# Patient Record
Sex: Female | Born: 1935 | Race: Black or African American | Hispanic: No | Smoking: Never smoker
Health system: Southern US, Community
[De-identification: ages and names within clinical notes are randomized; demographics above are authoritative.]

## PROBLEM LIST (undated history)

## (undated) DIAGNOSIS — I10 Essential (primary) hypertension: Secondary | ICD-10-CM

## (undated) DIAGNOSIS — E119 Type 2 diabetes mellitus without complications: Secondary | ICD-10-CM

## (undated) DIAGNOSIS — F0390 Unspecified dementia without behavioral disturbance: Secondary | ICD-10-CM

## (undated) DIAGNOSIS — J449 Chronic obstructive pulmonary disease, unspecified: Secondary | ICD-10-CM

## (undated) SURGERY — EGD (ESOPHAGOGASTRODUODENOSCOPY)
Anesthesia: General

---

## 2011-05-24 ENCOUNTER — Emergency Department (HOSPITAL_COMMUNITY): Payer: Medicare Other

## 2011-05-24 ENCOUNTER — Emergency Department (HOSPITAL_COMMUNITY)
Admission: EM | Admit: 2011-05-24 | Discharge: 2011-05-25 | Disposition: A | Discharge status: Home / Self Care | Payer: Medicare Other | Attending: Emergency Medicine | Admitting: Emergency Medicine

## 2011-05-24 DIAGNOSIS — F29 Unspecified psychosis not due to a substance or known physiological condition: Secondary | ICD-10-CM | POA: Insufficient documentation

## 2011-05-24 DIAGNOSIS — IMO0002 Reserved for concepts with insufficient information to code with codable children: Secondary | ICD-10-CM | POA: Insufficient documentation

## 2011-05-24 DIAGNOSIS — F0390 Unspecified dementia without behavioral disturbance: Secondary | ICD-10-CM | POA: Insufficient documentation

## 2011-05-24 DIAGNOSIS — H5316 Psychophysical visual disturbances: Secondary | ICD-10-CM | POA: Insufficient documentation

## 2011-05-25 LAB — CBC
Platelets: 218 10*3/uL (ref 150–400)
RBC: 4.46 MIL/uL (ref 3.87–5.11)
RDW: 14 % (ref 11.5–15.5)
WBC: 6.7 10*3/uL (ref 4.0–10.5)

## 2011-05-25 LAB — RAPID URINE DRUG SCREEN, HOSP PERFORMED
Amphetamines: NOT DETECTED
Barbiturates: NOT DETECTED
Benzodiazepines: NOT DETECTED
Cocaine: NOT DETECTED
Opiates: NOT DETECTED
Tetrahydrocannabinol: NOT DETECTED

## 2011-05-25 LAB — DIFFERENTIAL
Basophils Absolute: 0 10*3/uL (ref 0.0–0.1)
Basophils Relative: 0 % (ref 0–1)
Eosinophils Absolute: 0.1 10*3/uL (ref 0.0–0.7)
Eosinophils Relative: 1 % (ref 0–5)
Lymphocytes Relative: 21 % (ref 12–46)
Lymphs Abs: 1.4 10*3/uL (ref 0.7–4.0)
Monocytes Absolute: 0.7 10*3/uL (ref 0.1–1.0)
Monocytes Relative: 11 % (ref 3–12)
Neutro Abs: 4.5 10*3/uL (ref 1.7–7.7)
Neutrophils Relative %: 67 % (ref 43–77)

## 2011-05-25 LAB — COMPREHENSIVE METABOLIC PANEL
ALT: 30 U/L (ref 0–35)
AST: 30 U/L (ref 0–37)
Albumin: 3.4 g/dL — ABNORMAL LOW (ref 3.5–5.2)
CO2: 28 mEq/L (ref 19–32)
Calcium: 9.3 mg/dL (ref 8.4–10.5)
Chloride: 107 mEq/L (ref 96–112)
GFR calc non Af Amer: >60 mL/min (ref >60)
Sodium: 142 mEq/L (ref 135–145)
Total Bilirubin: 0.1 mg/dL — ABNORMAL LOW (ref 0.3–1.2)

## 2011-05-25 LAB — URINALYSIS, ROUTINE W REFLEX MICROSCOPIC
Bilirubin Urine: NEGATIVE
Glucose, UA: NEGATIVE mg/dL
Hgb urine dipstick: NEGATIVE
Ketones, ur: NEGATIVE mg/dL
Leukocytes, UA: NEGATIVE
Nitrite: NEGATIVE
Protein, ur: NEGATIVE mg/dL
Specific Gravity, Urine: 1.013 (ref 1.005–1.030)
Urobilinogen, UA: 0.2 mg/dL (ref 0.0–1.0)
pH: 7.5 (ref 5.0–8.0)

## 2011-05-25 LAB — ETHANOL: Alcohol, Ethyl (B): <11 mg/dL (ref 0–11)

## 2011-09-20 ENCOUNTER — Emergency Department: Payer: Self-pay | Admitting: *Deleted

## 2011-09-20 LAB — COMPREHENSIVE METABOLIC PANEL
Albumin: 3.3 g/dL — ABNORMAL LOW (ref 3.4–5.0)
Anion Gap: 9 (ref 7–16)
Bilirubin,Total: 0.3 mg/dL (ref 0.2–1.0)
Creatinine: 0.72 mg/dL (ref 0.60–1.30)
EGFR (African American): >60
Potassium: 3.6 mmol/L (ref 3.5–5.1)
SGOT(AST): 29 U/L (ref 15–37)
SGPT (ALT): 19 U/L
Sodium: 144 mmol/L (ref 136–145)
Total Protein: 6.6 g/dL (ref 6.4–8.2)

## 2011-09-20 LAB — ACETAMINOPHEN LEVEL: Acetaminophen: <2 ug/mL

## 2011-09-20 LAB — SALICYLATE LEVEL: Salicylates, Serum: 2.3 mg/dL

## 2011-09-20 LAB — DRUG SCREEN, URINE
Barbiturates, Ur Screen: NEGATIVE (ref ?–200)
Cannabinoid 50 Ng, Ur ~~LOC~~: NEGATIVE (ref ?–50)
Phencyclidine (PCP) Ur S: NEGATIVE (ref ?–25)

## 2011-09-20 LAB — URINALYSIS, COMPLETE
Ketone: NEGATIVE
Protein: NEGATIVE
Specific Gravity: 1.013 (ref 1.003–1.030)
WBC UR: 8 /HPF (ref 0–5)

## 2011-09-20 LAB — CBC
HCT: 41.4 % (ref 35.0–47.0)
HGB: 13.9 g/dL (ref 12.0–16.0)
MCH: 28.5 pg (ref 26.0–34.0)
MCV: 85 fL (ref 80–100)
RBC: 4.87 10*6/uL (ref 3.80–5.20)

## 2011-09-20 LAB — TSH: Thyroid Stimulating Horm: 2.31 u[IU]/mL

## 2013-01-07 DIAGNOSIS — R350 Frequency of micturition: Secondary | ICD-10-CM | POA: Insufficient documentation

## 2013-04-05 ENCOUNTER — Emergency Department: Payer: Self-pay | Admitting: Emergency Medicine

## 2013-04-05 LAB — URINALYSIS, COMPLETE
Blood: NEGATIVE
Nitrite: NEGATIVE
WBC UR: 3 /HPF (ref 0–5)

## 2013-04-05 LAB — CBC
HGB: 13.6 g/dL (ref 12.0–16.0)
MCHC: 34.2 g/dL (ref 32.0–36.0)
MCV: 82 fL (ref 80–100)
WBC: 7.4 10*3/uL (ref 3.6–11.0)

## 2013-04-05 LAB — TROPONIN I: Troponin-I: <0.02 ng/mL

## 2013-04-05 LAB — BASIC METABOLIC PANEL
Anion Gap: 5 — ABNORMAL LOW (ref 7–16)
BUN: 11 mg/dL (ref 7–18)
Calcium, Total: 8.7 mg/dL (ref 8.5–10.1)
Co2: 29 mmol/L (ref 21–32)
EGFR (African American): >60
EGFR (Non-African Amer.): >60
Glucose: 90 mg/dL (ref 65–99)
Sodium: 141 mmol/L (ref 136–145)

## 2014-02-02 ENCOUNTER — Emergency Department: Payer: Self-pay | Admitting: Emergency Medicine

## 2014-02-02 LAB — URINALYSIS, COMPLETE
BILIRUBIN, UR: NEGATIVE
BLOOD: NEGATIVE
GLUCOSE, UR: NEGATIVE mg/dL (ref 0–75)
KETONE: NEGATIVE
Leukocyte Esterase: NEGATIVE
NITRITE: NEGATIVE
PH: 8 (ref 4.5–8.0)
PROTEIN: NEGATIVE
RBC,UR: <1 /HPF (ref 0–5)
SPECIFIC GRAVITY: 1.004 (ref 1.003–1.030)
WBC UR: <1 /HPF (ref 0–5)

## 2014-02-02 LAB — TROPONIN I

## 2014-02-02 LAB — COMPREHENSIVE METABOLIC PANEL
ALK PHOS: 116 U/L
ALT: 22 U/L (ref 12–78)
ANION GAP: 8 (ref 7–16)
Albumin: 3.7 g/dL (ref 3.4–5.0)
BILIRUBIN TOTAL: 0.3 mg/dL (ref 0.2–1.0)
BUN: 11 mg/dL (ref 7–18)
CHLORIDE: 107 mmol/L (ref 98–107)
Calcium, Total: 9.2 mg/dL (ref 8.5–10.1)
Co2: 27 mmol/L (ref 21–32)
Creatinine: 1 mg/dL (ref 0.60–1.30)
EGFR (African American): >60
EGFR (Non-African Amer.): 53 — ABNORMAL LOW
GLUCOSE: 91 mg/dL (ref 65–99)
OSMOLALITY: 282 (ref 275–301)
Potassium: 4 mmol/L (ref 3.5–5.1)
SGOT(AST): 32 U/L (ref 15–37)
SODIUM: 142 mmol/L (ref 136–145)
TOTAL PROTEIN: 7.4 g/dL (ref 6.4–8.2)

## 2014-02-02 LAB — CBC
HCT: 43.6 % (ref 35.0–47.0)
HGB: 14.1 g/dL (ref 12.0–16.0)
MCH: 27.2 pg (ref 26.0–34.0)
MCHC: 32.3 g/dL (ref 32.0–36.0)
MCV: 84 fL (ref 80–100)
PLATELETS: 527 10*3/uL — AB (ref 150–440)
RBC: 5.19 10*6/uL (ref 3.80–5.20)
RDW: 15.1 % — ABNORMAL HIGH (ref 11.5–14.5)
WBC: 6.5 10*3/uL (ref 3.6–11.0)

## 2014-02-02 LAB — LIPASE, BLOOD: LIPASE: 141 U/L (ref 73–393)

## 2014-02-06 DIAGNOSIS — E119 Type 2 diabetes mellitus without complications: Secondary | ICD-10-CM | POA: Insufficient documentation

## 2014-02-06 DIAGNOSIS — F0390 Unspecified dementia without behavioral disturbance: Secondary | ICD-10-CM | POA: Insufficient documentation

## 2014-05-31 ENCOUNTER — Emergency Department: Payer: Self-pay | Admitting: Emergency Medicine

## 2014-05-31 LAB — URINALYSIS, COMPLETE
BACTERIA: NONE SEEN
BILIRUBIN, UR: NEGATIVE
BLOOD: NEGATIVE
GLUCOSE, UR: NEGATIVE mg/dL (ref 0–75)
KETONE: NEGATIVE
Leukocyte Esterase: NEGATIVE
Nitrite: NEGATIVE
PH: 6 (ref 4.5–8.0)
PROTEIN: NEGATIVE
SPECIFIC GRAVITY: 1.006 (ref 1.003–1.030)
WBC UR: 2 /HPF (ref 0–5)

## 2014-05-31 LAB — COMPREHENSIVE METABOLIC PANEL
ALK PHOS: 137 U/L — AB
ALT: 29 U/L
ANION GAP: 5 — AB (ref 7–16)
Albumin: 3.1 g/dL — ABNORMAL LOW (ref 3.4–5.0)
BUN: 12 mg/dL (ref 7–18)
Bilirubin,Total: 0.2 mg/dL (ref 0.2–1.0)
CO2: 29 mmol/L (ref 21–32)
Calcium, Total: 8.5 mg/dL (ref 8.5–10.1)
Chloride: 107 mmol/L (ref 98–107)
Creatinine: 1.07 mg/dL (ref 0.60–1.30)
EGFR (African American): >60
GFR CALC NON AF AMER: 52 — AB
Glucose: 100 mg/dL — ABNORMAL HIGH (ref 65–99)
Osmolality: 281 (ref 275–301)
POTASSIUM: 3.9 mmol/L (ref 3.5–5.1)
SGOT(AST): 31 U/L (ref 15–37)
Sodium: 141 mmol/L (ref 136–145)
TOTAL PROTEIN: 7 g/dL (ref 6.4–8.2)

## 2014-05-31 LAB — CBC
HCT: 42 % (ref 35.0–47.0)
HGB: 13.2 g/dL (ref 12.0–16.0)
MCH: 27 pg (ref 26.0–34.0)
MCHC: 31.6 g/dL — AB (ref 32.0–36.0)
MCV: 86 fL (ref 80–100)
Platelet: 498 10*3/uL — ABNORMAL HIGH (ref 150–440)
RBC: 4.91 10*6/uL (ref 3.80–5.20)
RDW: 14.9 % — ABNORMAL HIGH (ref 11.5–14.5)
WBC: 7 10*3/uL (ref 3.6–11.0)

## 2014-05-31 LAB — TSH: THYROID STIMULATING HORM: 0.881 u[IU]/mL

## 2014-05-31 LAB — TROPONIN I

## 2014-10-11 ENCOUNTER — Emergency Department: Payer: Self-pay | Admitting: Emergency Medicine

## 2014-12-21 NOTE — Consult Note (Signed)
PATIENT NAME:  Savannah Young, Savannah Young MR#:  [id number] DATE OF BIRTH:  [birth year]  DATE OF CONSULTATION:  09/21/2011  REFERRING PHYSICIAN:   CONSULTING PHYSICIAN:  Drue Stager. Wyolene Weimann, MD  ADDENDUM: Continuation of dictation.  PAST MEDICAL HISTORY: Usual childhood illnesses; however, there is some concern about the patient's capacity to provide history.   LABS/STUDIES: Chest PA and lateral: Chronic obstructive pulmonary disease.    She is undergoing urinalysis, cultures and sensitivities.  Urine drug screen is negative. TSH is normal. Aspirin is negative. WBC is negative. Ethanol is negative. Complete metabolic panel is unremarkable.   Tylenol is negative.   REVIEW OF SYSTEMS: Constitutional, head, eyes, ears, nose, throat, mouth, neurologic, psychiatric, cardiovascular, respiratory, gastrointestinal, genitourinary, skin, musculoskeletal, hematologic, lymphatic, endocrine, and metabolic are all unremarkable.   PHYSICAL EXAMINATION: Savannah Young is an elderly female sitting up in her hospital chair with no abnormal involuntary movements.  VITAL SIGNS: Temperature 96, pulse 98, respiratory rate 18, blood pressure 124/73.   GENERAL APPEARANCE: Well-developed, well-nourished elderly female with normal body habitus. No deformities. Good attention to grooming. Her skeletal tone is normal. The gait station is normal.   Attention span is mildly decreased. Concentration is mildly decreased. Eye contact is intermittent. She has difficulty with orientation. She does not know where she is. She does not the year or the month. She is oriented to person. On memory testing, she cannot recall the year or the month which is consistent with her orientation difficulty. Her speech does involve normal rate, volume, articulation, and prosody. Her fund of knowledge, use of language, and intelligence are below that of normal. Her mood and affect are mildly irritated with some guarding regarding the undersigned's motive for  questioning. Thought process is of normal rate. She does have coherence, however, there is alogia regarding the hallucinations. She has no tangents. Abstraction is questionable. On thought content, she has the delusional system as described above. She has no thoughts of harming herself or others. Her insight is poor. Judgment is impaired. Mood is irritable. Affect is guarded.  ASSESSMENT:   AXIS I:  1. Psychotic disorder not otherwise specified. This could be due to dementia which would be the code of 293.81 2. Dementia not otherwise specified.   My recommendation is we will pursue a basic general medical reversible central nervous system workup to include RPR, folic acid, and possible additional imaging.   However, I would emphasize that a standing antipsychotic will resolve the acute symptoms potentially and would start Zyprexa at 5 mg p.o. at bedtime.   AXIS II: Deferred.   AXIS III: See past medical history.   AXIS IV: General, medical, primary support group.   AXIS V: 88.   Given Savannah Young's psychosis which impairs her judgment, she would be at risk for lethal passive self neglect.   Therefore, she requires admission to a psychiatric hospital.   We will initiate Zyprexa 5 mg daily for antipsychosis.       With her limited memory, milieu and group psychotherapy will be attempted.   Anticipate movement to an assisted living facility for the demented once the psychosis is ameliorated.  ____________________________ Drue Stager. Parisa Pinela, MD jsw:slb D: 09/21/2011 23:38:45 ET T: 09/22/2011 10:46:10 ET JOB#: [id number]  cc: Drue Stager. Hazelgrace Bonham, MD, <Dictator> Billie Ruddy MD ELECTRONICALLY SIGNED 09/23/2011 19:28

## 2014-12-21 NOTE — Consult Note (Signed)
PATIENT NAME:  Savannah Young, Savannah Young MR#:  [id number] DATE OF BIRTH:  [birth year]  DATE OF CONSULTATION:  09/21/2011  REFERRING PHYSICIAN:  Robb Matar, MD  CONSULTING PHYSICIAN:  Drue Stager. Manley, MD  CHIEF COMPLAINT AND REASON FOR CONSULTATION: Psychosis.   HISTORY OF PRESENT ILLNESS: Savannah Young is a 81 year old female who presents with the chief complaint communicated by other personnel that she is having difficulty with memory and psychosis.   For approximately eight weeks, she has developed a progressive bizarre systematic delusion that involves the next door neighbors putting cameras extensively throughout her house that are controlled by a computer. She states that these cameras have even infiltrated her bathtub. She reported that she saw two young men in her kitchen. She was brought to the Emergency Room for further evaluation and treatment. There are no known precipitating stresses, organic or otherwise. She was not on any known psychotropic medications and there is no prior history of psychosis.   The psychotic syndrome has progressed and has resulted in concomitant anxiety as well as anger, however, there are no thoughts of harming herself or others.    Savannah Young was brought to the Emergency Department via the [facility] after her family was forced to take IVC papers out on her due to the hallucinations and hostility that was occurring at home. The patient's version was that she called police regarding the two men she had seen in her kitchen. She is a poor historian, however, she is redirectable back to her Emergency Department room and bathroom.   PAST PSYCHIATRIC HISTORY: There is no record of any mental difficulty and Savannah Young denies any history of psychiatric disturbances such as elevated mood, racing thoughts, increased energy, or severe depression. She denies any history of being psychiatrically hospitalized or undergoing psychotropic medication. No prior mood  disturbances. No history of elevated mood. No history of psychotic symptoms such as hallucinations or delusions. The patient does have a history of progressive short-term and recall memory.   FAMILY PSYCHIATRIC HISTORY: None known.  SOCIAL HISTORY: Savannah Young is from [place], [place]. 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 She never had children but married a very supportive husband. She is retired as an Building control surveyor. She did not have children but raised her niece. She rarely drinks alcohol, only on a supportive specification and drinks very little in the form of wine.     ____________________________ Drue Stager. Burnis Halling, MD jsw:drc D: 09/21/2011 23:17:51 ET T: 09/22/2011 10:12:51 ET JOB#: [id number]  cc: Drue Stager. Brianna Esson, MD, <Dictator> Billie Ruddy MD ELECTRONICALLY SIGNED 09/23/2011 19:28

## 2015-02-06 DIAGNOSIS — N3281 Overactive bladder: Secondary | ICD-10-CM | POA: Insufficient documentation

## 2016-01-20 ENCOUNTER — Emergency Department: Payer: Medicare Other

## 2016-01-20 ENCOUNTER — Inpatient Hospital Stay
Admission: EM | Admit: 2016-01-20 | Discharge: 2016-01-21 | DRG: 057 | Disposition: A | Discharge status: Home / Self Care | Payer: Medicare Other | Attending: Internal Medicine | Admitting: Internal Medicine

## 2016-01-20 DIAGNOSIS — R7989 Other specified abnormal findings of blood chemistry: Secondary | ICD-10-CM

## 2016-01-20 DIAGNOSIS — G309 Alzheimer's disease, unspecified: Secondary | ICD-10-CM | POA: Diagnosis present

## 2016-01-20 DIAGNOSIS — E876 Hypokalemia: Secondary | ICD-10-CM | POA: Diagnosis present

## 2016-01-20 DIAGNOSIS — R778 Other specified abnormalities of plasma proteins: Secondary | ICD-10-CM

## 2016-01-20 DIAGNOSIS — Z79899 Other long term (current) drug therapy: Secondary | ICD-10-CM | POA: Diagnosis not present

## 2016-01-20 DIAGNOSIS — R4182 Altered mental status, unspecified: Secondary | ICD-10-CM | POA: Diagnosis present

## 2016-01-20 DIAGNOSIS — D75839 Thrombocytosis, unspecified: Secondary | ICD-10-CM

## 2016-01-20 DIAGNOSIS — F0280 Dementia in other diseases classified elsewhere without behavioral disturbance: Secondary | ICD-10-CM | POA: Diagnosis present

## 2016-01-20 DIAGNOSIS — I10 Essential (primary) hypertension: Secondary | ICD-10-CM | POA: Diagnosis present

## 2016-01-20 DIAGNOSIS — J449 Chronic obstructive pulmonary disease, unspecified: Secondary | ICD-10-CM | POA: Diagnosis present

## 2016-01-20 DIAGNOSIS — E119 Type 2 diabetes mellitus without complications: Secondary | ICD-10-CM | POA: Diagnosis present

## 2016-01-20 DIAGNOSIS — F0391 Unspecified dementia with behavioral disturbance: Secondary | ICD-10-CM

## 2016-01-20 DIAGNOSIS — D473 Essential (hemorrhagic) thrombocythemia: Secondary | ICD-10-CM

## 2016-01-20 DIAGNOSIS — R748 Abnormal levels of other serum enzymes: Secondary | ICD-10-CM | POA: Diagnosis present

## 2016-01-20 DIAGNOSIS — D7589 Other specified diseases of blood and blood-forming organs: Secondary | ICD-10-CM | POA: Diagnosis present

## 2016-01-20 DIAGNOSIS — R451 Restlessness and agitation: Secondary | ICD-10-CM | POA: Diagnosis present

## 2016-01-20 HISTORY — DX: Essential (primary) hypertension: I10

## 2016-01-20 HISTORY — DX: Unspecified dementia, unspecified severity, without behavioral disturbance, psychotic disturbance, mood disturbance, and anxiety: F03.90

## 2016-01-20 HISTORY — DX: Type 2 diabetes mellitus without complications: E11.9

## 2016-01-20 HISTORY — DX: Chronic obstructive pulmonary disease, unspecified: J44.9

## 2016-01-20 LAB — CBC WITH DIFFERENTIAL/PLATELET
BASOS PCT: 1 %
Basophils Absolute: 0.1 10*3/uL (ref 0–0.1)
Eosinophils Absolute: 0 10*3/uL (ref 0–0.7)
Eosinophils Relative: 1 %
HEMATOCRIT: 38.7 % (ref 35.0–47.0)
HEMOGLOBIN: 13 g/dL (ref 12.0–16.0)
LYMPHS ABS: 1.6 10*3/uL (ref 1.0–3.6)
Lymphocytes Relative: 24 %
MCH: 27.1 pg (ref 26.0–34.0)
MCHC: 33.6 g/dL (ref 32.0–36.0)
MCV: 80.6 fL (ref 80.0–100.0)
MONOS PCT: 9 %
Monocytes Absolute: 0.6 10*3/uL (ref 0.2–0.9)
NEUTROS ABS: 4.3 10*3/uL (ref 1.4–6.5)
NEUTROS PCT: 65 %
Platelets: 828 10*3/uL — ABNORMAL HIGH (ref 150–440)
RBC: 4.81 MIL/uL (ref 3.80–5.20)
RDW: 15.4 % — ABNORMAL HIGH (ref 11.5–14.5)
WBC: 6.6 10*3/uL (ref 3.6–11.0)

## 2016-01-20 LAB — COMPREHENSIVE METABOLIC PANEL
ALBUMIN: 3.8 g/dL (ref 3.5–5.0)
ALK PHOS: 101 U/L (ref 38–126)
ALT: 16 U/L (ref 14–54)
ANION GAP: 9 (ref 5–15)
AST: 26 U/L (ref 15–41)
BILIRUBIN TOTAL: 0.2 mg/dL — AB (ref 0.3–1.2)
BUN: 11 mg/dL (ref 6–20)
CALCIUM: 9.1 mg/dL (ref 8.9–10.3)
CO2: 25 mmol/L (ref 22–32)
CREATININE: 0.88 mg/dL (ref 0.44–1.00)
Chloride: 107 mmol/L (ref 101–111)
GFR calc Af Amer: >60 mL/min (ref >60)
GFR calc non Af Amer: 60 mL/min — ABNORMAL LOW (ref >60)
GLUCOSE: 101 mg/dL — AB (ref 65–99)
Potassium: 3.8 mmol/L (ref 3.5–5.1)
SODIUM: 141 mmol/L (ref 135–145)
TOTAL PROTEIN: 6.7 g/dL (ref 6.5–8.1)

## 2016-01-20 LAB — MRSA PCR SCREENING: MRSA by PCR: NEGATIVE

## 2016-01-20 LAB — TROPONIN I
Troponin I: 0.25 ng/mL — ABNORMAL HIGH (ref <0.031)
Troponin I: <0.03 ng/mL (ref <0.031)

## 2016-01-20 LAB — TSH: TSH: 1.249 u[IU]/mL (ref 0.350–4.500)

## 2016-01-20 LAB — CK: Total CK: 129 U/L (ref 38–234)

## 2016-01-20 MED ORDER — MEMANTINE HCL ER 14 MG PO CP24
28.0000 mg | ORAL_CAPSULE | Freq: Every day | ORAL | Status: DC
  Administered 2016-01-20: 28 mg via ORAL
  Filled 2016-01-20: qty 1
  Filled 2016-01-20: qty 2

## 2016-01-20 MED ORDER — LORAZEPAM 1 MG PO TABS
1.0000 mg | ORAL_TABLET | Freq: Once | ORAL | Status: AC
  Administered 2016-01-20: 1 mg via ORAL
  Filled 2016-01-20: qty 1

## 2016-01-20 MED ORDER — LORATADINE 10 MG PO TABS
10.0000 mg | ORAL_TABLET | Freq: Every day | ORAL | Status: DC
  Administered 2016-01-21: 10 mg via ORAL
  Filled 2016-01-20: qty 1

## 2016-01-20 MED ORDER — HALOPERIDOL LACTATE 5 MG/ML IJ SOLN
2.0000 mg | Freq: Once | INTRAMUSCULAR | Status: AC
  Administered 2016-01-20: 2 mg via INTRAVENOUS
  Filled 2016-01-20: qty 1

## 2016-01-20 MED ORDER — LORAZEPAM 0.5 MG PO TABS
0.5000 mg | ORAL_TABLET | Freq: Every day | ORAL | Status: DC
  Administered 2016-01-20: 0.5 mg via ORAL
  Filled 2016-01-20: qty 1

## 2016-01-20 MED ORDER — ONDANSETRON HCL 4 MG PO TABS: 4.0000 mg | ORAL_TABLET | Freq: Four times a day (QID) | ORAL | Status: DC | PRN

## 2016-01-20 MED ORDER — SODIUM CHLORIDE 0.9 % IV SOLN
INTRAVENOUS | Status: DC
  Administered 2016-01-20: 20:00:00 via INTRAVENOUS

## 2016-01-20 MED ORDER — PERPHENAZINE 4 MG PO TABS
4.0000 mg | ORAL_TABLET | Freq: Every day | ORAL | Status: DC
  Administered 2016-01-20: 4 mg via ORAL
  Filled 2016-01-20 (×2): qty 1

## 2016-01-20 MED ORDER — ENOXAPARIN SODIUM 40 MG/0.4ML ~~LOC~~ SOLN
40.0000 mg | SUBCUTANEOUS | Status: DC
  Administered 2016-01-20: 40 mg via SUBCUTANEOUS
  Filled 2016-01-20: qty 0.4

## 2016-01-20 MED ORDER — CRANBERRY 425 MG PO CAPS: 425.0000 mg | ORAL_CAPSULE | Freq: Every day | ORAL | Status: DC

## 2016-01-20 MED ORDER — ACETAMINOPHEN 500 MG PO TABS: 500.0000 mg | ORAL_TABLET | Freq: Three times a day (TID) | ORAL | Status: DC | PRN

## 2016-01-20 MED ORDER — ONDANSETRON HCL 4 MG/2ML IJ SOLN: 4.0000 mg | Freq: Four times a day (QID) | INTRAMUSCULAR | Status: DC | PRN

## 2016-01-20 MED ORDER — GUAIFENESIN 100 MG/5ML PO SYRP
200.0000 mg | ORAL_SOLUTION | Freq: Three times a day (TID) | ORAL | Status: DC | PRN
  Filled 2016-01-20: qty 10

## 2016-01-20 MED ORDER — DONEPEZIL HCL 5 MG PO TABS
10.0000 mg | ORAL_TABLET | Freq: Every day | ORAL | Status: DC
  Administered 2016-01-20: 10 mg via ORAL
  Filled 2016-01-20: qty 2

## 2016-01-20 MED ORDER — DIPHENHYDRAMINE HCL 25 MG PO CAPS: 25.0000 mg | ORAL_CAPSULE | ORAL | Status: DC | PRN

## 2016-01-20 MED ORDER — ENOXAPARIN SODIUM 80 MG/0.8ML ~~LOC~~ SOLN
1.0000 mg/kg | Freq: Once | SUBCUTANEOUS | Status: AC
  Administered 2016-01-20: 65 mg via SUBCUTANEOUS
  Filled 2016-01-20: qty 0.8

## 2016-01-20 MED ORDER — DOCUSATE SODIUM 100 MG PO CAPS
100.0000 mg | ORAL_CAPSULE | Freq: Two times a day (BID) | ORAL | Status: DC
  Administered 2016-01-20: 100 mg via ORAL
  Filled 2016-01-20: qty 1

## 2016-01-20 MED ORDER — RISPERIDONE 0.5 MG PO TBDP
0.5000 mg | ORAL_TABLET | Freq: Two times a day (BID) | ORAL | Status: DC | PRN
  Filled 2016-01-20: qty 1

## 2016-01-20 MED ORDER — SODIUM CHLORIDE 0.9 % IV SOLN
Freq: Once | INTRAVENOUS | Status: AC
  Administered 2016-01-20: 18:00:00 via INTRAVENOUS

## 2016-01-20 MED ORDER — VITAMIN D 1000 UNITS PO TABS
2000.0000 [IU] | ORAL_TABLET | Freq: Every day | ORAL | Status: DC
  Administered 2016-01-21: 2000 [IU] via ORAL
  Filled 2016-01-20: qty 2

## 2016-01-20 MED ORDER — ASPIRIN 81 MG PO CHEW
324.0000 mg | CHEWABLE_TABLET | Freq: Once | ORAL | Status: AC
  Administered 2016-01-20: 324 mg via ORAL
  Filled 2016-01-20: qty 4

## 2016-01-20 MED ORDER — VITAMIN B-12 1000 MCG PO TABS
500.0000 ug | ORAL_TABLET | Freq: Every day | ORAL | Status: DC
  Administered 2016-01-21: 500 ug via ORAL
  Filled 2016-01-20: qty 1

## 2016-01-20 NOTE — Progress Notes (Signed)
Spoke with patient family Ms. Worth. Updated on plan of care. Verbalized understanding. Family requested MD to review patient platelet levels. Will pass information on. Patient becoming impulsive and agitated. MD notified. New orders placed. Medication given. Safety sitter now present. Will continue to monitor.

## 2016-01-20 NOTE — ED Provider Notes (Addendum)
[facility] Emergency Department Provider Note        Time seen: ----------------------------------------- 3:57 PM on 01/20/2016 -----------------------------------------    I have reviewed the triage vital signs and the nursing notes.   HISTORY  Chief Complaint Altered Mental Status    HPI Savannah Young is a 82 y.o. female who presents to ER for evaluation of combative behavior. Patient is in the dementia unit at [facility]. She just finished antibiotics for UTI. Staff reports patient is at a baseline dementia but she's never had combative behavior before. She is alert and oriented to person but disoriented to time and place. She denies any complaints currently.   Past Medical History  Diagnosis Date  . Dementia     There are no active problems to display for this patient.   History reviewed. No pertinent past surgical history.  Allergies Review of patient's allergies indicates not on file.  Social History Social History  Substance Use Topics  . Smoking status: Never Smoker   . Smokeless tobacco: None  . Alcohol Use: No    Review of Systems Unknown, reported combative behavior ____________________________________________   PHYSICAL EXAM:  VITAL SIGNS: ED Triage Vitals  Enc Vitals Group     BP 01/20/16 1553 152/70 mmHg     Pulse Rate 01/20/16 1553 85     Resp 01/20/16 1553 18     Temp 01/20/16 1553 98.2 F (36.8 C)     Temp Source 01/20/16 1553 Oral     SpO2 01/20/16 1553 98 %     Weight 01/20/16 1553 140 lb (63.504 kg)     Height --      Head Cir --      Peak Flow --      Pain Score --      Pain Loc --      Pain Edu? --      Excl. in [place]? --     Constitutional: Alert But disoriented. No acute distress Eyes: Conjunctivae are normal. Normal extraocular movements. ENT   Head: Normocephalic and atraumatic.   Nose: No congestion/rhinnorhea.   Mouth/Throat: Mucous membranes are moist.    Neck: No stridor. Cardiovascular: Normal rate, regular rhythm. No murmurs, rubs, or gallops. Respiratory: Normal respiratory effort without tachypnea nor retractions. Breath sounds are clear and equal bilaterally. No wheezes/rales/rhonchi. Gastrointestinal: Soft and nontender. Normal bowel sounds Musculoskeletal: Nontender with normal range of motion in all extremities. No lower extremity tenderness nor edema. Neurologic:  Normal speech and language, but patient does exhibit confusion. No gross focal neurologic deficits are appreciated.  Skin:  Skin is warm, dry and intact. No rash noted. ____________________________________________  EKG: Interpreted by me.normal sinus rhythm with sinus arrhythmia, rate is 71 bpm, normal PR interval, normal QRS,normal QT interval. Normaxis.  ____________________________________________  ED COURSE:  Pertinent labs & imaging results that were available during my care of the patient were reviewed by me and considered in my medical decision making (see chart for details). Patient presents to the ER with altered mental status. We will check basic labs and reevaluate. ____________________________________________    LABS (pertinent positives/negatives)  Labs Reviewed  CBC WITH DIFFERENTIAL/PLATELET - Abnormal; Notable for the following:    RDW 15.4 (*)    Platelets 828 (*)    All other components within normal limits  COMPREHENSIVE METABOLIC PANEL - Abnormal; Notable for the following:    Glucose, Bld 101 (*)    Total Bilirubin 0.2 (*)    GFR calc  non Af Amer 60 (*)    All other components within normal limits  TROPONIN I - Abnormal; Notable for the following:    Troponin I 0.25 (*)    All other components within normal limits  URINALYSIS COMPLETEWITH MICROSCOPIC ([facility] ONLY)   CRITICAL CARE Performed by: Earleen Newport   Total critical care time: 30 minutes  Critical care time was exclusive of separately billable procedures and treating  other patients.  Critical care was necessary to treat or prevent imminent or life-threatening deterioration.  Critical care was time spent personally by me on the following activities: development of treatment plan with patient and/or surrogate as well as nursing, discussions with consultants, evaluation of patient's response to treatment, examination of patient, obtaining history from patient or surrogate, ordering and performing treatments and interventions, ordering and review of laboratory studies, ordering and review of radiographic studies, pulse oximetry and re-evaluation of patient's condition.   RADIOLOGY Images were viewed by me  CT head, chest x-ray  IMPRESSION: 1. No evidence of acute intracranial abnormality. 2. Generalized cerebral volume loss and mild chronic small vessel Ischemia. IMPRESSION: Mild scarring versus atelectasis at the left lung base. Otherwise no active disease in the chest. ____________________________________________  FINAL ASSESSMENT AND PLAN  Elevated troponin, dementia, thrombocytosis  Plan: Patient with labs and imaging as dictated above. Patient presents the ER for change in behavior. We have discovered an elevated troponin of 0.25as well as elevated platelet levels. I ordered aspirin and Lovenox for her and given a saline bolus for the elevated platelet levels. She is not complaining of any chest pain currently, I'll recommend observation and serial troponins as well as a recheck of her platelets.   Earleen Newport, MD   Note: This dictation was prepared with Dragon dictation. Any transcriptional errors that result from this process are unintentional   Earleen Newport, MD 01/20/16 1719  Earleen Newport, MD 01/20/16 [phone]

## 2016-01-20 NOTE — H&P (Signed)
Savannah Young at [place] NAME: Savannah Young    MR#:  [id number]  DATE OF BIRTH:  Mar 16, 1932  DATE OF ADMISSION:  01/20/2016  PRIMARY CARE PHYSICIAN: No primary care provider on file.   REQUESTING/REFERRING PHYSICIAN: Dr. Lenise Arena  CHIEF COMPLAINT:   Chief Complaint  Patient presents with  . Altered Mental Status    HISTORY OF PRESENT ILLNESS:  Savannah Young  is a 82 y.o. female with a known history of Alzheimer's dementia, hypertension and allergies presents from assisted living facility secondary to worsening agitation. No history obtained from patient due to her dementia. Most of the history is obtained from ER physician and also old records. Apparently patient at baseline is ambulatory, confused but not agitated. She has been very combative today and so was sent to the emergency room. No recorded fever, no nausea or vomiting. Patient is currently confused, in bed but not combative. She was recently treated with Bactrim for urinary tract infection. Repeat urinalysis is pending at this time. Her troponin is elevated at 0.25. She is being admitted for further monitoring.  PAST MEDICAL HISTORY:   Past Medical History  Diagnosis Date  . Dementia   . Hypertension   . Diabetes mellitus ([place])   . COPD (chronic obstructive pulmonary disease) ([place])     PAST SURGICAL HISTORY:  History reviewed. No pertinent past surgical history.  No surgical history known  SOCIAL HISTORY:   Social History  Substance Use Topics  . Smoking status: Never Smoker   . Smokeless tobacco: Not on file  . Alcohol Use: No    FAMILY HISTORY:  No family history on file.  No known family history- not obtained due to patients dementia and no family available  DRUG ALLERGIES:  No Known Allergies  REVIEW OF SYSTEMS:   Review of Systems  Unable to perform ROS: dementia    MEDICATIONS AT HOME:   Prior to Admission medications   Medication  Sig Start Date End Date Taking? Authorizing Provider  acetaminophen (TYLENOL) 500 MG tablet Take 500 mg by mouth 3 (three) times daily as needed for mild pain.   Yes Historical Provider, MD  albuterol (PROVENTIL HFA;VENTOLIN HFA) 108 (90 Base) MCG/ACT inhaler Inhale 2 puffs into the lungs every 4 (four) hours as needed for wheezing or shortness of breath.   Yes Historical Provider, MD  cholecalciferol (VITAMIN D) 1000 units tablet Take 2,000 Units by mouth daily.   Yes Historical Provider, MD  Cranberry 425 MG CAPS Take 425 mg by mouth daily.   Yes Historical Provider, MD  cyanocobalamin 500 MCG tablet Take 500 mcg by mouth daily.   Yes Historical Provider, MD  diphenhydrAMINE (BENADRYL) 25 mg capsule Take 25 mg by mouth every 4 (four) hours as needed for itching.   Yes Historical Provider, MD  donepezil (ARICEPT) 10 MG tablet Take 10 mg by mouth at bedtime.   Yes Historical Provider, MD  fexofenadine (ALLEGRA) 180 MG tablet Take 180 mg by mouth daily.   Yes Historical Provider, MD  guaifenesin (ROBITUSSIN) 100 MG/5ML syrup Take 200 mg by mouth 3 (three) times daily as needed for cough.   Yes Historical Provider, MD  LORazepam (ATIVAN) 0.5 MG tablet Take 0.5 mg by mouth at bedtime. Pt is also able to take twice daily as needed for anxiety.   Yes Historical Provider, MD  memantine (NAMENDA XR) 28 MG CP24 24 hr capsule Take 28 mg by mouth at bedtime.   Yes  Historical Provider, MD  perphenazine (TRILAFON) 4 MG tablet Take 4 mg by mouth at bedtime.   Yes Historical Provider, MD      VITAL SIGNS:  Blood pressure 139/92, pulse 78, temperature 98.2 F (36.8 C), temperature source Oral, resp. rate 17, weight 63.504 kg (140 lb), last menstrual period 01/20/2016, SpO2 98 %.  PHYSICAL EXAMINATION:   Physical Exam  GENERAL:  82 y.o.-year-old patient lying in the bed with no acute distress.  EYES: Pupils equal, round, reactive to light and accommodation. No scleral icterus. Extraocular muscles intact.   HEENT: Head atraumatic, normocephalic. Oropharynx and nasopharynx clear.  NECK:  Supple, no jugular venous distention. No thyroid enlargement, no tenderness.  LUNGS: Normal breath sounds bilaterally, no wheezing, rales,rhonchi or crepitation. No use of accessory muscles of respiration.  CARDIOVASCULAR: S1, S2 normal. No rubs, or gallops. 2/6 systolic murmur present. ABDOMEN: Soft, nontender, nondistended. Bowel sounds present. No organomegaly or mass.  EXTREMITIES: No pedal edema, cyanosis, or clubbing.  NEUROLOGIC: Cranial nerves II through XII are intact. Muscle strength 5/5 in all extremities. Sensation intact. Gait not checked. Following commands  PSYCHIATRIC: The patient is alert and oriented to self . Pleasantly confused. SKIN: No obvious rash, lesion, or ulcer.   LABORATORY PANEL:   CBC  Recent Labs Lab 01/20/16 1630  WBC 6.6  HGB 13.0  HCT 38.7  PLT 828*   ------------------------------------------------------------------------------------------------------------------  Chemistries   Recent Labs Lab 01/20/16 1630  NA 141  K 3.8  CL 107  CO2 25  GLUCOSE 101*  BUN 11  CREATININE 0.88  CALCIUM 9.1  AST 26  ALT 16  ALKPHOS 101  BILITOT 0.2*   ------------------------------------------------------------------------------------------------------------------  Cardiac Enzymes  Recent Labs Lab 01/20/16 1630  TROPONINI 0.25*   ------------------------------------------------------------------------------------------------------------------  RADIOLOGY:  Dg Chest 2 View  01/20/2016  CLINICAL DATA:  Altered mental status EXAM: CHEST  2 VIEW COMPARISON:  05/31/2014 chest radiograph. FINDINGS: Stable cardiomediastinal silhouette with top-normal heart size. No pneumothorax. No pleural effusion. No pulmonary edema. Mild scarring versus atelectasis at the left lung base. No acute consolidative airspace disease. IMPRESSION: Mild scarring versus atelectasis at the left  lung base. Otherwise no active disease in the chest. Electronically Signed   By: Ilona Sorrel M.D.   On: 01/20/2016 16:30   Ct Head Wo Contrast  01/20/2016  CLINICAL DATA:  Altered mental status. Recent antibiotic course for urinary tract infection. Dementia. EXAM: CT HEAD WITHOUT CONTRAST TECHNIQUE: Contiguous axial images were obtained from the base of the skull through the vertex without intravenous contrast. COMPARISON:  02/02/2014 head CT . FINDINGS: No evidence of parenchymal hemorrhage or extra-axial fluid collection. No mass lesion, mass effect, or midline shift. No CT evidence of acute infarction. Intracranial atherosclerosis. Nonspecific stable mild subcortical and periventricular white matter hypodensity, most in keeping with chronic small vessel ischemic change. Generalized cerebral volume loss. Cerebral ventricle sizes are concordant with the degree of cerebral volume loss, with no ventriculomegaly. The visualized paranasal sinuses are essentially clear. The mastoid air cells are unopacified. No evidence of calvarial fracture. IMPRESSION: 1.  No evidence of acute intracranial abnormality. 2. Generalized cerebral volume loss and mild chronic small vessel ischemia. Electronically Signed   By: Ilona Sorrel M.D.   On: 01/20/2016 16:27    EKG:   Orders placed or performed during the hospital encounter of 01/20/16  . ED EKG  . ED EKG  . EKG 12-Lead  . EKG 12-Lead    IMPRESSION AND PLAN:   Savannah Young  is  a 82 y.o. female with a known history of Alzheimer's dementia, hypertension and allergies presents from assisted living facility secondary to worsening agitation.  #1 worsening agitation- could be underlying infection vs worsening dementia CT head without any acute findings - add risperidone prn - check UA- pending  #2 Alzheimer's dementia- confused, oriented to self Cont home meds  #3 elevated troponin-could be demand ischemia. No EKG changes. Patient not in any distress from  chest pain. Monitor on off unit telemetry. recycle troponins. Check CPK  #4 thrombocytosis- monitor with IV fluids Received aspirin in ER  #5 DVT prophylaxis- lovenox  Physical Therapy and social worker consults   All the records are reviewed and case discussed with ED provider. Management plans discussed with the patient, family and they are in agreement.  CODE STATUS: Full Code  TOTAL TIME TAKING CARE OF THIS PATIENT: 50 minutes.    Gladstone Lighter M.D on 01/20/2016 at 6:02 PM  Between 7am to 6pm - Pager - [phone]  After 6pm go to [email or web address] - password [email or web address] [facility]  [facility]  Office  [phone]  CC: Primary care physician; No primary care provider on file.

## 2016-01-20 NOTE — ED Notes (Signed)
Attempted to call report, informed bed not ready.

## 2016-01-20 NOTE — ED Notes (Addendum)
PT arrives to ER via [facility] from [facility]. Pt sent for evaluation of combative behavior. Pt just finished taking antibiotics for UTI. Staff report pt baseline of dementia but deny combative behavior. MD at bedside. Pt oriented to place, and person. Disoriented to time and place.  Pt in NAD. RR even and unlabored.

## 2016-01-21 LAB — CBC
HEMATOCRIT: 38 % (ref 35.0–47.0)
Hemoglobin: 12.4 g/dL (ref 12.0–16.0)
MCH: 26.9 pg (ref 26.0–34.0)
MCHC: 32.7 g/dL (ref 32.0–36.0)
MCV: 82.3 fL (ref 80.0–100.0)
Platelets: 749 10*3/uL — ABNORMAL HIGH (ref 150–440)
RBC: 4.62 MIL/uL (ref 3.80–5.20)
RDW: 15.6 % — AB (ref 11.5–14.5)
WBC: 6.7 10*3/uL (ref 3.6–11.0)

## 2016-01-21 LAB — BASIC METABOLIC PANEL
Anion gap: 9 (ref 5–15)
BUN: 9 mg/dL (ref 6–20)
CHLORIDE: 114 mmol/L — AB (ref 101–111)
CO2: 20 mmol/L — AB (ref 22–32)
CREATININE: 0.78 mg/dL (ref 0.44–1.00)
Calcium: 8.4 mg/dL — ABNORMAL LOW (ref 8.9–10.3)
GFR calc Af Amer: >60 mL/min (ref >60)
GFR calc non Af Amer: >60 mL/min (ref >60)
Glucose, Bld: 117 mg/dL — ABNORMAL HIGH (ref 65–99)
POTASSIUM: 3.4 mmol/L — AB (ref 3.5–5.1)
SODIUM: 143 mmol/L (ref 135–145)

## 2016-01-21 LAB — TROPONIN I
Troponin I: <0.03 ng/mL (ref <0.031)
Troponin I: <0.03 ng/mL (ref <0.031)

## 2016-01-21 MED ORDER — POTASSIUM CHLORIDE CRYS ER 20 MEQ PO TBCR
40.0000 meq | EXTENDED_RELEASE_TABLET | Freq: Once | ORAL | Status: AC
  Administered 2016-01-21: 40 meq via ORAL
  Filled 2016-01-21: qty 2

## 2016-01-21 MED ORDER — RISPERIDONE 0.5 MG PO TBDP: 0.5000 mg | ORAL_TABLET | Freq: Two times a day (BID) | ORAL | Status: DC | PRN

## 2016-01-21 NOTE — Progress Notes (Signed)
Patient removed IV. Attempted to restart IV twice. Unsuccessful. Patient agitated. MD notified. Will continue to monitor.

## 2016-01-21 NOTE — NC FL2 (Signed)
[place] LEVEL OF CARE SCREENING TOOL     IDENTIFICATION  Patient Name: Savannah Young Birthdate: Jun 20, 1932 Sex: female Admission Date (Current Location): 01/20/2016  [place] and [place] Number:  Engineering geologist and Address:  [facility], [address] [place], [place] [address]      Provider Number: [id number]  Attending Physician Name and Address:  Demetrios Loll, MD  Relative Name and Phone Number:       Current Level of Care: Hospital Recommended Level of Care: [facility]) Prior Approval Number:    Date Approved/Denied:   PASRR Number:  ([id number])  Discharge Plan: Domiciliary (Rest home) ([facility])    Current Diagnoses:   Primary Diagnosis: Dementia  Patient Active Problem List   Diagnosis Date Noted  . Agitation 01/20/2016   . Hypertension   . Diabetes mellitus ([place])   . COPD (chronic obstructive pulmonary disease) (HCC)          Orientation RESPIRATION BLADDER Height & Weight     Self  Normal Continent Weight: 113 lb 8.6 oz (51.5 kg) Height:  5\' 1"  (154.9 cm)  BEHAVIORAL SYMPTOMS/MOOD NEUROLOGICAL BOWEL NUTRITION STATUS   (None)  (None) Continent Diet (Regular)  AMBULATORY STATUS COMMUNICATION OF NEEDS Skin   Supervision Verbally Normal                       Personal Care Assistance Level of Assistance  Bathing, Feeding, Dressing Bathing Assistance: Limited assistance Feeding assistance: Independent Dressing Assistance: Limited assistance     Functional Limitations Info  Hearing, Sight, Speech Sight Info: Adequate Hearing Info: Adequate Speech Info: Adequate    SPECIAL CARE FACTORS FREQUENCY                       Contractures      Additional Factors Info  Code Status, Allergies Code Status Info:  (Full Code) Allergies Info:  (No Known Allergies)            Discharge Medications: Current Discharge Medication List     START taking these medications   Details  risperiDONE (RISPERDAL M-TABS) 0.5 MG disintegrating tablet Take 1 tablet (0.5 mg total) by mouth 2 (two) times daily as needed (agitation). Qty: 60 tablet, Refills: 0      CONTINUE these medications which have NOT CHANGED   Details  acetaminophen (TYLENOL) 500 MG tablet Take 500 mg by mouth 3 (three) times daily as needed for mild pain.    albuterol (PROVENTIL HFA;VENTOLIN HFA) 108 (90 Base) MCG/ACT inhaler Inhale 2 puffs into the lungs every 4 (four) hours as needed for wheezing or shortness of breath.    cholecalciferol (VITAMIN D) 1000 units tablet Take 2,000 Units by mouth daily.    Cranberry 425 MG CAPS Take 425 mg by mouth daily.    cyanocobalamin 500 MCG tablet Take 500 mcg by mouth daily.    diphenhydrAMINE (BENADRYL) 25 mg capsule Take 25 mg by mouth every 4 (four) hours as needed for itching.    donepezil (ARICEPT) 10 MG tablet Take 10 mg by mouth at bedtime.    fexofenadine (ALLEGRA) 180 MG tablet Take 180 mg by mouth daily.    guaifenesin (ROBITUSSIN) 100 MG/5ML syrup Take 200 mg by mouth 3 (three) times daily as needed for cough.    LORazepam (ATIVAN) 0.5 MG tablet Take 0.5 mg by mouth at bedtime. Pt is also able to take twice daily  as needed for anxiety.    memantine (NAMENDA XR) 28 MG CP24 24 hr capsule Take 28 mg by mouth at bedtime.    perphenazine (TRILAFON) 4 MG tablet Take 4 mg by mouth at bedtime.             Relevant Imaging Results:  Relevant Lab Results:   Additional Information  (SSN [id number])  Lorenso Quarry Sunkins, LCSW

## 2016-01-21 NOTE — Progress Notes (Signed)
Clinical Social Worker was informed that patient will be medically ready to discharge to [facility]. Patient's Legal Guardian Rica Mote is in a agreement with plan. CSW called Thayer Headings- Admissions Coordinator wit [facility] to confirm that patient's bed is ready. She reported that [facility] will transport patient at 1:30PM. All discharge information faxed to [place] [phone].   Call to patient's Legal Guardian- Rica Mote to inform her patient would discharge to [facility]. Patient will discharge to [facility] via facility transportation at 1:30 PM.  Ernest Pine, MSW, [place] Work Department [phone]

## 2016-01-21 NOTE — Progress Notes (Signed)
Patient alert to self.  VSS. No complaints of pain.  No PIV.  Telemetry removed.  Sitter at bedside until [facility] comes to pick up patient at 1330.

## 2016-01-21 NOTE — Discharge Instructions (Signed)
Heart healthy diet. °Fall and aspiration precaution. °

## 2016-01-21 NOTE — Clinical Social Work Note (Signed)
Clinical Social Work Assessment  Patient Details  Name: Savannah Young MRN: [id number] Date of Birth: 1932/05/15  Date of referral:  01/21/16               Reason for consult:  Discharge Planning                Permission sought to share information with:  Family Supports Permission granted to share information::  Yes, Verbal Permission Granted  Name::        Agency::     Relationship::   Savannah Young- Brother/ Legal Guardian )  Contact Information:     Housing/Transportation Living arrangements for the past 2 months:  [place] of Information:  Patient Patient Interpreter Needed:  None Criminal Activity/Legal Involvement Pertinent to Current Situation/Hospitalization:  No - Comment as needed Significant Relationships:  Other Family Members, Siblings Personnel officer- Programme researcher, broadcasting/film/video Guardian ) Lives with:  Facility Resident ([facility]) Do you feel safe going back to the place where you live?  Yes Need for family participation in patient care:  Yes (Comment) Savannah Young- Brother/ Legal Guardian )  Care giving concerns:  Patient is from [place]   Social Worker assessment / plan:  CSW was consulted due to patient being a resident of [facility]. Thayer Headings- Admissions Coordinator from [facility] approached CSW in hallway. Introduced herself and informed CSW that she has not been able to get in contact with patients brother/ legal guardian Savannah Young. Stated his phone was cut off and that she was worried. CSW informed Thayer Headings that she'll attempt to get in contact with patient's family and would contact her afterwards. CSW called Oscar's daughter- Mardene Celeste and inquired about his phone number. Mardene Celeste provided Oscar's cell phone number. CSW updated patient's facesheet with phone number. CSW contacted Savannah Young, introduced herself and her role and informed him that patient is at [facility] but is ready for discharge. He inquired if patient is well enough to discharge. CSW informed him  that MD Bridgett Larsson reports that patient is medically stable to discharge. He requested patient return to [facility] at discharge but requested that [facility] transport patient back because he is out of town. Granted CSW verbal permission to coordinate discharge plans with [facility]. CSW contacted Thayer Headings and informed her that she got in touch with Savannah Young and that he's agreed for patient to return to [facility] at discharge. Informed her that patient will need transportation. She stated she'll contact CSW with a transport time. Requested updated FL2 for patient and information to be faxed to [phone].   FL2 completed. Discharge information faxed to [facility]. CSW is awaiting Thayer Headings to provide CSW with a pick up time.   Employment status:  Retired Forensic scientist:  Medicare PT Recommendations:  Not assessed at this time Information / Referral to community resources:     Patient/Family's Response to care:  Patient's family was appreciative of CSW's assistance.   Patient/Family's Understanding of and Emotional Response to Diagnosis, Current Treatment, and Prognosis:  Reported they understood why patient was admitted into [facility] and why she's being discharged. Happy that [place] is assisting with discharge plans.   Emotional Assessment Appearance:  Appears stated age Attitude/Demeanor/Rapport:   (None) Affect (typically observed):  Calm, Pleasant Orientation:  Oriented to Self Alcohol / Substance use:  Not Applicable Psych involvement (Current and /or in the community):  No (Comment)  Discharge Needs  Concerns to be addressed:  Discharge Planning Concerns Readmission within the last 30 days:  No Current discharge risk:  None Barriers  to Discharge:  No Barriers Identified   [place], LCSW 01/21/2016, 11:29 AM

## 2016-01-21 NOTE — Discharge Summary (Signed)
[place] [facility] [place] NAME: Savannah Young    MR#:  [id number]  DATE OF BIRTH:  08-Aug-1932  DATE OF ADMISSION:  01/20/2016 ADMITTING PHYSICIAN: Gladstone Lighter, MD  DATE OF DISCHARGE: 01/21/2016 PRIMARY CARE PHYSICIAN: No primary care provider on file.    ADMISSION DIAGNOSIS:  Thrombocytosis (HCC) [D47.3] Elevated troponin I level [R79.89] Dementia, with behavioral disturbance [F03.91]   DISCHARGE DIAGNOSIS:    worsening agitation, possible due to worsening dementia  SECONDARY DIAGNOSIS:   Past Medical History  Diagnosis Date  . Dementia   . Hypertension   . Diabetes mellitus ([place])   . COPD (chronic obstructive pulmonary disease) [facility])     HOSPITAL COURSE:  Savannah Young is a 82 y.o. female with a known history of Alzheimer's dementia, hypertension and allergies presents from assisted living facility secondary to worsening agitation.  #1 worsening agitation, possible due to worsening dementia CT head without any acute findings - added risperidone prn  #2 Alzheimer's dementia- confused, oriented to self Cont home meds  #3 elevated troponin-could be demand ischemia. No EKG changes. Patient not in any distress from chest pain. Normal recycled troponins.  #4 thrombocytosis- chronic, f/u CBC as outpatient. Received aspirin in ER  Hypokalemia, given potassium.  DISCHARGE CONDITIONS:   Stable, discharge back to ALF today.  CONSULTS OBTAINED:     DRUG ALLERGIES:  No Known Allergies  DISCHARGE MEDICATIONS:   Current Discharge Medication List    START taking these medications   Details  risperiDONE (RISPERDAL M-TABS) 0.5 MG disintegrating tablet Take 1 tablet (0.5 mg total) by mouth 2 (two) times daily as needed (agitation). Qty: 60 tablet, Refills: 0      CONTINUE these medications which have NOT CHANGED   Details  acetaminophen (TYLENOL) 500 MG tablet Take 500 mg by mouth 3 (three) times daily as needed  for mild pain.    albuterol (PROVENTIL HFA;VENTOLIN HFA) 108 (90 Base) MCG/ACT inhaler Inhale 2 puffs into the lungs every 4 (four) hours as needed for wheezing or shortness of breath.    cholecalciferol (VITAMIN D) 1000 units tablet Take 2,000 Units by mouth daily.    Cranberry 425 MG CAPS Take 425 mg by mouth daily.    cyanocobalamin 500 MCG tablet Take 500 mcg by mouth daily.    diphenhydrAMINE (BENADRYL) 25 mg capsule Take 25 mg by mouth every 4 (four) hours as needed for itching.    donepezil (ARICEPT) 10 MG tablet Take 10 mg by mouth at bedtime.    fexofenadine (ALLEGRA) 180 MG tablet Take 180 mg by mouth daily.    guaifenesin (ROBITUSSIN) 100 MG/5ML syrup Take 200 mg by mouth 3 (three) times daily as needed for cough.    LORazepam (ATIVAN) 0.5 MG tablet Take 0.5 mg by mouth at bedtime. Pt is also able to take twice daily as needed for anxiety.    memantine (NAMENDA XR) 28 MG CP24 24 hr capsule Take 28 mg by mouth at bedtime.    perphenazine (TRILAFON) 4 MG tablet Take 4 mg by mouth at bedtime.         DISCHARGE INSTRUCTIONS:    If you experience worsening of your admission symptoms, develop shortness of breath, life threatening emergency, suicidal or homicidal thoughts you must seek medical attention immediately by calling 911 or calling your MD immediately  if symptoms less severe.  You Must read complete instructions/literature along with all the possible adverse reactions/side effects for all the Medicines you take and  that have been prescribed to you. Take any new Medicines after you have completely understood and accept all the possible adverse reactions/side effects.   Please note  You were cared for by a hospitalist during your hospital stay. If you have any questions about your discharge medications or the care you received while you were in the hospital after you are discharged, you can call the unit and asked to speak with the hospitalist on call if the  hospitalist that took care of you is not available. Once you are discharged, your primary care physician will handle any further medical issues. Please note that NO REFILLS for any discharge medications will be authorized once you are discharged, as it is imperative that you return to your primary care physician (or establish a relationship with a primary care physician if you do not have one) for your aftercare needs so that they can reassess your need for medications and monitor your lab values.    Today   SUBJECTIVE   No complaint, demented, no agitation.   VITAL SIGNS:  Blood pressure 153/64, pulse 71, temperature 98 F (36.7 C), temperature source Oral, resp. rate 18, height 5\' 1"  (1.549 m), weight 113 lb 8.6 oz (51.5 kg), last menstrual period 01/20/2016, SpO2 97 %.  I/O:   Intake/Output Summary (Last 24 hours) at 01/21/16 1112 Last data filed at 01/21/16 0900  Gross per 24 hour  Intake    240 ml  Output      0 ml  Net    240 ml    PHYSICAL EXAMINATION:  GENERAL:  82 y.o.-year-old patient lying in the bed with no acute distress.  EYES: Pupils equal, round, reactive to light and accommodation. No scleral icterus. Extraocular muscles intact.  HEENT: Head atraumatic, normocephalic. Oropharynx and nasopharynx clear.  NECK:  Supple, no jugular venous distention. No thyroid enlargement, no tenderness.  LUNGS: Normal breath sounds bilaterally, no wheezing, rales,rhonchi or crepitation. No use of accessory muscles of respiration.  CARDIOVASCULAR: S1, S2 normal. No murmurs, rubs, or gallops.  ABDOMEN: Soft, non-tender, non-distended. Bowel sounds present. No organomegaly or mass.  EXTREMITIES: No pedal edema, cyanosis, or clubbing.  NEUROLOGIC: Cranial nerves II through XII are intact. Muscle strength 4/5 in all extremities. Sensation intact. Gait not checked.  PSYCHIATRIC: The patient is demented, no agitation. SKIN: No obvious rash, lesion, or ulcer.   DATA REVIEW:    CBC  Recent Labs Lab 01/21/16 0144  WBC 6.7  HGB 12.4  HCT 38.0  PLT 749*    Chemistries   Recent Labs Lab 01/20/16 1630 01/21/16 0144  NA 141 143  K 3.8 3.4*  CL 107 114*  CO2 25 20*  GLUCOSE 101* 117*  BUN 11 9  CREATININE 0.88 0.78  CALCIUM 9.1 8.4*  AST 26  --   ALT 16  --   ALKPHOS 101  --   BILITOT 0.2*  --     Cardiac Enzymes  Recent Labs Lab 01/21/16 0831  TROPONINI <0.03    Microbiology Results  Results for orders placed or performed during the hospital encounter of 01/20/16  MRSA PCR Screening     Status: None   Collection Time: 01/20/16  8:27 PM  Result Value Ref Range Status   MRSA by PCR NEGATIVE NEGATIVE Final    Comment:        The GeneXpert MRSA Assay (FDA approved for NASAL specimens only), is one component of a comprehensive MRSA colonization surveillance program. It is not intended to diagnose  MRSA infection nor to guide or monitor treatment for MRSA infections.     RADIOLOGY:  Dg Chest 2 View  01/20/2016  CLINICAL DATA:  Altered mental status EXAM: CHEST  2 VIEW COMPARISON:  05/31/2014 chest radiograph. FINDINGS: Stable cardiomediastinal silhouette with top-normal heart size. No pneumothorax. No pleural effusion. No pulmonary edema. Mild scarring versus atelectasis at the left lung base. No acute consolidative airspace disease. IMPRESSION: Mild scarring versus atelectasis at the left lung base. Otherwise no active disease in the chest. Electronically Signed   By: Ilona Sorrel M.D.   On: 01/20/2016 16:30   Ct Head Wo Contrast  01/20/2016  CLINICAL DATA:  Altered mental status. Recent antibiotic course for urinary tract infection. Dementia. EXAM: CT HEAD WITHOUT CONTRAST TECHNIQUE: Contiguous axial images were obtained from the base of the skull through the vertex without intravenous contrast. COMPARISON:  02/02/2014 head CT . FINDINGS: No evidence of parenchymal hemorrhage or extra-axial fluid collection. No mass lesion, mass  effect, or midline shift. No CT evidence of acute infarction. Intracranial atherosclerosis. Nonspecific stable mild subcortical and periventricular white matter hypodensity, most in keeping with chronic small vessel ischemic change. Generalized cerebral volume loss. Cerebral ventricle sizes are concordant with the degree of cerebral volume loss, with no ventriculomegaly. The visualized paranasal sinuses are essentially clear. The mastoid air cells are unopacified. No evidence of calvarial fracture. IMPRESSION: 1.  No evidence of acute intracranial abnormality. 2. Generalized cerebral volume loss and mild chronic small vessel ischemia. Electronically Signed   By: Ilona Sorrel M.D.   On: 01/20/2016 16:27        Management plans discussed with the patient, family and they are in agreement.  CODE STATUS:     Code Status Orders        Start     Ordered   01/20/16 1948  Full code   Continuous     01/20/16 1947    Code Status History    Date Active Date Inactive Code Status Order ID Comments User Context   This patient has a current code status but no historical code status.      TOTAL TIME TAKING CARE OF THIS PATIENT: 27 minutes.    Demetrios Loll M.D on 01/21/2016 at 11:12 AM  Between 7am to 6pm - Pager - [phone]  After 6pm go to [email or web address] - password [email or web address] [facility]  [facility]  Office  [phone]  CC: Primary care physician; No primary care provider on file.

## 2016-02-22 ENCOUNTER — Emergency Department: Payer: Medicare Other

## 2016-02-22 ENCOUNTER — Encounter: Payer: Self-pay | Admitting: Emergency Medicine

## 2016-02-22 ENCOUNTER — Other Ambulatory Visit: Payer: Self-pay

## 2016-02-22 ENCOUNTER — Observation Stay
Admission: EM | Admit: 2016-02-22 | Discharge: 2016-02-24 | Disposition: A | Discharge status: Home / Self Care | Payer: Medicare Other | Attending: Internal Medicine | Admitting: Internal Medicine

## 2016-02-22 DIAGNOSIS — G459 Transient cerebral ischemic attack, unspecified: Secondary | ICD-10-CM | POA: Diagnosis present

## 2016-02-22 DIAGNOSIS — G319 Degenerative disease of nervous system, unspecified: Secondary | ICD-10-CM | POA: Insufficient documentation

## 2016-02-22 DIAGNOSIS — R29810 Facial weakness: Secondary | ICD-10-CM | POA: Diagnosis present

## 2016-02-22 DIAGNOSIS — R5383 Other fatigue: Secondary | ICD-10-CM

## 2016-02-22 DIAGNOSIS — I10 Essential (primary) hypertension: Secondary | ICD-10-CM | POA: Diagnosis not present

## 2016-02-22 DIAGNOSIS — Z79899 Other long term (current) drug therapy: Secondary | ICD-10-CM | POA: Insufficient documentation

## 2016-02-22 DIAGNOSIS — Q676 Pectus excavatum: Secondary | ICD-10-CM | POA: Diagnosis not present

## 2016-02-22 DIAGNOSIS — R410 Disorientation, unspecified: Secondary | ICD-10-CM | POA: Diagnosis present

## 2016-02-22 DIAGNOSIS — E119 Type 2 diabetes mellitus without complications: Secondary | ICD-10-CM | POA: Diagnosis not present

## 2016-02-22 DIAGNOSIS — I700 Atherosclerosis of aorta: Secondary | ICD-10-CM | POA: Insufficient documentation

## 2016-02-22 DIAGNOSIS — D473 Essential (hemorrhagic) thrombocythemia: Secondary | ICD-10-CM | POA: Diagnosis present

## 2016-02-22 DIAGNOSIS — J449 Chronic obstructive pulmonary disease, unspecified: Secondary | ICD-10-CM | POA: Insufficient documentation

## 2016-02-22 DIAGNOSIS — D7589 Other specified diseases of blood and blood-forming organs: Secondary | ICD-10-CM | POA: Diagnosis not present

## 2016-02-22 DIAGNOSIS — F0390 Unspecified dementia without behavioral disturbance: Secondary | ICD-10-CM | POA: Diagnosis not present

## 2016-02-22 DIAGNOSIS — L8990 Pressure ulcer of unspecified site, unspecified stage: Secondary | ICD-10-CM | POA: Insufficient documentation

## 2016-02-22 DIAGNOSIS — R4182 Altered mental status, unspecified: Secondary | ICD-10-CM

## 2016-02-22 DIAGNOSIS — D75839 Thrombocytosis, unspecified: Secondary | ICD-10-CM

## 2016-02-22 LAB — CBC WITH DIFFERENTIAL/PLATELET
BASOS PCT: 1 %
Basophils Absolute: 0.1 10*3/uL (ref 0–0.1)
EOS ABS: 0 10*3/uL (ref 0–0.7)
EOS PCT: 0 %
HCT: 38.5 % (ref 35.0–47.0)
HEMOGLOBIN: 12.8 g/dL (ref 12.0–16.0)
Lymphocytes Relative: 14 %
Lymphs Abs: 1.3 10*3/uL (ref 1.0–3.6)
MCH: 26.9 pg (ref 26.0–34.0)
MCHC: 33.2 g/dL (ref 32.0–36.0)
MCV: 81.1 fL (ref 80.0–100.0)
MONO ABS: 0.8 10*3/uL (ref 0.2–0.9)
MONOS PCT: 9 %
NEUTROS PCT: 76 %
Neutro Abs: 6.9 10*3/uL — ABNORMAL HIGH (ref 1.4–6.5)
Platelets: 838 10*3/uL — ABNORMAL HIGH (ref 150–440)
RBC: 4.75 MIL/uL (ref 3.80–5.20)
RDW: 15.6 % — AB (ref 11.5–14.5)
WBC: 9.1 10*3/uL (ref 3.6–11.0)

## 2016-02-22 LAB — URINALYSIS COMPLETE WITH MICROSCOPIC (ARMC ONLY)
BILIRUBIN URINE: NEGATIVE
Bacteria, UA: NONE SEEN
GLUCOSE, UA: NEGATIVE mg/dL
HGB URINE DIPSTICK: NEGATIVE
LEUKOCYTES UA: NEGATIVE
NITRITE: NEGATIVE
PH: 5 (ref 5.0–8.0)
Protein, ur: 30 mg/dL — AB
RBC / HPF: NONE SEEN RBC/hpf (ref 0–5)
SPECIFIC GRAVITY, URINE: 1.024 (ref 1.005–1.030)
Squamous Epithelial / LPF: NONE SEEN
WBC UA: NONE SEEN WBC/hpf (ref 0–5)

## 2016-02-22 LAB — COMPREHENSIVE METABOLIC PANEL
ALK PHOS: 87 U/L (ref 38–126)
ALT: 18 U/L (ref 14–54)
ANION GAP: 10 (ref 5–15)
AST: 48 U/L — ABNORMAL HIGH (ref 15–41)
Albumin: 3.7 g/dL (ref 3.5–5.0)
BUN: 16 mg/dL (ref 6–20)
CALCIUM: 9.3 mg/dL (ref 8.9–10.3)
CO2: 25 mmol/L (ref 22–32)
Chloride: 111 mmol/L (ref 101–111)
Creatinine, Ser: 1.03 mg/dL — ABNORMAL HIGH (ref 0.44–1.00)
GFR calc non Af Amer: 49 mL/min — ABNORMAL LOW (ref >60)
GFR, EST AFRICAN AMERICAN: 57 mL/min — AB (ref >60)
Glucose, Bld: 90 mg/dL (ref 65–99)
POTASSIUM: 3.8 mmol/L (ref 3.5–5.1)
SODIUM: 146 mmol/L — AB (ref 135–145)
TOTAL PROTEIN: 6.7 g/dL (ref 6.5–8.1)
Total Bilirubin: 0.7 mg/dL (ref 0.3–1.2)

## 2016-02-22 LAB — TROPONIN I: Troponin I: 0.03 ng/mL (ref <0.031)

## 2016-02-22 MED ORDER — POTASSIUM CHLORIDE IN NACL 20-0.45 MEQ/L-% IV SOLN
INTRAVENOUS | Status: DC
  Administered 2016-02-22 – 2016-02-24 (×5): via INTRAVENOUS
  Filled 2016-02-22 (×9): qty 1000

## 2016-02-22 MED ORDER — LORAZEPAM 0.5 MG PO TABS
0.5000 mg | ORAL_TABLET | Freq: Every day | ORAL | Status: DC
  Administered 2016-02-22: 0.5 mg via ORAL
  Filled 2016-02-22: qty 1

## 2016-02-22 MED ORDER — GUAIFENESIN 100 MG/5ML PO SYRP
200.0000 mg | ORAL_SOLUTION | Freq: Three times a day (TID) | ORAL | Status: DC | PRN
  Filled 2016-02-22: qty 10

## 2016-02-22 MED ORDER — SODIUM CHLORIDE 0.9 % IV BOLUS (SEPSIS)
500.0000 mL | Freq: Once | INTRAVENOUS | Status: AC
  Administered 2016-02-22: 500 mL via INTRAVENOUS

## 2016-02-22 MED ORDER — POLYETHYLENE GLYCOL 3350 17 G PO PACK
17.0000 g | PACK | Freq: Every day | ORAL | Status: DC | PRN
  Administered 2016-02-23 – 2016-02-24 (×2): 17 g via ORAL
  Filled 2016-02-22 (×2): qty 1

## 2016-02-22 MED ORDER — DONEPEZIL HCL 5 MG PO TABS
10.0000 mg | ORAL_TABLET | Freq: Every day | ORAL | Status: DC
  Administered 2016-02-22 – 2016-02-23 (×2): 10 mg via ORAL
  Filled 2016-02-22 (×2): qty 2

## 2016-02-22 MED ORDER — ALBUTEROL SULFATE (2.5 MG/3ML) 0.083% IN NEBU: 2.5000 mg | INHALATION_SOLUTION | RESPIRATORY_TRACT | Status: DC | PRN

## 2016-02-22 MED ORDER — ENOXAPARIN SODIUM 40 MG/0.4ML ~~LOC~~ SOLN
SUBCUTANEOUS | Status: AC
  Administered 2016-02-22: 40 mg via SUBCUTANEOUS
  Filled 2016-02-22: qty 0.4

## 2016-02-22 MED ORDER — PERPHENAZINE 8 MG PO TABS
8.0000 mg | ORAL_TABLET | Freq: Every day | ORAL | Status: DC
  Administered 2016-02-22: 8 mg via ORAL
  Filled 2016-02-22: qty 1

## 2016-02-22 MED ORDER — DOCUSATE SODIUM 100 MG PO CAPS
100.0000 mg | ORAL_CAPSULE | Freq: Two times a day (BID) | ORAL | Status: DC
  Administered 2016-02-22 – 2016-02-24 (×4): 100 mg via ORAL
  Filled 2016-02-22 (×4): qty 1

## 2016-02-22 MED ORDER — MEMANTINE HCL ER 14 MG PO CP24
28.0000 mg | ORAL_CAPSULE | Freq: Every day | ORAL | Status: DC
  Administered 2016-02-22 – 2016-02-23 (×2): 28 mg via ORAL
  Filled 2016-02-22 (×2): qty 1
  Filled 2016-02-22: qty 2

## 2016-02-22 MED ORDER — ENOXAPARIN SODIUM 40 MG/0.4ML ~~LOC~~ SOLN
40.0000 mg | SUBCUTANEOUS | Status: DC
  Administered 2016-02-22 – 2016-02-23 (×2): 40 mg via SUBCUTANEOUS
  Filled 2016-02-22: qty 0.4

## 2016-02-22 MED ORDER — ONDANSETRON HCL 4 MG PO TABS: 4.0000 mg | ORAL_TABLET | Freq: Four times a day (QID) | ORAL | Status: DC | PRN

## 2016-02-22 MED ORDER — ASPIRIN EC 81 MG PO TBEC
DELAYED_RELEASE_TABLET | ORAL | Status: AC
  Administered 2016-02-22: 81 mg via ORAL
  Filled 2016-02-22: qty 1

## 2016-02-22 MED ORDER — ONDANSETRON HCL 4 MG/2ML IJ SOLN: 4.0000 mg | Freq: Four times a day (QID) | INTRAMUSCULAR | Status: DC | PRN

## 2016-02-22 MED ORDER — ACETAMINOPHEN 325 MG PO TABS: 650.0000 mg | ORAL_TABLET | Freq: Four times a day (QID) | ORAL | Status: DC | PRN

## 2016-02-22 MED ORDER — SODIUM CHLORIDE 0.9% FLUSH
3.0000 mL | Freq: Two times a day (BID) | INTRAVENOUS | Status: DC
Start: 2016-02-22 — End: 2016-02-24
  Administered 2016-02-22 – 2016-02-23 (×2): 3 mL via INTRAVENOUS

## 2016-02-22 MED ORDER — SODIUM CHLORIDE 0.9 % IV BOLUS (SEPSIS)
1000.0000 mL | Freq: Once | INTRAVENOUS | Status: AC
  Administered 2016-02-22: 1000 mL via INTRAVENOUS

## 2016-02-22 MED ORDER — ASPIRIN EC 81 MG PO TBEC
81.0000 mg | DELAYED_RELEASE_TABLET | Freq: Every day | ORAL | Status: DC
  Administered 2016-02-22 – 2016-02-23 (×2): 81 mg via ORAL
  Filled 2016-02-22: qty 1

## 2016-02-22 MED ORDER — ACETAMINOPHEN 650 MG RE SUPP: 650.0000 mg | Freq: Four times a day (QID) | RECTAL | Status: DC | PRN

## 2016-02-22 MED ORDER — RISPERIDONE 0.5 MG PO TBDP: 0.5000 mg | ORAL_TABLET | Freq: Two times a day (BID) | ORAL | Status: DC | PRN

## 2016-02-22 NOTE — ED Notes (Signed)
Bed alarm applied and on.

## 2016-02-22 NOTE — H&P (Signed)
[facility] NAME: Savannah Young    MR#:  [id number]  DATE OF BIRTH:  [birth year]  DATE OF ADMISSION:  02/22/2016  PRIMARY CARE PHYSICIAN: No primary care provider on file.   REQUESTING/REFERRING PHYSICIAN: Dr. Edd Fabian  CHIEF COMPLAINT:   Chief Complaint  Patient presents with  . Altered Mental Status    HISTORY OF PRESENT ILLNESS:  Savannah Young  is a 82 y.o. female with a known history of Hypertension, COPD, dementia presents to the emergency room from assisted living due to being more lethargic and confused. Patient at baseline is pleasantly confused but can carry a conversation and is able to ambulate. Today she is extremely weak and more confused. Here in the emergency room due to concern for right facial droop patient is being admitted for TIA/CVA. History obtained from ER staff and old records. Patient due to dementia is unable to contribute to history. She was admitted recently to the hospital for confusion and elevated troponin and discharged with stable troponins. Risperidone was added due to her agitation.  PAST MEDICAL HISTORY:   Past Medical History  Diagnosis Date  . Dementia   . Hypertension   . Diabetes mellitus ([place])   . COPD (chronic obstructive pulmonary disease) ([place])     PAST SURGICAL HISTORY:  History reviewed. No pertinent past surgical history.  SOCIAL HISTORY:   Social History  Substance Use Topics  . Smoking status: Never Smoker   . Smokeless tobacco: Not on file  . Alcohol Use: No    FAMILY HISTORY:  No family history on file.  DRUG ALLERGIES:  No Known Allergies  REVIEW OF SYSTEMS:   Review of Systems  Unable to perform ROS: dementia    MEDICATIONS AT HOME:   Prior to Admission medications   Medication Sig Start Date End Date Taking? Authorizing Provider  cholecalciferol (VITAMIN D) 1000 units tablet Take 2,000 Units by mouth daily.   Yes Historical Provider, MD   Cranberry 425 MG CAPS Take 425 mg by mouth daily.   Yes Historical Provider, MD  cyanocobalamin 500 MCG tablet Take 500 mcg by mouth daily.   Yes Historical Provider, MD  fexofenadine (ALLEGRA) 180 MG tablet Take 180 mg by mouth daily.   Yes Historical Provider, MD  acetaminophen (TYLENOL) 500 MG tablet Take 500 mg by mouth 3 (three) times daily as needed for mild pain.    Historical Provider, MD  albuterol (PROVENTIL HFA;VENTOLIN HFA) 108 (90 Base) MCG/ACT inhaler Inhale 2 puffs into the lungs every 4 (four) hours as needed for wheezing or shortness of breath.    Historical Provider, MD  diphenhydrAMINE (BENADRYL) 25 mg capsule Take 25 mg by mouth every 4 (four) hours as needed for itching.    Historical Provider, MD  donepezil (ARICEPT) 10 MG tablet Take 10 mg by mouth at bedtime.    Historical Provider, MD  guaifenesin (ROBITUSSIN) 100 MG/5ML syrup Take 200 mg by mouth 3 (three) times daily as needed for cough.    Historical Provider, MD  LORazepam (ATIVAN) 0.5 MG tablet Take 0.5 mg by mouth at bedtime. Pt is also able to take twice daily as needed for anxiety.    Historical Provider, MD  memantine (NAMENDA XR) 28 MG CP24 24 hr capsule Take 28 mg by mouth at bedtime.    Historical Provider, MD     VITAL SIGNS:  Blood pressure 132/59, pulse 76, temperature 97.8 F (36.6 C), temperature source Oral, resp. rate  27, height 5\' 3"  (1.6 m), weight 52.3 kg (115 lb 4.8 oz), last menstrual period 01/20/2016, SpO2 98 %.  PHYSICAL EXAMINATION:  Physical Exam  GENERAL:  82 y.o.-year-old patient lying in the bed with no acute distress.  EYES: Pupils equal, round, reactive to light and accommodation. No scleral icterus. Extraocular muscles intact.  HEENT: Head atraumatic, normocephalic. Oropharynx and nasopharynx clear. No oropharyngeal erythema, moist oral mucosa  NECK:  Supple, no jugular venous distention. No thyroid enlargement, no tenderness.  LUNGS: Normal breath sounds bilaterally, no wheezing,  rales, rhonchi. No use of accessory muscles of respiration.  CARDIOVASCULAR: S1, S2 normal. No murmurs, rubs, or gallops.  ABDOMEN: Soft, nontender, nondistended. Bowel sounds present. No organomegaly or mass.  EXTREMITIES: No pedal edema, cyanosis, or clubbing. + 2 pedal & radial pulses b/l.   NEUROLOGIC: Cranial nerves II through XII are intact. No focal Motor or sensory deficits appreciated b/l. Right facial droop PSYCHIATRIC: The patient is alert and awake. Presently confused SKIN: No obvious rash, lesion, or ulcer.   LABORATORY PANEL:   CBC  Recent Labs Lab 02/22/16 1703  WBC 9.1  HGB 12.8  HCT 38.5  PLT 838*   ------------------------------------------------------------------------------------------------------------------  Chemistries   Recent Labs Lab 02/22/16 1703  NA 146*  K 3.8  CL 111  CO2 25  GLUCOSE 90  BUN 16  CREATININE 1.03*  CALCIUM 9.3  AST 48*  ALT 18  ALKPHOS 87  BILITOT 0.7   ------------------------------------------------------------------------------------------------------------------  Cardiac Enzymes  Recent Labs Lab 02/22/16 1703  TROPONINI 0.03   ------------------------------------------------------------------------------------------------------------------  RADIOLOGY:  Dg Chest 2 View  02/22/2016  CLINICAL DATA:  Dementia.  Altered mental status. EXAM: CHEST  2 VIEW COMPARISON:  01/20/2016 FINDINGS: Atherosclerotic calcification of the aortic arch. Aortic tortuosity. Biapical pleural parenchymal scarring. Mild pectus excavatum. Chronic interstitial accentuation.  No pleural effusion. IMPRESSION: 1. Chronic interstitial accentuation in both lungs, unchanged. 2. Atherosclerotic aortic arch. Electronically Signed   By: Van Clines M.D.   On: 02/22/2016 17:49   Ct Head Wo Contrast  02/22/2016  CLINICAL DATA:  Altered mental status.  History dementia. EXAM: CT HEAD WITHOUT CONTRAST TECHNIQUE: Contiguous axial images were  obtained from the base of the skull through the vertex without intravenous contrast. COMPARISON:  Head CT dated 01/20/2016. FINDINGS: Brain: Again noted is mild generalized age related brain atrophy with commensurate dilatation of the ventricles and sulci. There is no mass, hemorrhage, edema or other evidence of acute parenchymal abnormality. No extra-axial hemorrhage. Vascular: No hyperdense vessel or unexpected calcification. There are chronic calcified atherosclerotic changes of the large vessels at the skull base. Skull: Negative for fracture or focal lesion. Sinuses/Orbits: No acute findings. Visualized upper paranasal sinuses are clear. Mastoid air cells are clear. Other: None. IMPRESSION: No acute findings.  No intracranial mass, hemorrhage or edema. Electronically Signed   By: Franki Cabot M.D.   On: 02/22/2016 18:39     IMPRESSION AND PLAN:   * Confusion This is likely due to worsening dementia. We'll check MRI of the brain due to concern regarding the right facial droop. No other focal neurological deficit found on exam. Check MRI of the brain, echo, carotid Doppler. Aspirin. Consult physical therapy. Consult social work.  * Hypertension Continue home medication  * Thrombocytosis Oncology consulted from emergency room. Dr. Grayland Ormond will see patient tomorrow.  * DVT prophylaxis with Lovenox  All the records are reviewed and case discussed with ED provider. Management plans discussed with the patient, family and they are in  agreement.  CODE STATUS: FULL CODE  TOTAL TIME TAKING CARE OF THIS PATIENT: 40 minutes.   Hillary Bow R M.D on 02/22/2016 at 8:12 PM  Between 7am to 6pm - Pager - [phone]  After 6pm go to [email or web address] - password [email or web address] [facility]  [facility]  Office  [phone]  CC: Primary care physician; No primary care provider on file.  Note: This dictation was prepared with Dragon dictation along with smaller phrase technology. Any  transcriptional errors that result from this process are unintentional.

## 2016-02-22 NOTE — ED Notes (Signed)
Patient transported to CT 

## 2016-02-22 NOTE — ED Notes (Signed)
Patient transported to radiology

## 2016-02-22 NOTE — ED Notes (Signed)
Per [facility], patient comes from [facility] due to altered mental status. Hx of dementia. Staff is unable to state when she was last known well, "possibly yesterday". Patient denies any pain at this time. Patient only able to state her name and birth day and month. Disoriented x3.

## 2016-02-22 NOTE — ED Provider Notes (Signed)
[facility] Emergency Department Provider Note   ____________________________________________  Time seen: Approximately 5:00 PM  I have reviewed the triage vital signs and the nursing notes.   HISTORY  Chief Complaint Altered Mental Status  Caveat-history of present illness and review of systems limited due to patient's altered mental status. All information obtained from staff at [facility].  HPI Antoria A Barrineau is a 82 y.o. female with history of dementia, hypertension, diabetes, COPD who presents from her living facility for altered mental status today and perceived left facial droop. According to staff, the patient was in her normal state of health around 11:30 AM however when she her nap at 4 PM, she appeared lethargic, was not interacting as she normally would, could not answer questions appropriately. Last night she was mildly agitated with staff however she had been in her usual state of health without illness, no vomiting, diarrhea, fevers or chills.   Past Medical History  Diagnosis Date  . Dementia   . Hypertension   . Diabetes mellitus ([place])   . COPD (chronic obstructive pulmonary disease) [facility])     Patient Active Problem List   Diagnosis Date Noted  . Agitation 01/20/2016    History reviewed. No pertinent past surgical history.  Current Outpatient Rx  Name  Route  Sig  Dispense  Refill  . acetaminophen (TYLENOL) 500 MG tablet   Oral   Take 500 mg by mouth 3 (three) times daily as needed for mild pain.         Marland Kitchen albuterol (PROVENTIL HFA;VENTOLIN HFA) 108 (90 Base) MCG/ACT inhaler   Inhalation   Inhale 2 puffs into the lungs every 4 (four) hours as needed for wheezing or shortness of breath.         . cholecalciferol (VITAMIN D) 1000 units tablet   Oral   Take 2,000 Units by mouth daily.         . Cranberry 425 MG CAPS   Oral   Take 425 mg by mouth daily.         . cyanocobalamin 500 MCG tablet  Oral   Take 500 mcg by mouth daily.         . diphenhydrAMINE (BENADRYL) 25 mg capsule   Oral   Take 25 mg by mouth every 4 (four) hours as needed for itching.         . donepezil (ARICEPT) 10 MG tablet   Oral   Take 10 mg by mouth at bedtime.         . fexofenadine (ALLEGRA) 180 MG tablet   Oral   Take 180 mg by mouth daily.         Marland Kitchen guaifenesin (ROBITUSSIN) 100 MG/5ML syrup   Oral   Take 200 mg by mouth 3 (three) times daily as needed for cough.         Marland Kitchen LORazepam (ATIVAN) 0.5 MG tablet   Oral   Take 0.5 mg by mouth at bedtime. Pt is also able to take twice daily as needed for anxiety.         . memantine (NAMENDA XR) 28 MG CP24 24 hr capsule   Oral   Take 28 mg by mouth at bedtime.         Marland Kitchen perphenazine (TRILAFON) 4 MG tablet   Oral   Take 4 mg by mouth at bedtime.         . risperiDONE (RISPERDAL M-TABS) 0.5 MG disintegrating tablet  Oral   Take 1 tablet (0.5 mg total) by mouth 2 (two) times daily as needed (agitation).   60 tablet   0     Allergies Review of patient's allergies indicates no known allergies.  No family history on file.  Social History Social History  Substance Use Topics  . Smoking status: Never Smoker   . Smokeless tobacco: None  . Alcohol Use: No    Review of Systems  Deloris A Schreib is a 82 y.o. female with history of dementia, hypertension, diabetes, COPD who presents from her living facility for altered mental status today and perceived left facial droop. ____________________________________________   PHYSICAL EXAM:  VITAL SIGNS: ED Triage Vitals  Enc Vitals Group     BP 02/22/16 1657 148/75 mmHg     Pulse Rate 02/22/16 1657 80     Resp 02/22/16 1657 16     Temp 02/22/16 1657 97.8 F (36.6 C)     Temp Source 02/22/16 1657 Oral     SpO2 02/22/16 1657 96 %     Weight 02/22/16 1657 115 lb 4.8 oz (52.3 kg)     Height 02/22/16 1657 5\' 3"  (1.6 m)     Head Cir --      Peak Flow --      Pain Score --       Pain Loc --      Pain Edu? --      Excl. in [place]? --     Constitutional: Alert And completely disoriented, unable to answer most questions, she does follow commands to move all extremities which she appears to do equally. She is in no acute distress. Eyes: Conjunctivae are normal. PERRL. EOMI. Head: Atraumatic. Nose: No congestion/rhinnorhea. Mouth/Throat: Mucous membranes are dry.  Oropharynx non-erythematous. Neck: No stridor.  Supple without meningismus. Cardiovascular: Normal rate, regular rhythm. Grossly normal heart sounds.  Good peripheral circulation. Respiratory: Normal respiratory effort.  No retractions. Lungs CTAB. Gastrointestinal: Soft and nontender. No distention. No CVA tenderness. Genitourinary: Deferred Musculoskeletal: No lower extremity tenderness nor edema.  No joint effusions. Neurologic:  Normal speech without dysarthria though she is not able to say very many words. She has equal strength and sensation in all extremities. No obvious facial droop on my exam. Skin:  Skin is warm, dry and intact. No rash noted. Psychiatric: Mood and affect are normal. Speech and behavior are normal.  ____________________________________________   LABS (all labs ordered are listed, but only abnormal results are displayed)  Labs Reviewed  CBC WITH DIFFERENTIAL/PLATELET - Abnormal; Notable for the following:    RDW 15.6 (*)    Platelets 838 (*)    Neutro Abs 6.9 (*)    All other components within normal limits  COMPREHENSIVE METABOLIC PANEL - Abnormal; Notable for the following:    Sodium 146 (*)    Creatinine, Ser 1.03 (*)    AST 48 (*)    GFR calc non Af Amer 49 (*)    GFR calc Af Amer 57 (*)    All other components within normal limits  URINALYSIS COMPLETEWITH MICROSCOPIC ([facility] ONLY) - Abnormal; Notable for the following:    Color, Urine AMBER (*)    APPearance HAZY (*)    Ketones, ur 1+ (*)    Protein, ur 30 (*)    All other components within normal limits  TROPONIN I    ____________________________________________  EKG  ED ECG REPORT I, Joanne Gavel, the attending physician, personally viewed and interpreted this ECG.   Date:  02/22/2016  EKG Time: 16:58  Rate: 81  Rhythm: normal sinus rhythm with sinus arrhythmia  Axis: normal  Intervals:none  ST&T Change: No acute ST elevation.  ____________________________________________  RADIOLOGY  CT head IMPRESSION: No acute findings. No intracranial mass, hemorrhage or edema.  CXR IMPRESSION: 1. Chronic interstitial accentuation in both lungs, unchanged. 2. Atherosclerotic aortic arch. ____________________________________________   PROCEDURES  Procedure(s) performed: None  Critical Care performed: No  ____________________________________________   INITIAL IMPRESSION / ASSESSMENT AND PLAN / ED COURSE  Pertinent labs & imaging results that were available during my care of the patient were reviewed by me and considered in my medical decision making (see chart for detai reassess for disposition.ls).  Imelda A Stelma is a 82 y.o. female with history of dementia, hypertension, diabetes, COPD who presents from her living facility for altered mental status today and perceived left facial droop. On exam, she is nontoxic appearing and in no acute distress. Her vital signs are stable and she is afebrile. She is very confused and according to staff at her living facility, she typically is awake, alert, oriented to self and often to place, she is generally quite interactive which she is not today. She appears to have a nonfocal neurological examination. We'll obtain screening labs, CT head, chest x-ray, urinalysis and reassess for disposition.  ----------------------------------------- 8:00 PM on 02/22/2016 ----------------------------------------- The patient continues to rest comfortably. Her workup is remarkable for very mild hypernatremia mild creatinine elevation. She also has significant  thrombocytosis, she has had thrombocytosis in the past, I discussed the case with Dr. Grayland Ormond who suspects it is likely reactive. I do not appreciate a facial droop though I suspect that given from psychosis and report of facial droop today, this could represent a CVA not detected on CT head. I've ordered IV fluids. As she is not back to her baseline in mental status, I discussed the case with the hospitalist for admission at this time.  ____________________________________________   FINAL CLINICAL IMPRESSION(S) / ED DIAGNOSES  Final diagnoses:  Altered mental status, unspecified altered mental status type  Thrombocytosis ([place])      NEW MEDICATIONS STARTED DURING THIS VISIT:  New Prescriptions   No medications on file     Note:  This document was prepared using Dragon voice recognition software and may include unintentional dictation errors.    Joanne Gavel, MD 02/22/16 2002

## 2016-02-22 NOTE — ED Notes (Addendum)
In and out cath completed by this RN at [facility]. Patient tolerated well. Soiled brief changed.

## 2016-02-23 ENCOUNTER — Observation Stay: Payer: Medicare Other

## 2016-02-23 ENCOUNTER — Observation Stay (HOSPITAL_BASED_OUTPATIENT_CLINIC_OR_DEPARTMENT_OTHER)
Admit: 2016-02-23 | Discharge: 2016-02-23 | Disposition: A | Discharge status: Home / Self Care | Payer: Medicare Other | Attending: Internal Medicine | Admitting: Internal Medicine

## 2016-02-23 DIAGNOSIS — L8990 Pressure ulcer of unspecified site, unspecified stage: Secondary | ICD-10-CM | POA: Insufficient documentation

## 2016-02-23 DIAGNOSIS — G459 Transient cerebral ischemic attack, unspecified: Secondary | ICD-10-CM

## 2016-02-23 LAB — BASIC METABOLIC PANEL
ANION GAP: 6 (ref 5–15)
BUN: 14 mg/dL (ref 6–20)
CHLORIDE: 114 mmol/L — AB (ref 101–111)
CO2: 24 mmol/L (ref 22–32)
Calcium: 8.1 mg/dL — ABNORMAL LOW (ref 8.9–10.3)
Creatinine, Ser: 0.8 mg/dL (ref 0.44–1.00)
Glucose, Bld: 77 mg/dL (ref 65–99)
POTASSIUM: 3.6 mmol/L (ref 3.5–5.1)
SODIUM: 144 mmol/L (ref 135–145)

## 2016-02-23 LAB — CBC
HEMATOCRIT: 32.9 % — AB (ref 35.0–47.0)
Hemoglobin: 11 g/dL — ABNORMAL LOW (ref 12.0–16.0)
MCH: 27.3 pg (ref 26.0–34.0)
MCHC: 33.5 g/dL (ref 32.0–36.0)
MCV: 81.5 fL (ref 80.0–100.0)
Platelets: 695 10*3/uL — ABNORMAL HIGH (ref 150–440)
RBC: 4.03 MIL/uL (ref 3.80–5.20)
RDW: 15.6 % — AB (ref 11.5–14.5)
WBC: 6.6 10*3/uL (ref 3.6–11.0)

## 2016-02-23 LAB — ECHOCARDIOGRAM COMPLETE
AOPV: 0.64 m/s
AV Area VTI: 2.01 cm2
AV peak Index: 1.35
AVPG: 9 mmHg
AVPKVEL: 147 cm/s
CHL CUP MV DEC (S): 216
E decel time: 216 msec
E/e' ratio: 11.03
FS: 47 % — AB (ref 28–44)
Height: 63 in
IVS/LV PW RATIO, ED: 0.73
LA ID, A-P, ES: 27 mm
LA vol A4C: 40 ml
LA vol index: 28.9 mL/m2
LA vol: 43.1 mL
LADIAMINDEX: 1.81 cm/m2
LDCA: 3.14 cm2
LEFT ATRIUM END SYS DIAM: 27 mm
LV PW d: 12.9 mm — AB (ref 0.6–1.1)
LV TDI E'LATERAL: 6.42
LV TDI E'MEDIAL: 5.66
LVEEAVG: 11.03
LVEEMED: 11.03
LVELAT: 6.42 cm/s
LVOT diameter: 20 mm
LVOTPV: 94.1 cm/s
MV pk A vel: 98.5 m/s
MVPG: 2 mmHg
MVPKEVEL: 70.8 m/s
RV TAPSE: 13.8 mm
Weight: 1729.6 oz

## 2016-02-23 LAB — MRSA PCR SCREENING: MRSA by PCR: NEGATIVE

## 2016-02-23 MED ORDER — ASPIRIN EC 325 MG PO TBEC
325.0000 mg | DELAYED_RELEASE_TABLET | Freq: Every day | ORAL | Status: DC
  Administered 2016-02-24: 325 mg via ORAL
  Filled 2016-02-23: qty 1

## 2016-02-23 NOTE — Plan of Care (Signed)
Problem: Skin Integrity: Goal: Risk for impaired skin integrity will decrease Outcome: Progressing Foam at bottom  Problem: Activity: Goal: Risk for activity intolerance will decrease Outcome: Progressing Chair and bedside commode

## 2016-02-23 NOTE — Evaluation (Signed)
Physical Therapy Evaluation Patient Details Name: Savannah Young MRN: [id number] DOB: 1932-07-03 Today's Date: 02/23/2016   History of Present Illness  Pt admitted for AMS with complaints of lethargy. Pt with history of HTN, dementia, and COPD. Pt currently lives at [place] [facility].  Clinical Impression  Pt is a pleasant 82 year old female who was admitted for AMS. Pt performs bed mobility with cga, transfers with min assist, and ambulation with cga and rw. Pt very pleasant and agreeable to therapy. Pt demonstrates deficits with strength/mobility/endurance. Pt left in recliner eating breakfast.  Would benefit from skilled PT to address above deficits and promote optimal return to PLOF. Pt is safe to return back to ALF as she is near her baseline level.      Follow Up Recommendations Home health PT (return back to ALF)    Equipment Recommendations       Recommendations for Other Services       Precautions / Restrictions Precautions Precautions: Fall Restrictions Weight Bearing Restrictions: No      Mobility  Bed Mobility Overal bed mobility: Needs Assistance Bed Mobility: Supine to Sit     Supine to sit: Min guard     General bed mobility comments: Safe technique performed with slow transfer. Pt cued for sequencing tasks and able to follow commands. Pt able to scoot out towards EOB and demonstrate fairly upright posture.  Transfers Overall transfer level: Needs assistance Equipment used: Rolling walker (2 wheeled) Transfers: Sit to/from Stand Sit to Stand: Min assist         General transfer comment: safe technique performed with pt pushing from seated surface. Once standing, pt with good balance.  Ambulation/Gait Ambulation/Gait assistance: Min guard Ambulation Distance (Feet): 3 Feet Assistive device: Rolling walker (2 wheeled) Gait Pattern/deviations: Step-to pattern     General Gait Details: ambulated to recliner with step to gait pattern, slow gait speed. No LOB  noted during ambulation. Breakfast tray in room, with pt wanting to eat, limited further ambulation at this time  Stairs            Wheelchair Mobility    Modified Rankin (Stroke Patients Only)       Balance Overall balance assessment: Needs assistance Sitting-balance support: Feet supported Sitting balance-Leahy Scale: Good     Standing balance support: Bilateral upper extremity supported Standing balance-Leahy Scale: Good                               Pertinent Vitals/Pain Pain Assessment: No/denies pain    Home Living Family/patient expects to be discharged to:: Assisted living               Home Equipment: Walker - 4 wheels      Prior Function Level of Independence: Needs assistance         Comments: Pt appears to have some assistance at ALF, however extent is unsure, as pt is poor historian     Hand Dominance        Extremity/Trunk Assessment   Upper Extremity Assessment: Overall WFL for tasks assessed           Lower Extremity Assessment: Generalized weakness (B LE grossly 4/5)         Communication   Communication: No difficulties (noted R side facial droop)  Cognition Arousal/Alertness: Lethargic Behavior During Therapy: WFL for tasks assessed/performed Overall Cognitive Status: History of cognitive impairments - at baseline  General Comments      Exercises Other Exercises Other Exercises: Seated ther-ex performed including B LE ankle pumps, LAQ, heel slides, and SLRs. All ther-ex performed x 10 reps with cga.      Assessment/Plan    PT Assessment Patient needs continued PT services  PT Diagnosis Difficulty walking;Generalized weakness   PT Problem List Decreased strength;Decreased balance;Decreased mobility  PT Treatment Interventions DME instruction;Gait training;Therapeutic exercise   PT Goals (Current goals can be found in the Care Plan section) Acute Rehab PT Goals Patient  Stated Goal: none stated PT Goal Formulation: Patient unable to participate in goal setting Time For Goal Achievement: 03/08/16 Potential to Achieve Goals: Good    Frequency Min 2X/week   Barriers to discharge        Co-evaluation               End of Session Equipment Utilized During Treatment: Gait belt Activity Tolerance: Patient tolerated treatment well Patient left: in chair;with chair alarm set Nurse Communication: Mobility status    Functional Assessment Tool Used: clinical judgement Functional Limitation: Mobility: Walking and moving around Mobility: Walking and Moving Around Current Status [phone]): At least 1 percent but less than 20 percent impaired, limited or restricted Mobility: Walking and Moving Around Goal Status [phone]): 0 percent impaired, limited or restricted    Time: [id number] PT Time Calculation (min) (ACUTE ONLY): 20 min   Charges:   PT Evaluation $PT Eval Moderate Complexity: 1 Procedure PT Treatments $Therapeutic Exercise: 8-22 mins   PT G Codes:   PT G-Codes **NOT FOR INPATIENT CLASS** Functional Assessment Tool Used: clinical judgement Functional Limitation: Mobility: Walking and moving around Mobility: Walking and Moving Around Current Status [id number]): At least 1 percent but less than 20 percent impaired, limited or restricted Mobility: Walking and Moving Around Goal Status [phone]): 0 percent impaired, limited or restricted    Kamarii Buren 02/23/2016, 11:18 AM Greggory Stallion, PT, DPT [phone]

## 2016-02-23 NOTE — Progress Notes (Signed)
Patient lethargic at 0100 neuro check,also a few second of SVT HR 168 per telemetry clerk, vital signs stable, MD notified, CT scan and ativan and  trilafon discontinued  per MD.

## 2016-02-23 NOTE — Progress Notes (Signed)
Subjective: Patient admitted with facial droop and confusion.   Objective: Vital signs in last 24 hours: Temp:  [97.6 F (36.4 C)-98.7 F (37.1 C)] 98 F (36.7 C) (06/27 1400) Pulse Rate:  [62-93] 70 (06/27 1400) Resp:  [16-27] 20 (06/27 1400) BP: (101-148)/(39-83) 130/39 mmHg (06/27 1400) SpO2:  [91 %-100 %] 98 % (06/27 1400) Weight:  [49.034 kg (108 lb 1.6 oz)-52.3 kg (115 lb 4.8 oz)] 49.034 kg (108 lb 1.6 oz) (06/26 2124) Weight change:  Last BM Date: 02/23/16  Intake/Output from previous day: 06/26 0701 - 06/27 0700 In: -  Out: 1 [Urine:1] Intake/Output this shift:    General appearance: cooperative Head: Normocephalic, without obvious abnormality, atraumatic Eyes: conjunctivae/corneas clear. PERRL, EOM's intact. Fundi benign. Resp: clear to auscultation bilaterally and normal percussion bilaterally Cardio: regular rate and rhythm, S1, S2 normal, no murmur, click, rub or gallop GI: soft, non-tender; bowel sounds normal; no masses,  no organomegaly Extremities: extremities normal, atraumatic, no cyanosis or edema Neurologic: Motor: grossly normal  Lab Results:  Recent Labs  02/22/16 1703 02/23/16 0436  WBC 9.1 6.6  HGB 12.8 11.0*  HCT 38.5 32.9*  PLT 838* 695*   BMET  Recent Labs  02/22/16 1703 02/23/16 0436  NA 146* 144  K 3.8 3.6  CL 111 114*  CO2 25 24  GLUCOSE 90 77  BUN 16 14  CREATININE 1.03* 0.80  CALCIUM 9.3 8.1*    Studies/Results: Dg Chest 2 View  02/22/2016  CLINICAL DATA:  Dementia.  Altered mental status. EXAM: CHEST  2 VIEW COMPARISON:  01/20/2016 FINDINGS: Atherosclerotic calcification of the aortic arch. Aortic tortuosity. Biapical pleural parenchymal scarring. Mild pectus excavatum. Chronic interstitial accentuation.  No pleural effusion. IMPRESSION: 1. Chronic interstitial accentuation in both lungs, unchanged. 2. Atherosclerotic aortic arch. Electronically Signed   By: Van Clines M.D.   On: 02/22/2016 17:49   Ct Head Wo  Contrast  02/23/2016  CLINICAL DATA:  Altered mental status, weakness, lethargy. EXAM: CT HEAD WITHOUT CONTRAST TECHNIQUE: Contiguous axial images were obtained from the base of the skull through the vertex without intravenous contrast. COMPARISON:  02/22/2016 FINDINGS: There is no intracranial hemorrhage, mass or evidence of acute infarction. There is mild generalized atrophy. There is mild chronic microvascular ischemic change. There is no significant extra-axial fluid collection. No acute intracranial findings are evident. The calvarium and skullbase are intact. Visible paranasal sinuses and orbits are unremarkable. IMPRESSION: No acute findings. There is mild atrophy and chronic appearing white matter hypodensity. Electronically Signed   By: Andreas Newport M.D.   On: 02/23/2016 02:18   Ct Head Wo Contrast  02/22/2016  CLINICAL DATA:  Altered mental status.  History dementia. EXAM: CT HEAD WITHOUT CONTRAST TECHNIQUE: Contiguous axial images were obtained from the base of the skull through the vertex without intravenous contrast. COMPARISON:  Head CT dated 01/20/2016. FINDINGS: Brain: Again noted is mild generalized age related brain atrophy with commensurate dilatation of the ventricles and sulci. There is no mass, hemorrhage, edema or other evidence of acute parenchymal abnormality. No extra-axial hemorrhage. Vascular: No hyperdense vessel or unexpected calcification. There are chronic calcified atherosclerotic changes of the large vessels at the skull base. Skull: Negative for fracture or focal lesion. Sinuses/Orbits: No acute findings. Visualized upper paranasal sinuses are clear. Mastoid air cells are clear. Other: None. IMPRESSION: No acute findings.  No intracranial mass, hemorrhage or edema. Electronically Signed   By: Franki Cabot M.D.   On: 02/22/2016 18:39   Mr Brain Wo Contrast  02/23/2016  CLINICAL DATA:  Right facial droop.  Confusion and lethargy. EXAM: MRI HEAD WITHOUT CONTRAST  TECHNIQUE: Multiplanar, multiecho pulse sequences of the brain and surrounding structures were obtained without intravenous contrast. COMPARISON:  Head CT 02/23/2016 FINDINGS: There is mild motion artifact throughout. There is no evidence of acute infarct, intracranial hemorrhage, mass, midline shift, or extra-axial fluid collection. There is mild-to-moderate cerebral atrophy. T2 hyperintensities in the periventricular white matter and pons are nonspecific but compatible with mild chronic small vessel ischemic disease. Orbits are unremarkable. Paranasal sinuses and mastoid air cells are clear. Major intracranial vascular flow voids are preserved, with the left vertebral artery dominant. IMPRESSION: 1. No acute intracranial abnormality. 2. Mild chronic small vessel ischemic disease. Electronically Signed   By: Logan Bores M.D.   On: 02/23/2016 12:08   US Carotid Bilateral  02/23/2016  CLINICAL DATA:  TIA, hypertension and diabetes. EXAM: BILATERAL CAROTID DUPLEX ULTRASOUND TECHNIQUE: Pearline Cables scale imaging, color Doppler and duplex ultrasound were performed of bilateral carotid and vertebral arteries in the neck. COMPARISON:  None. FINDINGS: Criteria: Quantification of carotid stenosis is based on velocity parameters that correlate the residual internal carotid diameter with NASCET-based stenosis levels, using the diameter of the distal internal carotid lumen as the denominator for stenosis measurement. The following velocity measurements were obtained: RIGHT ICA:  79/15 cm/sec CCA:  [id number] cm/sec SYSTOLIC ICA/CCA RATIO:  1.1 DIASTOLIC ICA/CCA RATIO:  1.5 ECA:  125 cm/sec LEFT ICA:  95/19 cm/sec CCA:  [id number] cm/sec SYSTOLIC ICA/CCA RATIO:  1.1 DIASTOLIC ICA/CCA RATIO:  1.8 ECA:  139 cm/sec RIGHT CAROTID ARTERY: The common and internal carotid arteries are tortuous. Minimal noncalcified plaque is noted in the carotid bulb. No evidence of ICA plaque or stenosis. RIGHT VERTEBRAL ARTERY: Antegrade flow with normal waveform  and velocity. LEFT CAROTID ARTERY: Carotid arteries are tortuous in the left neck. Mild partially calcified plaque is noted at the ICA origin. Velocities and waveforms are normal and estimated left ICA stenosis is less than 50%. Minimal plaque also visualized in the common carotid artery. LEFT VERTEBRAL ARTERY: Antegrade flow with normal waveform and velocity. IMPRESSION: 1. Mild plaque in the proximal left ICA with estimated left ICA stenosis of less than 50%. 2. No evidence of plaque or stenosis in the right internal carotid artery. Electronically Signed   By: Aletta Edouard M.D.   On: 02/23/2016 11:12    Medications:  I have reviewed the patient's current medications. Scheduled: . [START ON 02/24/2016] aspirin EC  325 mg Oral Daily  . docusate sodium  100 mg Oral BID  . donepezil  10 mg Oral QHS  . enoxaparin (LOVENOX) injection  40 mg Subcutaneous Q24H  . memantine  28 mg Oral QHS  . sodium chloride flush  3 mL Intravenous Q12H   Continuous: . 0.45 % NaCl with KCl 20 mEq / L 125 mL/hr at 02/23/16 0609    Assessment/Plan: 1. Possible TIA: Facial droop has resolved. MRI shows nothing new. Echo pending. Will increase ASA to full dose.  2. Confusion: Likely worsening dementia. Observe for now.  3. HTN: Controlled.  4. Weakness: PT consulted.  Time= 25 min     Baxter Hire 02/23/2016, 3:11 PM

## 2016-02-23 NOTE — Progress Notes (Signed)
*  PRELIMINARY RESULTS* Echocardiogram 2D Echocardiogram has been performed.  Savannah Young 02/23/2016, 2:09 PM

## 2016-02-24 MED ORDER — ATORVASTATIN CALCIUM 20 MG PO TABS: 20.0000 mg | ORAL_TABLET | Freq: Every day | ORAL | Status: DC

## 2016-02-24 MED ORDER — ASPIRIN 325 MG PO TBEC: 325.0000 mg | DELAYED_RELEASE_TABLET | Freq: Every day | ORAL | Status: DC

## 2016-02-24 NOTE — NC FL2 (Signed)
[place] LEVEL OF CARE SCREENING TOOL     IDENTIFICATION  Patient Name: Savannah Young Birthdate: May 16, 1932 Sex: female Admission Date (Current Location): 02/22/2016  [facility] and [place] Number:  Engineering geologist and Address:  [facility], [address], [place], [place] [address]      Provider Number: [id number]  Attending Physician Name and Address:  Bettey Costa, MD  Relative Name and Phone Number:       Current Level of Care: Domiciliary (Rest home) Recommended Level of Care: Memory Care Prior Approval Number:    Date Approved/Denied:   PASRR Number:    Discharge Plan: Domiciliary (Rest home)    Current Diagnoses: Patient Active Problem List   Diagnosis Date Noted  . Pressure ulcer 02/23/2016  . Confusion 02/22/2016  . Agitation 01/20/2016  Alzheimer's  Orientation RESPIRATION BLADDER Height & Weight     Self  Normal Continent Weight: 113 lb 2 oz (51.313 kg) Height:  5\' 3"  (160 cm)  BEHAVIORAL SYMPTOMS/MOOD NEUROLOGICAL BOWEL NUTRITION STATUS      Continent Regular Diet  AMBULATORY STATUS COMMUNICATION OF NEEDS Skin   Limited Assist Verbally Normal                       Personal Care Assistance Level of Assistance  Bathing, Feeding, Dressing Bathing Assistance: Limited assistance Feeding assistance: Independent Dressing Assistance: Limited assistance     Functional Limitations Info  Sight, Hearing, Speech Sight Info: Adequate Hearing Info: Adequate Speech Info: Adequate    SPECIAL CARE FACTORS FREQUENCY  PT (By licensed PT)     PT Frequency: Home Health PT through [facility]              Contractures      Additional Factors Info  Code Status, Allergies Code Status Info: Full Code  Allergies Info: No known allergies           Discharge Medications: Please see discharge summary for a list of discharge medications.  Current Discharge Medication List    START taking these  medications   Details  aspirin EC 325 MG EC tablet Take 1 tablet (325 mg total) by mouth daily. Qty: 30 tablet, Refills: 0    atorvastatin (LIPITOR) 20 MG tablet Take 1 tablet (20 mg total) by mouth daily. Qty: 30 tablet, Refills: 0      CONTINUE these medications which have NOT CHANGED   Details  acetaminophen (TYLENOL) 500 MG tablet Take 500 mg by mouth 3 (three) times daily as needed for mild pain.    albuterol (PROVENTIL HFA;VENTOLIN HFA) 108 (90 Base) MCG/ACT inhaler Inhale 2 puffs into the lungs every 4 (four) hours as needed for wheezing or shortness of breath.    cholecalciferol (VITAMIN D) 1000 units tablet Take 2,000 Units by mouth daily.    Cranberry 425 MG CAPS Take 425 mg by mouth daily.    cyanocobalamin 500 MCG tablet Take 500 mcg by mouth daily.    diphenhydrAMINE (BENADRYL) 25 mg capsule Take 25 mg by mouth every 4 (four) hours as needed for itching.    donepezil (ARICEPT) 10 MG tablet Take 10 mg by mouth at bedtime.    guaifenesin (ROBITUSSIN) 100 MG/5ML syrup Take 200 mg by mouth 3 (three) times daily as needed for cough.    LORazepam (ATIVAN) 0.5 MG tablet Take 0.5 mg by mouth at bedtime. Pt is also able to take twice daily as needed for anxiety.    memantine (NAMENDA  XR) 28 MG CP24 24 hr capsule Take 28 mg by mouth at bedtime.    risperiDONE (RISPERDAL M-TABS) 0.5 MG disintegrating tablet Take 0.5 mg by mouth 2 (two) times daily as needed (for agitation).      STOP taking these medications     fexofenadine (ALLEGRA) 180 MG tablet      perphenazine (TRILAFON) 8 MG tablet              Relevant Imaging Results:  Relevant Lab Results:   Additional Information SSN:  [id number]  Pt will have [place] following  Darden Dates, LCSW

## 2016-02-24 NOTE — Discharge Summary (Addendum)
[facility] NAME: Savannah Young    MR#:  [id number]  DATE OF BIRTH:  1931/12/08  DATE OF ADMISSION:  02/22/2016 ADMITTING PHYSICIAN: Hillary Bow, MD  DATE OF DISCHARGE: *02/24/2016  PRIMARY CARE PHYSICIAN: No primary care provider on file.    ADMISSION DIAGNOSIS:  TIA (transient ischemic attack) [G45.9] Facial droop [R29.810] Thrombocytosis ([place]) [D47.3] Altered mental status, unspecified altered mental status type [R41.82]  DISCHARGE DIAGNOSIS:  Active Problems:   Confusion     SECONDARY DIAGNOSIS:   Past Medical History  Diagnosis Date  . Dementia   . Hypertension   . Diabetes mellitus ([place])   . COPD (chronic obstructive pulmonary disease) [facility])     HOSPITAL COURSE:   82 y.o. female with a known history of Hypertension, COPD, dementia presents to the emergency room from assisted living due to being more lethargic and confused.   1. TIA: Patient presented with facial droop and increasing lethargy adn confusion. Her facial droop has resolved. MRI brain negative for acute CVA. ECHO with normal EF and Carotids without hemodynamically significant stenosis. She was started on ASA and statin.  2. Dementia; Continue outpatient medications  3. HTN: Blood pressure controlled without medications.    DISCHARGE CONDITIONS AND DIET:   Stable condition Regular diet  CONSULTS OBTAINED:  Treatment Team:  Lloyd Huger, MD  DRUG ALLERGIES:  No Known Allergies  DISCHARGE MEDICATIONS:   Current Discharge Medication List    START taking these medications   Details  aspirin EC 325 MG EC tablet Take 1 tablet (325 mg total) by mouth daily. Qty: 30 tablet, Refills: 0    atorvastatin (LIPITOR) 20 MG tablet Take 1 tablet (20 mg total) by mouth daily. Qty: 30 tablet, Refills: 0      CONTINUE these medications which have NOT CHANGED   Details  acetaminophen (TYLENOL) 500 MG tablet Take 500 mg by mouth 3 (three)  times daily as needed for mild pain.    albuterol (PROVENTIL HFA;VENTOLIN HFA) 108 (90 Base) MCG/ACT inhaler Inhale 2 puffs into the lungs every 4 (four) hours as needed for wheezing or shortness of breath.    cholecalciferol (VITAMIN D) 1000 units tablet Take 2,000 Units by mouth daily.    Cranberry 425 MG CAPS Take 425 mg by mouth daily.    cyanocobalamin 500 MCG tablet Take 500 mcg by mouth daily.    diphenhydrAMINE (BENADRYL) 25 mg capsule Take 25 mg by mouth every 4 (four) hours as needed for itching.    donepezil (ARICEPT) 10 MG tablet Take 10 mg by mouth at bedtime.    guaifenesin (ROBITUSSIN) 100 MG/5ML syrup Take 200 mg by mouth 3 (three) times daily as needed for cough.    LORazepam (ATIVAN) 0.5 MG tablet Take 0.5 mg by mouth at bedtime. Pt is also able to take twice daily as needed for anxiety.    memantine (NAMENDA XR) 28 MG CP24 24 hr capsule Take 28 mg by mouth at bedtime.    risperiDONE (RISPERDAL M-TABS) 0.5 MG disintegrating tablet Take 0.5 mg by mouth 2 (two) times daily as needed (for agitation).      STOP taking these medications     fexofenadine (ALLEGRA) 180 MG tablet      perphenazine (TRILAFON) 8 MG tablet               Today   CHIEF COMPLAINT:  no acute issues overnight   VITAL SIGNS:  Blood pressure 125/74, pulse  73, temperature 98.1 F (36.7 C), temperature source Oral, resp. rate 18, height 5\' 3"  (1.6 m), weight 51.313 kg (113 lb 2 oz), last menstrual period 01/20/2016, SpO2 99 %.   REVIEW OF SYSTEMS:  Review of Systems  Unable to perform ROS: dementia     PHYSICAL EXAMINATION:  GENERAL:  82 y.o.-year-old patient lying in the bed with no acute distress.  NECK:  Supple, no jugular venous distention. No thyroid enlargement, no tenderness.  LUNGS: Normal breath sounds bilaterally, no wheezing, rales,rhonchi  No use of accessory muscles of respiration.  CARDIOVASCULAR: S1, S2 normal. No murmurs, rubs, or gallops.  ABDOMEN: Soft,  non-tender, non-distended. Bowel sounds present. No organomegaly or mass.  EXTREMITIES: No pedal edema, cyanosis, or clubbing.  PSYCHIATRIC: The patient is alert and oriented x name  SKIN: No obvious rash, lesion, or ulcer.   DATA REVIEW:   CBC  Recent Labs Lab 02/23/16 0436  WBC 6.6  HGB 11.0*  HCT 32.9*  PLT 695*    Chemistries   Recent Labs Lab 02/22/16 1703 02/23/16 0436  NA 146* 144  K 3.8 3.6  CL 111 114*  CO2 25 24  GLUCOSE 90 77  BUN 16 14  CREATININE 1.03* 0.80  CALCIUM 9.3 8.1*  AST 48*  --   ALT 18  --   ALKPHOS 87  --   BILITOT 0.7  --     Cardiac Enzymes  Recent Labs Lab 02/22/16 1703  TROPONINI 0.03    Microbiology Results  @[email or web address] @  RADIOLOGY:  Dg Chest 2 View  02/22/2016  CLINICAL DATA:  Dementia.  Altered mental status. EXAM: CHEST  2 VIEW COMPARISON:  01/20/2016 FINDINGS: Atherosclerotic calcification of the aortic arch. Aortic tortuosity. Biapical pleural parenchymal scarring. Mild pectus excavatum. Chronic interstitial accentuation.  No pleural effusion. IMPRESSION: 1. Chronic interstitial accentuation in both lungs, unchanged. 2. Atherosclerotic aortic arch. Electronically Signed   By: Van Clines M.D.   On: 02/22/2016 17:49   Ct Head Wo Contrast  02/23/2016  CLINICAL DATA:  Altered mental status, weakness, lethargy. EXAM: CT HEAD WITHOUT CONTRAST TECHNIQUE: Contiguous axial images were obtained from the base of the skull through the vertex without intravenous contrast. COMPARISON:  02/22/2016 FINDINGS: There is no intracranial hemorrhage, mass or evidence of acute infarction. There is mild generalized atrophy. There is mild chronic microvascular ischemic change. There is no significant extra-axial fluid collection. No acute intracranial findings are evident. The calvarium and skullbase are intact. Visible paranasal sinuses and orbits are unremarkable. IMPRESSION: No acute findings. There is mild atrophy and chronic appearing  white matter hypodensity. Electronically Signed   By: Andreas Newport M.D.   On: 02/23/2016 02:18   Ct Head Wo Contrast  02/22/2016  CLINICAL DATA:  Altered mental status.  History dementia. EXAM: CT HEAD WITHOUT CONTRAST TECHNIQUE: Contiguous axial images were obtained from the base of the skull through the vertex without intravenous contrast. COMPARISON:  Head CT dated 01/20/2016. FINDINGS: Brain: Again noted is mild generalized age related brain atrophy with commensurate dilatation of the ventricles and sulci. There is no mass, hemorrhage, edema or other evidence of acute parenchymal abnormality. No extra-axial hemorrhage. Vascular: No hyperdense vessel or unexpected calcification. There are chronic calcified atherosclerotic changes of the large vessels at the skull base. Skull: Negative for fracture or focal lesion. Sinuses/Orbits: No acute findings. Visualized upper paranasal sinuses are clear. Mastoid air cells are clear. Other: None. IMPRESSION: No acute findings.  No intracranial mass, hemorrhage or edema. Electronically Signed  By: Franki Cabot M.D.   On: 02/22/2016 18:39   Mr Brain Wo Contrast  02/23/2016  CLINICAL DATA:  Right facial droop.  Confusion and lethargy. EXAM: MRI HEAD WITHOUT CONTRAST TECHNIQUE: Multiplanar, multiecho pulse sequences of the brain and surrounding structures were obtained without intravenous contrast. COMPARISON:  Head CT 02/23/2016 FINDINGS: There is mild motion artifact throughout. There is no evidence of acute infarct, intracranial hemorrhage, mass, midline shift, or extra-axial fluid collection. There is mild-to-moderate cerebral atrophy. T2 hyperintensities in the periventricular white matter and pons are nonspecific but compatible with mild chronic small vessel ischemic disease. Orbits are unremarkable. Paranasal sinuses and mastoid air cells are clear. Major intracranial vascular flow voids are preserved, with the left vertebral artery dominant. IMPRESSION: 1.  No acute intracranial abnormality. 2. Mild chronic small vessel ischemic disease. Electronically Signed   By: Logan Bores M.D.   On: 02/23/2016 12:08   US Carotid Bilateral  02/23/2016  CLINICAL DATA:  TIA, hypertension and diabetes. EXAM: BILATERAL CAROTID DUPLEX ULTRASOUND TECHNIQUE: Pearline Cables scale imaging, color Doppler and duplex ultrasound were performed of bilateral carotid and vertebral arteries in the neck. COMPARISON:  None. FINDINGS: Criteria: Quantification of carotid stenosis is based on velocity parameters that correlate the residual internal carotid diameter with NASCET-based stenosis levels, using the diameter of the distal internal carotid lumen as the denominator for stenosis measurement. The following velocity measurements were obtained: RIGHT ICA:  79/15 cm/sec CCA:  [id number] cm/sec SYSTOLIC ICA/CCA RATIO:  1.1 DIASTOLIC ICA/CCA RATIO:  1.5 ECA:  125 cm/sec LEFT ICA:  95/19 cm/sec CCA:  [id number] cm/sec SYSTOLIC ICA/CCA RATIO:  1.1 DIASTOLIC ICA/CCA RATIO:  1.8 ECA:  139 cm/sec RIGHT CAROTID ARTERY: The common and internal carotid arteries are tortuous. Minimal noncalcified plaque is noted in the carotid bulb. No evidence of ICA plaque or stenosis. RIGHT VERTEBRAL ARTERY: Antegrade flow with normal waveform and velocity. LEFT CAROTID ARTERY: Carotid arteries are tortuous in the left neck. Mild partially calcified plaque is noted at the ICA origin. Velocities and waveforms are normal and estimated left ICA stenosis is less than 50%. Minimal plaque also visualized in the common carotid artery. LEFT VERTEBRAL ARTERY: Antegrade flow with normal waveform and velocity. IMPRESSION: 1. Mild plaque in the proximal left ICA with estimated left ICA stenosis of less than 50%. 2. No evidence of plaque or stenosis in the right internal carotid artery. Electronically Signed   By: Aletta Edouard M.D.   On: 02/23/2016 11:12       Stable for discharge AL with [place]  Patient should follow up with MD at facility 1  week  CODE STATUS:     Code Status Orders        Start     Ordered   02/22/16 2010  Full code   Continuous     02/22/16 2010    Code Status History    Date Active Date Inactive Code Status Order ID Comments User Context   01/20/2016  7:47 PM 01/21/2016  4:40 PM Full Code [id number]  Gladstone Lighter, MD ED      TOTAL TIME TAKING CARE OF THIS PATIENT: 36 minutes.    Note: This dictation was prepared with Dragon dictation along with smaller phrase technology. Any transcriptional errors that result from this process are unintentional.  Moreen Piggott M.D on 02/24/2016 at 10:09 AM  Between 7am to 6pm - Pager - [phone] After 6pm go to [email or web address] - Proofreader  [employer]  [phone]  CC: Primary care physician; No primary care provider on file.

## 2016-02-24 NOTE — Progress Notes (Signed)
Checked to see if pre-authorization would be needed for non-emergent EMS transport. Per [employer] benefits obtained online through [employer], patient has a [employer] Medicare Advantage PPO policy.  Medicare PPO plans do not require pre-auth for non-emergent ground transports using service codes A0426 or [id number].   

## 2016-02-24 NOTE — Progress Notes (Signed)
Pt to go back to [facility]. Caregiver here to transport via  [facility]. No resp distress.  No c/o

## 2016-02-24 NOTE — Clinical Social Work Note (Signed)
Clinical Social Work Assessment  Patient Details  Name: Savannah Young MRN: [id number] Date of Birth: 07/31/1932  Date of referral:  02/24/16               Reason for consult:  Facility Placement                Permission sought to share information with:  Family Supports Permission granted to share information::  Yes, Verbal Permission Granted  Name::     Oscar Miller  Relationship::  brother  Contact Information:  [phone]  Housing/Transportation Living arrangements for the past 2 months:  Assisted Living Facility Source of Information:  Other (Comment Required), Facility (Brother) Patient Interpreter Needed:  None Criminal Activity/Legal Involvement Pertinent to Current Situation/Hospitalization:  No - Comment as needed Significant Relationships:  Siblings, Other(Comment) (Facility supports) Lives with:  Facility Resident Do you feel safe going back to the place where you live?  Yes Need for family participation in patient care:  Yes (Comment)  Care giving concerns:  No care giving concerns identified.   Social Worker assessment / plan:  CSW met with pt to address consult as pt was admitted from [facility], where she is in the memory care unit. CSW introduced herself. Pt is pleasantly confused. Pt shared that she would be speaking with her brother to help her return to the facility. CSW called pt's brother to address consult. CSW introduce herself and explained role of social work. CSW also explained the process of returning to facility, and PT is recommending HHPT. Pt's brother is agreeable to discharge plan. CSW spoke with Ms. Worth at [facility]. Pt is able to return with home health services provided by [facility]. Facility will provide transportation. CSW will send discharge information to facility. CSW will also update RN. Pt's brother is aware and agreeable to discharge plan. CSW is signing off as no further needs identified.   Employment status:  Retired Insurance  information:  Managed Medicare PT Recommendations:  Home with Home Health Information / Referral to community resources:  Other (Comment Required) ([facility])  Patient/Family's Response to care:  Pt's brother was appreciative of CSW support.   Patient/Family's Understanding of and Emotional Response to Diagnosis, Current Treatment, and Prognosis:  Pt's brother understands that pt will benefit from returning to familiar surroundings with HHPT.   Emotional Assessment Appearance:  Appears stated age Attitude/Demeanor/Rapport:  Other (Appropriate) Affect (typically observed):  Guarded Orientation:  Fluctuating Orientation (Suspected and/or reported Sundowners), Oriented to Self Alcohol / Substance use:  Never Used Psych involvement (Current and /or in the community):  No (Comment)  Discharge Needs  Concerns to be addressed:  Care Coordination Readmission within the last 30 days:  No Current discharge risk:  None Barriers to Discharge:  No Barriers Identified    , LCSW 02/24/2016, 11:21 AM  

## 2016-03-07 ENCOUNTER — Emergency Department: Payer: Medicare Other

## 2016-03-07 ENCOUNTER — Observation Stay
Admission: EM | Admit: 2016-03-07 | Discharge: 2016-03-09 | Payer: Medicare Other | Attending: Internal Medicine | Admitting: Internal Medicine

## 2016-03-07 ENCOUNTER — Observation Stay: Payer: Medicare Other

## 2016-03-07 DIAGNOSIS — I10 Essential (primary) hypertension: Secondary | ICD-10-CM | POA: Insufficient documentation

## 2016-03-07 DIAGNOSIS — Z8249 Family history of ischemic heart disease and other diseases of the circulatory system: Secondary | ICD-10-CM | POA: Insufficient documentation

## 2016-03-07 DIAGNOSIS — R29810 Facial weakness: Secondary | ICD-10-CM | POA: Insufficient documentation

## 2016-03-07 DIAGNOSIS — Y92122 Bedroom in nursing home as the place of occurrence of the external cause: Secondary | ICD-10-CM | POA: Diagnosis not present

## 2016-03-07 DIAGNOSIS — G459 Transient cerebral ischemic attack, unspecified: Secondary | ICD-10-CM

## 2016-03-07 DIAGNOSIS — E876 Hypokalemia: Secondary | ICD-10-CM | POA: Insufficient documentation

## 2016-03-07 DIAGNOSIS — G9340 Encephalopathy, unspecified: Secondary | ICD-10-CM | POA: Diagnosis not present

## 2016-03-07 DIAGNOSIS — Z7982 Long term (current) use of aspirin: Secondary | ICD-10-CM | POA: Insufficient documentation

## 2016-03-07 DIAGNOSIS — G92 Toxic encephalopathy: Secondary | ICD-10-CM | POA: Diagnosis not present

## 2016-03-07 DIAGNOSIS — R1310 Dysphagia, unspecified: Secondary | ICD-10-CM | POA: Insufficient documentation

## 2016-03-07 DIAGNOSIS — T424X5A Adverse effect of benzodiazepines, initial encounter: Secondary | ICD-10-CM | POA: Insufficient documentation

## 2016-03-07 DIAGNOSIS — F0390 Unspecified dementia without behavioral disturbance: Secondary | ICD-10-CM | POA: Diagnosis not present

## 2016-03-07 DIAGNOSIS — E119 Type 2 diabetes mellitus without complications: Secondary | ICD-10-CM | POA: Diagnosis not present

## 2016-03-07 DIAGNOSIS — J449 Chronic obstructive pulmonary disease, unspecified: Secondary | ICD-10-CM | POA: Insufficient documentation

## 2016-03-07 DIAGNOSIS — Z79899 Other long term (current) drug therapy: Secondary | ICD-10-CM | POA: Diagnosis not present

## 2016-03-07 LAB — COMPREHENSIVE METABOLIC PANEL
ALBUMIN: 3.5 g/dL (ref 3.5–5.0)
ALT: 16 U/L (ref 14–54)
ANION GAP: 10 (ref 5–15)
AST: 31 U/L (ref 15–41)
Alkaline Phosphatase: 77 U/L (ref 38–126)
BILIRUBIN TOTAL: 0.4 mg/dL (ref 0.3–1.2)
BUN: 23 mg/dL — ABNORMAL HIGH (ref 6–20)
CO2: 21 mmol/L — ABNORMAL LOW (ref 22–32)
Calcium: 8.5 mg/dL — ABNORMAL LOW (ref 8.9–10.3)
Chloride: 112 mmol/L — ABNORMAL HIGH (ref 101–111)
Creatinine, Ser: 1.09 mg/dL — ABNORMAL HIGH (ref 0.44–1.00)
GFR, EST AFRICAN AMERICAN: 53 mL/min — AB (ref >60)
GFR, EST NON AFRICAN AMERICAN: 46 mL/min — AB (ref >60)
Glucose, Bld: 247 mg/dL — ABNORMAL HIGH (ref 65–99)
POTASSIUM: 3.8 mmol/L (ref 3.5–5.1)
Sodium: 143 mmol/L (ref 135–145)
TOTAL PROTEIN: 6.1 g/dL — AB (ref 6.5–8.1)

## 2016-03-07 LAB — LIPID PANEL
Cholesterol: 148 mg/dL (ref 0–200)
HDL: 47 mg/dL (ref >40)
LDL CALC: 93 mg/dL (ref 0–99)
Total CHOL/HDL Ratio: 3.1 RATIO
Triglycerides: 41 mg/dL (ref <150)
VLDL: 8 mg/dL (ref 0–40)

## 2016-03-07 LAB — URINALYSIS COMPLETE WITH MICROSCOPIC (ARMC ONLY)
Bacteria, UA: NONE SEEN
Bilirubin Urine: NEGATIVE
GLUCOSE, UA: NEGATIVE mg/dL
Hgb urine dipstick: NEGATIVE
LEUKOCYTES UA: NEGATIVE
NITRITE: NEGATIVE
Protein, ur: 30 mg/dL — AB
SPECIFIC GRAVITY, URINE: 1.025 (ref 1.005–1.030)
pH: 5 (ref 5.0–8.0)

## 2016-03-07 LAB — URINE DRUG SCREEN, QUALITATIVE (ARMC ONLY)
AMPHETAMINES, UR SCREEN: NOT DETECTED
Barbiturates, Ur Screen: NOT DETECTED
Benzodiazepine, Ur Scrn: POSITIVE — AB
COCAINE METABOLITE, UR ~~LOC~~: NOT DETECTED
Cannabinoid 50 Ng, Ur ~~LOC~~: NOT DETECTED
MDMA (Ecstasy)Ur Screen: NOT DETECTED
METHADONE SCREEN, URINE: NOT DETECTED
OPIATE, UR SCREEN: NOT DETECTED
Phencyclidine (PCP) Ur S: NOT DETECTED
TRICYCLIC, UR SCREEN: NOT DETECTED

## 2016-03-07 LAB — PROTIME-INR
INR: 1.19
Prothrombin Time: 15.3 seconds — ABNORMAL HIGH (ref 11.4–15.0)

## 2016-03-07 LAB — CBC WITH DIFFERENTIAL/PLATELET
BASOS PCT: 1 %
Basophils Absolute: 0.1 10*3/uL (ref 0–0.1)
Eosinophils Absolute: 0 10*3/uL (ref 0–0.7)
Eosinophils Relative: 0 %
HEMATOCRIT: 38.3 % (ref 35.0–47.0)
Hemoglobin: 12.8 g/dL (ref 12.0–16.0)
LYMPHS ABS: 0.7 10*3/uL — AB (ref 1.0–3.6)
Lymphocytes Relative: 6 %
MCH: 27.3 pg (ref 26.0–34.0)
MCHC: 33.4 g/dL (ref 32.0–36.0)
MCV: 81.8 fL (ref 80.0–100.0)
MONO ABS: 0.7 10*3/uL (ref 0.2–0.9)
MONOS PCT: 6 %
NEUTROS ABS: 10.3 10*3/uL — AB (ref 1.4–6.5)
Neutrophils Relative %: 87 %
Platelets: 897 10*3/uL — ABNORMAL HIGH (ref 150–440)
RBC: 4.68 MIL/uL (ref 3.80–5.20)
RDW: 15.7 % — AB (ref 11.5–14.5)
WBC: 11.7 10*3/uL — AB (ref 3.6–11.0)

## 2016-03-07 LAB — GLUCOSE, CAPILLARY: Glucose-Capillary: 262 mg/dL — ABNORMAL HIGH (ref 65–99)

## 2016-03-07 LAB — MRSA PCR SCREENING: MRSA BY PCR: NEGATIVE

## 2016-03-07 LAB — APTT: aPTT: 28 seconds (ref 24–36)

## 2016-03-07 LAB — TROPONIN I

## 2016-03-07 LAB — ETHANOL: Alcohol, Ethyl (B): <5 mg/dL (ref <5)

## 2016-03-07 MED ORDER — VITAMIN D 1000 UNITS PO TABS
2000.0000 [IU] | ORAL_TABLET | Freq: Every day | ORAL | Status: DC
  Administered 2016-03-08 – 2016-03-09 (×2): 2000 [IU] via ORAL
  Filled 2016-03-07 (×2): qty 2

## 2016-03-07 MED ORDER — OXYCODONE HCL 5 MG PO TABS: 5.0000 mg | ORAL_TABLET | ORAL | Status: DC | PRN

## 2016-03-07 MED ORDER — VITAMIN B-12 1000 MCG PO TABS
500.0000 ug | ORAL_TABLET | Freq: Every day | ORAL | Status: DC
  Administered 2016-03-08 – 2016-03-09 (×2): 500 ug via ORAL
  Filled 2016-03-07 (×2): qty 1

## 2016-03-07 MED ORDER — MEMANTINE HCL ER 14 MG PO CP24
28.0000 mg | ORAL_CAPSULE | Freq: Every day | ORAL | Status: DC
  Administered 2016-03-07 – 2016-03-08 (×2): 28 mg via ORAL
  Filled 2016-03-07 (×2): qty 2

## 2016-03-07 MED ORDER — CRANBERRY 425 MG PO CAPS: 425.0000 mg | ORAL_CAPSULE | Freq: Every day | ORAL | Status: DC

## 2016-03-07 MED ORDER — ACETAMINOPHEN 325 MG PO TABS
650.0000 mg | ORAL_TABLET | Freq: Four times a day (QID) | ORAL | Status: DC | PRN
  Administered 2016-03-07: 650 mg via ORAL
  Filled 2016-03-07: qty 2

## 2016-03-07 MED ORDER — ADULT MULTIVITAMIN W/MINERALS CH
1.0000 | ORAL_TABLET | Freq: Two times a day (BID) | ORAL | Status: DC
  Administered 2016-03-08 – 2016-03-09 (×3): 1 via ORAL
  Filled 2016-03-07 (×3): qty 1

## 2016-03-07 MED ORDER — RISPERIDONE 0.5 MG PO TBDP: 0.5000 mg | ORAL_TABLET | Freq: Two times a day (BID) | ORAL | Status: DC | PRN

## 2016-03-07 MED ORDER — SODIUM CHLORIDE 0.9% FLUSH
3.0000 mL | Freq: Two times a day (BID) | INTRAVENOUS | Status: DC
  Administered 2016-03-07 – 2016-03-09 (×5): 3 mL via INTRAVENOUS

## 2016-03-07 MED ORDER — DONEPEZIL HCL 5 MG PO TABS
10.0000 mg | ORAL_TABLET | Freq: Every day | ORAL | Status: DC
  Administered 2016-03-07 – 2016-03-08 (×2): 10 mg via ORAL
  Filled 2016-03-07 (×2): qty 2

## 2016-03-07 MED ORDER — ATORVASTATIN CALCIUM 20 MG PO TABS
20.0000 mg | ORAL_TABLET | Freq: Every day | ORAL | Status: DC
  Administered 2016-03-08 – 2016-03-09 (×2): 20 mg via ORAL
  Filled 2016-03-07 (×2): qty 1

## 2016-03-07 MED ORDER — ACETAMINOPHEN 650 MG RE SUPP: 650.0000 mg | Freq: Four times a day (QID) | RECTAL | Status: DC | PRN

## 2016-03-07 MED ORDER — ASPIRIN EC 325 MG PO TBEC
325.0000 mg | DELAYED_RELEASE_TABLET | Freq: Every day | ORAL | Status: DC
  Administered 2016-03-08 – 2016-03-09 (×2): 325 mg via ORAL
  Filled 2016-03-07 (×2): qty 1

## 2016-03-07 MED ORDER — ENOXAPARIN SODIUM 40 MG/0.4ML ~~LOC~~ SOLN
40.0000 mg | SUBCUTANEOUS | Status: DC
  Administered 2016-03-07 – 2016-03-08 (×2): 40 mg via SUBCUTANEOUS
  Filled 2016-03-07 (×2): qty 0.4

## 2016-03-07 MED ORDER — ONDANSETRON HCL 4 MG PO TABS: 4.0000 mg | ORAL_TABLET | Freq: Four times a day (QID) | ORAL | Status: DC | PRN

## 2016-03-07 MED ORDER — MORPHINE SULFATE (PF) 2 MG/ML IV SOLN: 2.0000 mg | INTRAVENOUS | Status: DC | PRN

## 2016-03-07 MED ORDER — ONDANSETRON HCL 4 MG/2ML IJ SOLN: 4.0000 mg | Freq: Four times a day (QID) | INTRAMUSCULAR | Status: DC | PRN

## 2016-03-07 NOTE — Consult Note (Signed)
Referring Physician: Darl Householder    Chief Complaint: Lethargy, right facial droop  HPI: Savannah Young is an 82 y.o. female who is a SNF resident, recently admitted for TIA work up (02/22/2016) that was unremarkable, who this morning was at her baseline.  Was later found slumped over and poorly responsive.  EMS was called and the patient was brought in for further evaluation.  Initial NIHSS of 8.     At baseline it appears that the patient is pleasantly demented and ambulatory.    Date last known well: 03/07/2016 Time last known well: Time: 09:30 tPA Given: No: Improvement in symptoms  Past Medical History  Diagnosis Date  . Dementia   . Hypertension   . Diabetes mellitus ([place])   . COPD (chronic obstructive pulmonary disease) ([place])     History reviewed. No pertinent past surgical history.  Family history: Patient unable to provide due to mental status  Social History:  reports that she has never smoked. She does not have any smokeless tobacco history on file. She reports that she does not drink alcohol. Her drug history is not on file.  Allergies: No Known Allergies  Medications: I have reviewed the patient's current medications. Prior to Admission:  Prior to Admission medications   Medication Sig Start Date End Date Taking? Authorizing Provider  acetaminophen (TYLENOL) 500 MG tablet Take 500 mg by mouth 3 (three) times daily as needed for mild pain.   Yes Historical Provider, MD  albuterol (PROVENTIL HFA;VENTOLIN HFA) 108 (90 Base) MCG/ACT inhaler Inhale 2 puffs into the lungs every 4 (four) hours as needed for wheezing or shortness of breath.   Yes Historical Provider, MD  aspirin 325 MG EC tablet Take 1 tablet (325 mg total) by mouth daily. 02/24/16  Yes Theodoro Grist, MD  atorvastatin (LIPITOR) 20 MG tablet Take 1 tablet (20 mg total) by mouth daily. 02/24/16  Yes Theodoro Grist, MD  cholecalciferol (VITAMIN D) 1000 units tablet Take 2,000 Units by mouth daily.   Yes Historical Provider,  MD  Cranberry 425 MG CAPS Take 425 mg by mouth daily.   Yes Historical Provider, MD  cyanocobalamin 500 MCG tablet Take 500 mcg by mouth daily.   Yes Historical Provider, MD  diphenhydrAMINE (BENADRYL) 25 mg capsule Take 25 mg by mouth every 4 (four) hours as needed for itching.   Yes Historical Provider, MD  donepezil (ARICEPT) 10 MG tablet Take 10 mg by mouth at bedtime.   Yes Historical Provider, MD  guaifenesin (ROBITUSSIN) 100 MG/5ML syrup Take 200 mg by mouth 3 (three) times daily as needed for cough.   Yes Historical Provider, MD  LORazepam (ATIVAN) 0.5 MG tablet Take 0.5 mg by mouth at bedtime. Pt is also able to take twice daily as needed for anxiety.   Yes Historical Provider, MD  memantine (NAMENDA XR) 28 MG CP24 24 hr capsule Take 28 mg by mouth at bedtime.   Yes Historical Provider, MD  Multiple Vitamins-Minerals (ICAPS MV) TABS Take 1 tablet by mouth 2 (two) times daily with a meal.   Yes Historical Provider, MD  risperiDONE (RISPERDAL M-TABS) 0.5 MG disintegrating tablet Take 0.5 mg by mouth 2 (two) times daily as needed (for agitation).   Yes Historical Provider, MD    ROS: Unable to provide due to mental status  Physical Examination: Blood pressure 119/59, pulse 91, temperature 98.4 F (36.9 C), temperature source Oral, resp. rate 18, height 5\' 3"  (1.6 m), weight 51.256 kg (113 lb), last menstrual period 01/20/2016,  SpO2 100 %.  HEENT-  Normocephalic, no lesions, without obvious abnormality.  Normal external eye and conjunctiva.  Normal TM's bilaterally.  Normal auditory canals and external ears. Normal external nose, mucus membranes and septum.  Normal pharynx. Cardiovascular- S1, S2 normal, pulses palpable throughout   Lungs- chest clear, no wheezing, rales, normal symmetric air entry Abdomen- soft, non-tender; bowel sounds normal; no masses,  no organomegaly Extremities- no edema Lymph-no adenopathy palpable Musculoskeletal-no joint tenderness, deformity or  swelling Skin-warm and dry, no hyperpigmentation, vitiligo, or suspicious lesions  Neurological Examination Mental Status: Lethargic.  Will not respond to her age or the year but able to read sentences on cards and identify objects.  Speech fluent but dysarthric.  Able to follow commands without difficulty. Cranial Nerves: II: Discs flat bilaterally; Visual fields grossly normal, pupils pinpoint III,IV, VI: ptosis not present, extra-ocular motions intact bilaterally V,VII: right facial droop, facial light touch sensation normal bilaterally VIII: hearing normal bilaterally IX,X: gag reflex present XI: bilateral shoulder shrug XII: midline tongue extension Motor: Lift both arms above gravity but unable to maintain either leg off the bed initially.  With repeat examination able to lift both legs off the bed Sensory: Pinprick and light touch intact throughout, bilaterally Deep Tendon Reflexes: 3+ and symmetric throughout Plantars: Right: downgoing   Left: downgoing Cerebellar: Normal finger-to-nose and normal heel-to-shin testing bilaterally Gait: not tested due to safety concerns   Laboratory Studies:  Basic Metabolic Panel: No results for input(s): NA, K, CL, CO2, GLUCOSE, BUN, CREATININE, CALCIUM, MG, PHOS in the last 168 hours.  Liver Function Tests: No results for input(s): AST, ALT, ALKPHOS, BILITOT, PROT, ALBUMIN in the last 168 hours. No results for input(s): LIPASE, AMYLASE in the last 168 hours. No results for input(s): AMMONIA in the last 168 hours.  CBC:  Recent Labs Lab 03/07/16 1139  WBC 11.7*  NEUTROABS 10.3*  HGB 12.8  HCT 38.3  MCV 81.8  PLT 897*    Cardiac Enzymes: No results for input(s): CKTOTAL, CKMB, CKMBINDEX, TROPONINI in the last 168 hours.  BNP: Invalid input(s): POCBNP  CBG:  Recent Labs Lab 03/07/16 1204  GLUCAP 86*    Microbiology: Results for orders placed or performed during the hospital encounter of 02/22/16  MRSA PCR  Screening     Status: None   Collection Time: 02/23/16 12:01 AM  Result Value Ref Range Status   MRSA by PCR NEGATIVE NEGATIVE Final    Comment:        The GeneXpert MRSA Assay (FDA approved for NASAL specimens only), is one component of a comprehensive MRSA colonization surveillance program. It is not intended to diagnose MRSA infection nor to guide or monitor treatment for MRSA infections.     Coagulation Studies:  Recent Labs  03/07/16 1139  LABPROT 15.3*  INR 1.19    Urinalysis: No results for input(s): COLORURINE, LABSPEC, PHURINE, GLUCOSEU, HGBUR, BILIRUBINUR, KETONESUR, PROTEINUR, UROBILINOGEN, NITRITE, LEUKOCYTESUR in the last 168 hours.  Invalid input(s): APPERANCEUR  Lipid Panel: No results found for: CHOL, TRIG, HDL, CHOLHDL, VLDL, LDLCALC  HgbA1C: No results found for: HGBA1C  Urine Drug Screen:     Component Value Date/Time   LABOPIA NEGATIVE 09/20/2011 2056   [place] DETECTED 05/25/2011 0235   COCAINSCRNUR NEGATIVE 09/20/2011 2056   COCAINSCRNUR NONE DETECTED 05/25/2011 0235   LABBENZ NEGATIVE 09/20/2011 2056   LABBENZ NONE DETECTED 05/25/2011 0235   AMPHETMU NEGATIVE 09/20/2011 2056   AMPHETMU NONE DETECTED 05/25/2011 0235   THCU NEGATIVE 09/20/2011 2056  THCU NONE DETECTED 05/25/2011 0235   LABBARB NEGATIVE 09/20/2011 2056   LABBARB NONE DETECTED 05/25/2011 0235    Alcohol Level: No results for input(s): ETH in the last 168 hours.  Other results: EKG (02/22/2016): sinus arrhythmia at 81 bpm.  Imaging: Ct Head Code Stroke Wo Contrast  03/07/2016  CLINICAL DATA:  Right-sided facial droop EXAM: CT HEAD WITHOUT CONTRAST TECHNIQUE: Contiguous axial images were obtained from the base of the skull through the vertex without intravenous contrast. COMPARISON:  MRI 02/23/2016.  CT 02/23/2016. FINDINGS: The brain shows age related generalized atrophy. No evidence of old or acute small or large vessel infarction. No mass lesion, hemorrhage,  hydrocephalus or extra-axial collection. The calvarium is normal. Sinuses, middle ears and mastoids are clear. There is atherosclerotic calcification of the major vessels at the base of the brain. IMPRESSION: Age related atrophy.  No focal or acute finding. These results were called by telephone at the time of interpretation on 03/07/2016 at 12:03 pm to Dr. Shirlyn Goltz , who verbally acknowledged these results. Electronically Signed   By: Nelson Chimes M.D.   On: 03/07/2016 12:06    Assessment: 82 y.o. female presenting with lethargy and right facial droop.  This appears to be the same presentation she had with her last admission.   Head CT personally reviewed and shows no acute changes.  Patient was started on Risperdal and ASA with past admission.  At that time carotid dopplers showed no evidence of hemodynamically significant stenosis.  Echocardiogram showed no cardiac source of emboli with an EF of 55-60%.  MRI showed no acute changes.    Stroke Risk Factors - diabetes mellitus and hypertension  Plan: 1. HgbA1c, fasting lipid panel 2. MRI of the brain without contrast.  Would not repeat remainder of stroke work up at this time.   3. PT consult, OT consult, Speech consult 4. EEG 5. Prophylactic therapy-Continue ASA 6. NPO until RN stroke swallow screen 7. Telemetry monitoring 8. Frequent neuro checks 9. Rule out infection, wbc count elevated.    Case discussed with Dr. Dene Gentry, MD Neurology [phone] 03/07/2016, 12:17 PM

## 2016-03-07 NOTE — H&P (Signed)
[name] NAME: Savannah Young    MR#:  [id number]  DATE OF BIRTH:  09/16/1931   DATE OF ADMISSION:  03/07/2016  PRIMARY CARE PHYSICIAN: No primary care provider on file.   REQUESTING/REFERRING PHYSICIAN: Darl Householder  CHIEF COMPLAINT:   Chief Complaint  Patient presents with  . Altered Mental Status    HISTORY OF PRESENT ILLNESS:  Savannah Young  is a 82 y.o. female with a known history of Dementia baseline per documentation she is ambulatory but confused. The patient is unable to provide meaningful information given mental status medical condition She is presenting from her nursing facility after being found slumped over in her chair. She also believed to have new onset right-sided facial droop code stroke initiated she's been evaluated by neurology in emergency department. Once again patient unable to provide meaningful information given mental status medical condition PAST MEDICAL HISTORY:   Past Medical History  Diagnosis Date  . Dementia   . Hypertension   . Diabetes mellitus ([place])   . COPD (chronic obstructive pulmonary disease) ([place])     PAST SURGICAL HISTORY:  History reviewed. No pertinent past surgical history.  SOCIAL HISTORY:   Social History  Substance Use Topics  . Smoking status: Never Smoker   . Smokeless tobacco: Not on file  . Alcohol Use: No    FAMILY HISTORY:   Family History  Problem Relation Age of Onset  . Hypertension Other     DRUG ALLERGIES:  No Known Allergies  REVIEW OF SYSTEMS:  Unable to obtain given patient's mental status medical condition  MEDICATIONS AT HOME:   Prior to Admission medications   Medication Sig Start Date End Date Taking? Authorizing Provider  acetaminophen (TYLENOL) 500 MG tablet Take 500 mg by mouth 3 (three) times daily as needed for mild pain.   Yes Historical Provider, MD  albuterol (PROVENTIL HFA;VENTOLIN HFA) 108 (90 Base) MCG/ACT inhaler Inhale 2 puffs  into the lungs every 4 (four) hours as needed for wheezing or shortness of breath.   Yes Historical Provider, MD  aspirin 325 MG EC tablet Take 1 tablet (325 mg total) by mouth daily. 02/24/16  Yes Theodoro Grist, MD  atorvastatin (LIPITOR) 20 MG tablet Take 1 tablet (20 mg total) by mouth daily. 02/24/16  Yes Theodoro Grist, MD  cholecalciferol (VITAMIN D) 1000 units tablet Take 2,000 Units by mouth daily.   Yes Historical Provider, MD  Cranberry 425 MG CAPS Take 425 mg by mouth daily.   Yes Historical Provider, MD  cyanocobalamin 500 MCG tablet Take 500 mcg by mouth daily.   Yes Historical Provider, MD  diphenhydrAMINE (BENADRYL) 25 mg capsule Take 25 mg by mouth every 4 (four) hours as needed for itching.   Yes Historical Provider, MD  donepezil (ARICEPT) 10 MG tablet Take 10 mg by mouth at bedtime.   Yes Historical Provider, MD  guaifenesin (ROBITUSSIN) 100 MG/5ML syrup Take 200 mg by mouth 3 (three) times daily as needed for cough.   Yes Historical Provider, MD  LORazepam (ATIVAN) 0.5 MG tablet Take 0.5 mg by mouth at bedtime. Pt is also able to take twice daily as needed for anxiety.   Yes Historical Provider, MD  memantine (NAMENDA XR) 28 MG CP24 24 hr capsule Take 28 mg by mouth at bedtime.   Yes Historical Provider, MD  Multiple Vitamins-Minerals (ICAPS MV) TABS Take 1 tablet by mouth 2 (two) times daily with a meal.   Yes Historical  Provider, MD  risperiDONE (RISPERDAL M-TABS) 0.5 MG disintegrating tablet Take 0.5 mg by mouth 2 (two) times daily as needed (for agitation).   Yes Historical Provider, MD      VITAL SIGNS:  Blood pressure 103/56, pulse 91, temperature 98.4 F (36.9 C), temperature source Oral, resp. rate 19, height 5\' 3"  (1.6 m), weight 113 lb (51.256 kg), last menstrual period 01/20/2016, SpO2 100 %.  PHYSICAL EXAMINATION:   VITAL SIGNS: Filed Vitals:   03/07/16 1147 03/07/16 1321  BP: 119/59 103/56  Pulse: 91   Temp: 98.4 F (36.9 C)   Resp: 18 55   GENERAL:82  y.o.female moderate distress given mental status.  HEAD: Normocephalic, atraumatic.  EYES: Pupils equal, round, reactive to light. Unable to assess extraocular muscles given mental status/medical condition. No scleral icterus.  MOUTH: Moist mucosal membrane. Dentition intact. No abscess noted.  EAR, NOSE, THROAT: Clear without exudates. No external lesions.  NECK: Supple. No thyromegaly. No nodules. No JVD.  PULMONARY: Clear to ascultation, without wheeze rails or rhonci. No use of accessory muscles, Good respiratory effort. good air entry bilaterally CHEST: Nontender to palpation.  CARDIOVASCULAR: S1 and S2. Regular rate and rhythm. No murmurs, rubs, or gallops. No edema. Pedal pulses 2+ bilaterally.  GASTROINTESTINAL: Soft, nontender, nondistended. No masses. Positive bowel sounds. No hepatosplenomegaly.  MUSCULOSKELETAL: No swelling, clubbing, or edema. Range of motion full in all extremities.  NEUROLOGIC: Unable to assess given mental status/medical condition SKIN: No ulceration, lesions, rashes, or cyanosis. Skin warm and dry. Turgor intact.  PSYCHIATRIC: Unable to assess given mental status/medical condition      LABORATORY PANEL:   CBC  Recent Labs Lab 03/07/16 1139  WBC 11.7*  HGB 12.8  HCT 38.3  PLT 897*   ------------------------------------------------------------------------------------------------------------------  Chemistries   Recent Labs Lab 03/07/16 1139  NA 143  K 3.8  CL 112*  CO2 21*  GLUCOSE 247*  BUN 23*  CREATININE 1.09*  CALCIUM 8.5*  AST 31  ALT 16  ALKPHOS 77  BILITOT 0.4   ------------------------------------------------------------------------------------------------------------------  Cardiac Enzymes  Recent Labs Lab 03/07/16 1139  TROPONINI <0.03   ------------------------------------------------------------------------------------------------------------------  RADIOLOGY:  Dg Chest 2 View  03/07/2016  CLINICAL DATA:   Altered mental status. History of hypertension and COPD. EXAM: CHEST  2 VIEW COMPARISON:  02/22/2016. FINDINGS: Normal sized heart. The lungs remain clear with stable prominence of the interstitial markings. Diffuse osteopenia. Atheromatous calcifications in the aortic arch. IMPRESSION: 1. No acute abnormality. 2. Stable chronic interstitial lung disease. 3. Aortic atherosclerosis. Electronically Signed   By: Claudie Revering M.D.   On: 03/07/2016 12:57   Ct Head Code Stroke Wo Contrast  03/07/2016  CLINICAL DATA:  Right-sided facial droop EXAM: CT HEAD WITHOUT CONTRAST TECHNIQUE: Contiguous axial images were obtained from the base of the skull through the vertex without intravenous contrast. COMPARISON:  MRI 02/23/2016.  CT 02/23/2016. FINDINGS: The brain shows age related generalized atrophy. No evidence of old or acute small or large vessel infarction. No mass lesion, hemorrhage, hydrocephalus or extra-axial collection. The calvarium is normal. Sinuses, middle ears and mastoids are clear. There is atherosclerotic calcification of the major vessels at the base of the brain. IMPRESSION: Age related atrophy.  No focal or acute finding. These results were called by telephone at the time of interpretation on 03/07/2016 at 12:03 pm to Dr. Shirlyn Goltz , who verbally acknowledged these results. Electronically Signed   By: Nelson Chimes M.D.   On: 03/07/2016 12:06    EKG:  Orders placed or performed during the hospital encounter of 03/07/16  . EKG 12-Lead  . EKG 12-Lead  . ED EKG  . ED EKG    IMPRESSION AND PLAN:   82 year old African American female history of dementia presenting with worsening mental status  1. Encephalopathy: Appreciate neurology recommendations, continue with neuro checks, check EEG, will decrease the amount of sedative agents she is taking including Ativan 2.Hypokalemia: Replace potassium goal 4-5 3. Dementia: Continue with Aricept, Namenda 4. Hyperlipidemia unspecified  Lipitor     All the records are reviewed and case discussed with ED provider. Management plans discussed with the patient, family and they are in agreement.  CODE STATUS: Full  TOTAL TIME TAKING CARE OF THIS PATIENT: 33 minutes.    Hower,  Karenann Cai.D on 03/07/2016 at 2:28 PM  Between 7am to 6pm - Pager - [phone]  After 6pm: House Pager: - [facility]  Office  [phone]  CC: Primary care physician; No primary care provider on file.

## 2016-03-07 NOTE — ED Provider Notes (Signed)
CSN: [id number]     Arrival date & time 03/07/16  1134 History   First MD Initiated Contact with Patient 03/07/16 1137     Chief Complaint  Patient presents with  . Altered Mental Status    An emergency department physician performed an initial assessment on this suspected stroke patient at 1145. (Consider location/radiation/quality/duration/timing/severity/associated sxs/prior Treatment) The history is provided by the patient and the EMS personnel.  Savannah Young is a 82 y.o. female hx of dementia, HTN, DM, COPD Who presenting with altered mental status, possible right facial droop. Patient resides at a nursing home. She was noted to have new onset right Peter droop around 9:30 AM this morning. She then slumped over and became unresponsive at breakfast. Patient was recently admitted for left facial droop and was diagnosed with possible TIA. She had CT head and MRI brain that was unremarkable.     Level V caveat- AMS    Past Medical History  Diagnosis Date  . Dementia   . Hypertension   . Diabetes mellitus ([place])   . COPD (chronic obstructive pulmonary disease) ([place])    History reviewed. No pertinent past surgical history. History reviewed. No pertinent family history. Social History  Substance Use Topics  . Smoking status: Never Smoker   . Smokeless tobacco: None  . Alcohol Use: No   OB History    No data available     Review of Systems  Psychiatric/Behavioral: Positive for confusion.  All other systems reviewed and are negative.     Allergies  Review of patient's allergies indicates no known allergies.  Home Medications   Prior to Admission medications   Medication Sig Start Date End Date Taking? Authorizing Provider  acetaminophen (TYLENOL) 500 MG tablet Take 500 mg by mouth 3 (three) times daily as needed for mild pain.   Yes Historical Provider, MD  albuterol (PROVENTIL HFA;VENTOLIN HFA) 108 (90 Base) MCG/ACT inhaler Inhale 2 puffs into the lungs every 4  (four) hours as needed for wheezing or shortness of breath.   Yes Historical Provider, MD  aspirin 325 MG EC tablet Take 1 tablet (325 mg total) by mouth daily. 02/24/16  Yes Theodoro Grist, MD  atorvastatin (LIPITOR) 20 MG tablet Take 1 tablet (20 mg total) by mouth daily. 02/24/16  Yes Theodoro Grist, MD  cholecalciferol (VITAMIN D) 1000 units tablet Take 2,000 Units by mouth daily.   Yes Historical Provider, MD  Cranberry 425 MG CAPS Take 425 mg by mouth daily.   Yes Historical Provider, MD  cyanocobalamin 500 MCG tablet Take 500 mcg by mouth daily.   Yes Historical Provider, MD  diphenhydrAMINE (BENADRYL) 25 mg capsule Take 25 mg by mouth every 4 (four) hours as needed for itching.   Yes Historical Provider, MD  donepezil (ARICEPT) 10 MG tablet Take 10 mg by mouth at bedtime.   Yes Historical Provider, MD  guaifenesin (ROBITUSSIN) 100 MG/5ML syrup Take 200 mg by mouth 3 (three) times daily as needed for cough.   Yes Historical Provider, MD  LORazepam (ATIVAN) 0.5 MG tablet Take 0.5 mg by mouth at bedtime. Pt is also able to take twice daily as needed for anxiety.   Yes Historical Provider, MD  memantine (NAMENDA XR) 28 MG CP24 24 hr capsule Take 28 mg by mouth at bedtime.   Yes Historical Provider, MD  Multiple Vitamins-Minerals (ICAPS MV) TABS Take 1 tablet by mouth 2 (two) times daily with a meal.   Yes Historical Provider, MD  risperiDONE (RISPERDAL M-TABS)  0.5 MG disintegrating tablet Take 0.5 mg by mouth 2 (two) times daily as needed (for agitation).   Yes Historical Provider, MD   BP 103/56 mmHg  Pulse 91  Temp(Src) 98.4 F (36.9 C) (Oral)  Resp 19  Ht 5\' 3"  (1.6 m)  Wt 113 lb (51.256 kg)  BMI 20.02 kg/m2  SpO2 100%  LMP 01/20/2016 (Approximate) Physical Exam  Constitutional:  Chronically ill   HENT:  Head: Normocephalic.  Mouth/Throat: Oropharynx is clear and moist.  Eyes: Conjunctivae are normal. Pupils are equal, round, and reactive to light.  Neck: Normal range of motion.  Neck supple.  Cardiovascular: Normal rate, regular rhythm and normal heart sounds.   Pulmonary/Chest: Effort normal and breath sounds normal. No respiratory distress. She has no wheezes. She has no rales.  Abdominal: Soft. Bowel sounds are normal. She exhibits no distension. There is no tenderness. There is no rebound.  Musculoskeletal: Normal range of motion. She exhibits no edema or tenderness.  Neurological:  + R facial droop. Difficulty following commands but moving all extremities.   Skin: Skin is warm and dry.  Psychiatric:  Unable   Nursing note and vitals reviewed.   ED Course  Procedures (including critical care time) Labs Review Labs Reviewed  CBC WITH DIFFERENTIAL/PLATELET - Abnormal; Notable for the following:    WBC 11.7 (*)    RDW 15.7 (*)    Platelets 897 (*)    Neutro Abs 10.3 (*)    Lymphs Abs 0.7 (*)    All other components within normal limits  COMPREHENSIVE METABOLIC PANEL - Abnormal; Notable for the following:    Chloride 112 (*)    CO2 21 (*)    Glucose, Bld 247 (*)    BUN 23 (*)    Creatinine, Ser 1.09 (*)    Calcium 8.5 (*)    Total Protein 6.1 (*)    GFR calc non Af Amer 46 (*)    GFR calc Af Amer 53 (*)    All other components within normal limits  PROTIME-INR - Abnormal; Notable for the following:    Prothrombin Time 15.3 (*)    All other components within normal limits  GLUCOSE, CAPILLARY - Abnormal; Notable for the following:    Glucose-Capillary 262 (*)    All other components within normal limits  TROPONIN I  ETHANOL  APTT  URINALYSIS COMPLETEWITH MICROSCOPIC ([facility] ONLY)  URINE DRUG SCREEN, QUALITATIVE ([facility] ONLY)    Imaging Review Dg Chest 2 View  03/07/2016  CLINICAL DATA:  Altered mental status. History of hypertension and COPD. EXAM: CHEST  2 VIEW COMPARISON:  02/22/2016. FINDINGS: Normal sized heart. The lungs remain clear with stable prominence of the interstitial markings. Diffuse osteopenia. Atheromatous calcifications in the  aortic arch. IMPRESSION: 1. No acute abnormality. 2. Stable chronic interstitial lung disease. 3. Aortic atherosclerosis. Electronically Signed   By: Claudie Revering M.D.   On: 03/07/2016 12:57   Ct Head Code Stroke Wo Contrast  03/07/2016  CLINICAL DATA:  Right-sided facial droop EXAM: CT HEAD WITHOUT CONTRAST TECHNIQUE: Contiguous axial images were obtained from the base of the skull through the vertex without intravenous contrast. COMPARISON:  MRI 02/23/2016.  CT 02/23/2016. FINDINGS: The brain shows age related generalized atrophy. No evidence of old or acute small or large vessel infarction. No mass lesion, hemorrhage, hydrocephalus or extra-axial collection. The calvarium is normal. Sinuses, middle ears and mastoids are clear. There is atherosclerotic calcification of the major vessels at the base of the brain. IMPRESSION: Age  related atrophy.  No focal or acute finding. These results were called by telephone at the time of interpretation on 03/07/2016 at 12:03 pm to Dr. Shirlyn Goltz , who verbally acknowledged these results. Electronically Signed   By: Nelson Chimes M.D.   On: 03/07/2016 12:06   I have personally reviewed and evaluated these images and lab results as part of my medical decision-making.   EKG Interpretation None       ED ECG REPORT I, YAO, DAVID, the attending physician, personally viewed and interpreted this ECG.   Date: 03/07/2016  EKG Time: 11: 48 am   Rate: 83  Rhythm: normal EKG, normal sinus rhythm  Axis: normal  Intervals:none  ST&T Change: normal   MDM   Final diagnoses:  Facial droop    Tameika A Barbati is a 82 y.o. female here with AMS, R facial droop. Code stroke activated. Dr. Doy Mince at bedside. Doesn't want to give TPA given improving symptoms. Poor mental status overall. Will get labs, CT head.   2:26 PM CT head unremarkable. Dr. Doy Mince recommend EEG and observation. Labs at baseline. Hospitalist to admit.     Wandra Arthurs, MD 03/07/16 [phone]

## 2016-03-07 NOTE — NC FL2 (Signed)
[place] LEVEL OF CARE SCREENING TOOL     IDENTIFICATION  Patient Name: Savannah Young Birthdate: 04/29/1932 Sex: female Admission Date (Current Location): 03/07/2016  [place] and [place] Number:  Engineering geologist and Address:  [facility], [address] [place],  [address]      Provider Number: [id number]  Attending Physician Name and Address:  Lytle Butte, MD  Relative Name and Phone Number:       Current Level of Care: Hospital Recommended Level of Care: Memory Care Prior Approval Number:    Date Approved/Denied:   PASRR Number: [id number]  Discharge Plan: SNF    Current Diagnoses: Patient Active Problem List   Diagnosis Date Noted  . Encephalopathy acute 03/07/2016  . Pressure ulcer 02/23/2016  . Confusion 02/22/2016  . Agitation 01/20/2016    Orientation RESPIRATION BLADDER Height & Weight     Self  Normal Continent Weight: 113 lb (51.256 kg) Height:  5\' 3"  (160 cm)  BEHAVIORAL SYMPTOMS/MOOD NEUROLOGICAL BOWEL NUTRITION STATUS   (None)  (None) Continent Diet (Heart)  AMBULATORY STATUS COMMUNICATION OF NEEDS Skin   Limited Assist Verbally Normal                       Personal Care Assistance Level of Assistance  Bathing, Feeding, Dressing Bathing Assistance: Limited assistance Feeding assistance: Independent Dressing Assistance: Limited assistance     Functional Limitations Info  Sight, Hearing, Speech Sight Info: Adequate Hearing Info: Adequate Speech Info: Adequate    SPECIAL CARE FACTORS FREQUENCY  PT (By licensed PT)     PT Frequency: 5              Contractures      Additional Factors Info  Code Status, Allergies Code Status Info: Full Code Allergies Info: No known allergies            Current Medications (03/07/2016):  This is the current hospital active medication list Current Facility-Administered Medications  Medication Dose Route Frequency Provider Last Rate  Last Dose  . acetaminophen (TYLENOL) tablet 650 mg  650 mg Oral Q6H PRN Lytle Butte, MD       Or  . acetaminophen (TYLENOL) suppository 650 mg  650 mg Rectal Q6H PRN Lytle Butte, MD      . enoxaparin (LOVENOX) injection 40 mg  40 mg Subcutaneous Q24H Lytle Butte, MD      . morphine 2 MG/ML injection 2 mg  2 mg Intravenous Q4H PRN Lytle Butte, MD      . ondansetron [facility]) tablet 4 mg  4 mg Oral Q6H PRN Lytle Butte, MD       Or  . ondansetron [facility]) injection 4 mg  4 mg Intravenous Q6H PRN Lytle Butte, MD      . oxyCODONE (Oxy IR/ROXICODONE) immediate release tablet 5 mg  5 mg Oral Q4H PRN Lytle Butte, MD      . sodium chloride flush (NS) 0.9 % injection 3 mL  3 mL Intravenous Q12H Lytle Butte, MD       Current Outpatient Prescriptions  Medication Sig Dispense Refill  . acetaminophen (TYLENOL) 500 MG tablet Take 500 mg by mouth 3 (three) times daily as needed for mild pain.    Marland Kitchen albuterol (PROVENTIL HFA;VENTOLIN HFA) 108 (90 Base) MCG/ACT inhaler Inhale 2 puffs into the lungs every 4 (four) hours as needed for wheezing or shortness of breath.    Marland Kitchen  aspirin 325 MG EC tablet Take 1 tablet (325 mg total) by mouth daily. 30 tablet 0  . atorvastatin (LIPITOR) 20 MG tablet Take 1 tablet (20 mg total) by mouth daily. 30 tablet 0  . cholecalciferol (VITAMIN D) 1000 units tablet Take 2,000 Units by mouth daily.    . Cranberry 425 MG CAPS Take 425 mg by mouth daily.    . cyanocobalamin 500 MCG tablet Take 500 mcg by mouth daily.    . diphenhydrAMINE (BENADRYL) 25 mg capsule Take 25 mg by mouth every 4 (four) hours as needed for itching.    . donepezil (ARICEPT) 10 MG tablet Take 10 mg by mouth at bedtime.    Marland Kitchen guaifenesin (ROBITUSSIN) 100 MG/5ML syrup Take 200 mg by mouth 3 (three) times daily as needed for cough.    Marland Kitchen LORazepam (ATIVAN) 0.5 MG tablet Take 0.5 mg by mouth at bedtime. Pt is also able to take twice daily as needed for anxiety.    . memantine (NAMENDA XR) 28 MG CP24 24  hr capsule Take 28 mg by mouth at bedtime.    . Multiple Vitamins-Minerals (ICAPS MV) TABS Take 1 tablet by mouth 2 (two) times daily with a meal.    . risperiDONE (RISPERDAL M-TABS) 0.5 MG disintegrating tablet Take 0.5 mg by mouth 2 (two) times daily as needed (for agitation).       Discharge Medications: Please see discharge summary for a list of discharge medications.  Relevant Imaging Results:  Relevant Lab Results:   Additional Information SSN [id number]  Georga Kaufmann, LCSWA

## 2016-03-07 NOTE — ED Notes (Signed)
Code  Stroke  Called  To 333 

## 2016-03-07 NOTE — Clinical Social Work Note (Signed)
Clinical Social Work Assessment  Patient Details  Name: Savannah Young MRN: [id number] Date of Birth: Feb 08, 1932  Date of referral:  03/07/16               Reason for consult:  Facility Placement                Permission sought to share information with:  Facility Art therapist granted to share information::     Name::        Agency::     Relationship::     Contact Information:     Housing/Transportation Living arrangements for the past 2 months:  [place] of Information:  Facility Patient Interpreter Needed:  None Criminal Activity/Legal Involvement Pertinent to Current Situation/Hospitalization:  No - Comment as needed Significant Relationships:  Siblings, [employer] Lives with:  Facility Resident Do you feel safe going back to the place where you live?  Yes Need for family participation in patient care:  Yes (Comment)  Care giving concerns: Pt currently unable to ambulate on her own.   Social Worker assessment / plan: CSW spoke with Savannah Young from [employer] via telephone [phone]. CSW introduced herself and her role as a Education officer, museum. CSW informed Savannah Young that it is unclear if pt will be admitted at this time. Savannah Young stated that at baseline pt is able to walk, talk, and feed herself independently. Savannah Young states that pt seemed to be doing fine this morning but then began "acting funny". Pt has a diagnosis of dementia and is in the Memory Care unit of ALF. Savannah Young states that pt has siblings that visit her frequently and are supportive. [facility] is willing to accept pt back into the facility unless pt is recommended for higher level of care. CSW will continue to follow pt and keep facility updated about pt's status.  Employment status:  Retired Forensic scientist:  [employer] PT Recommendations:  [place] / Referral to community resources:  [place]  Patient/Family's Response to care:  Pt currently lives in [facility].  Patient/Family's Understanding of and Emotional Response to Diagnosis, Current Treatment, and Prognosis: Pt is appreciative of the care that she has received at this time.  Emotional Assessment Appearance:  Appears stated age Attitude/Demeanor/Rapport:  Unable to Assess Affect (typically observed):  Unable to Assess Orientation:  Fluctuating Orientation (Suspected and/or reported [place]) Alcohol / Substance use:  Never Used Psych involvement (Current and /or in the community):  No (Comment)  Discharge Needs  Concerns to be addressed:  Care Coordination Readmission within the last 30 days:  Yes Current discharge risk:  None Barriers to Discharge:  No Barriers Identified   Savannah Young, [place] 03/07/2016, 2:06 PM

## 2016-03-07 NOTE — Progress Notes (Addendum)
Contacted Dr Lavetta Nielsen to verify stroke code. Per Dr Lavetta Nielsen no stroke protocol ordered.

## 2016-03-07 NOTE — ED Notes (Signed)
Pt to ED via [facility] from [facility] c/o altered mental status. Per EMS pt was found slumped over and unresponsive after breakfast today. Pt has hx of recent TIA.

## 2016-03-07 NOTE — ED Notes (Signed)
Spoke with staff at [facility], RN stated pt recently treated for TIA and she "thinks the left side was affected" and that it had "almost resolved" and that the "right sided mouth droop is new" and that pt is usually alert and communicates verbally. Staff stated pt was last seen normal approximately "0930" today.

## 2016-03-07 NOTE — Progress Notes (Signed)
Pt addmitted from the ED. Assigned to pt from 1700-1900. Pt alert to self. Follow commands. NIH not done. Per report from [place], RN and progress note not a stroke protocol. VSS. Neuro checks and VS qx2hrs continues. No signs of pain.

## 2016-03-07 NOTE — Progress Notes (Signed)
PHARMACIST - PHYSICIAN ORDER COMMUNICATION  CONCERNING: P&T Medication Policy on Herbal Medications  DESCRIPTION:  This patient's order for:  Cranberry Capsules  has been noted.  This product(s) is classified as an "herbal" or natural product. Due to a lack of definitive safety studies or FDA approval, nonstandard manufacturing practices, plus the potential risk of unknown drug-drug interactions while on inpatient medications, the Pharmacy and Therapeutics Committee does not permit the use of "herbal" or natural products of this type within [facility].   ACTION TAKEN: The pharmacy department is unable to verify this order at this time and your patient has been informed of this safety policy. Please reevaluate patient's clinical condition at discharge and address if the herbal or natural product(s) should be resumed at that time.

## 2016-03-08 ENCOUNTER — Observation Stay: Payer: Medicare Other

## 2016-03-08 DIAGNOSIS — G459 Transient cerebral ischemic attack, unspecified: Secondary | ICD-10-CM | POA: Diagnosis not present

## 2016-03-08 LAB — GLUCOSE, CAPILLARY
GLUCOSE-CAPILLARY: 75 mg/dL (ref 65–99)
GLUCOSE-CAPILLARY: 137 mg/dL — AB (ref 65–99)
GLUCOSE-CAPILLARY: 94 mg/dL (ref 65–99)

## 2016-03-08 LAB — HEMOGLOBIN A1C: HEMOGLOBIN A1C: 5.3 % (ref 4.0–6.0)

## 2016-03-08 MED ORDER — SODIUM CHLORIDE 0.9 % IV SOLN
INTRAVENOUS | Status: DC
Start: 2016-03-08 — End: 2016-03-09
  Administered 2016-03-08 – 2016-03-09 (×3): via INTRAVENOUS

## 2016-03-08 MED ORDER — ENSURE ENLIVE PO LIQD
237.0000 mL | Freq: Two times a day (BID) | ORAL | Status: DC
  Administered 2016-03-08 – 2016-03-09 (×2): 237 mL via ORAL

## 2016-03-08 MED ORDER — INSULIN ASPART 100 UNIT/ML ~~LOC~~ SOLN
0.0000 [IU] | Freq: Three times a day (TID) | SUBCUTANEOUS | Status: DC
  Administered 2016-03-08 – 2016-03-09 (×2): 1 [IU] via SUBCUTANEOUS
  Filled 2016-03-08 (×2): qty 1

## 2016-03-08 NOTE — Progress Notes (Addendum)
Texted a massage via Amnion to Dr Posey Pronto that pt last voided yesterday 03/07/16 at 2000 per documentation. Pt is still dry. Bladder scan results 148ml. An order for IVF placed by MD.

## 2016-03-08 NOTE — Plan of Care (Signed)
Problem: Physical Regulation: Goal: Ability to maintain clinical measurements within normal limits will improve Outcome: Progressing Pt alert to self. Pleasantly confused. Follow simple commands. Up to the chair today and walked with PT to the nursing station with a walker and minimal assistance. No urine output during the shift and last output documented yesterday at 2000. Bladder scan shows 122ml at 1431. MD notified, IVF initiated.  Problem: Nutrition: Goal: Adequate nutrition will be maintained Outcome: Progressing Pt eat 100% of meals. Needs assistance. Fluids encouraged. Aspiration precautions maintained.

## 2016-03-08 NOTE — Evaluation (Signed)
Clinical/Bedside Swallow Evaluation Patient Details  Name: Savannah Young MRN: [id number] Date of Birth: Aug 25, 1932  Today's Date: 03/08/2016 Time: SLP Start Time (ACUTE ONLY): 0745 SLP Stop Time (ACUTE ONLY): 0845 SLP Time Calculation (min) (ACUTE ONLY): 60 min  Past Medical History:  Past Medical History  Diagnosis Date  . Dementia   . Hypertension   . Diabetes mellitus ([place])   . COPD (chronic obstructive pulmonary disease) ([place])    Past Surgical History: History reviewed. No pertinent past surgical history. HPI:  Pt is a 82 y.o. female with a known history of Dementia baseline, HTN, COPD, DM per documentation she is ambulatory but confused. The patient is unable to provide meaningful information given mental status medical condition She is presenting from her nursing facility after being found slumped over in her chair. She also believed to have new onset right-sided facial droop. Neurology consulted and assessed pt stating pt was presenting with lethargy and right facial droop.This appeared to be the same presentation she had with her last admission.Head CT personally reviewed and shows no acute changes. No further stroke workup performed per NSG report. Pt is still sleepy but awakened and gave a few verbal responses to SLP. She followed 2-3 basic commands and identified 2-3 food/drink preferences.     Assessment / Plan / Recommendation Clinical Impression  Pt appeared to adequately tolerate trials of thin liquids and purees w/ no overt s/s of aspiration noted; no significant oropharyngeal phase deficits noted except for min. slower A-P transfer/bolus management w/ increased tetured trials of puree. GIven min. extra time, pt adequately cleared po trials. Noted a slight amount of bolus residue (w/ ice cream) at Right corner of mouth, however, pt was leaning slightly to her Right on her arm. Pt's Cognitive status hampered her attention to task and follow through w/ instructions at times.  She responded well to verbal direction/cues. Pt appears at reduced risk for aspiration following general aspiration precautions. Recommend a Dysphagia 1(puree) diet w/ thin liquids w/ aspiration precautions d/t declined Cognitive status and d/t lacking dentition; medications in Puree - Crushed as able. Feeding assistance at all meals; foods of preference and use of condiments to flavor well.      Aspiration Risk   (reduced following aspiration precautions)    Diet Recommendation  Dysphagia 1(puree) w/ thin liquids; use of condiments to flavor well; general aspiration precautions. Feeding assistance at all meals.     Medication Administration: Crushed with puree for easier swallowing.   Other  Recommendations Recommended Consults:  (Dietician f/u for nutritional supplements - drink form best) Oral Care Recommendations: Oral care BID;Staff/trained caregiver to provide oral care   Follow up Recommendations  None    Frequency and Duration min 2x/week  1 week while admitted       Prognosis Prognosis for Safe Diet Advancement: Fair Barriers to Reach Goals: Cognitive deficits      Swallow Study   General Date of Onset: 03/07/16 HPI: Pt is a 82 y.o. female with a known history of Dementia baseline, HTN, COPD, DM per documentation she is ambulatory but confused. The patient is unable to provide meaningful information given mental status medical condition She is presenting from her nursing facility after being found slumped over in her chair. She also believed to have new onset right-sided facial droop. Neurology consulted and assessed pt stating pt was presenting with lethargy and right facial droop.This appeared to be the same presentation she had with her last admission.Head CT personally reviewed and shows  no acute changes. No further stroke workup performed per NSG report. Pt is still sleepy but awakened and gave a few verbal responses to SLP. She followed 2-3 basic commands and identified 2-3  food/drink preferences.   Type of Study: Bedside Swallow Evaluation Previous Swallow Assessment: none indicated Diet Prior to this Study:  (unknown) Temperature Spikes Noted: No (wbc min. elevated) Respiratory Status: Room air History of Recent Intubation: No Behavior/Cognition: Alert;Cooperative;Pleasant mood;Confused;Requires cueing Oral Cavity Assessment: Dry Oral Care Completed by SLP: Yes Oral Cavity - Dentition: Edentulous Vision:  (n/a) Self-Feeding Abilities: Needs assist;Needs set up;Total assist Patient Positioning: Upright in bed Baseline Vocal Quality: Low vocal intensity Volitional Cough: Cognitively unable to elicit Volitional Swallow: Able to elicit    Oral/Motor/Sensory Function Overall Oral Motor/Sensory Function: Mild impairment Facial ROM: Reduced right (slight) Facial Symmetry: Abnormal symmetry right (slight) Facial Strength: Reduced right (slight) Lingual ROM: Within Functional Limits Lingual Symmetry: Within Functional Limits Lingual Strength: Within Functional Limits Velum:  (NT) Mandible:  (NT)   Ice Chips Ice chips: Within functional limits Presentation: Spoon (fed; 3 trials)   Thin Liquid Thin Liquid: Within functional limits Presentation: Straw (fed; 10 trials)    Nectar Thick Nectar Thick Liquid: Not tested   Honey Thick Honey Thick Liquid: Not tested   Puree Puree: Impaired Presentation: Spoon (fed; 8 trials) Oral Phase Functional Implications: Prolonged oral transit (min. w/ purees) Pharyngeal Phase Impairments:  (none)   Solid   GO   Solid: Not tested    Functional Assessment Tool Used: clinical judgement Functional Limitations: Swallowing Swallow Current Status [id number]): At least 1 percent but less than 20 percent impaired, limited or restricted Swallow Goal Status [phone]): At least 1 percent but less than 20 percent impaired, limited or restricted Swallow Discharge Status [phone]): At least 1 percent but less than 20 percent impaired,  limited or restricted     Orinda Kenner, MS, CCC-SLP  Watson,Katherine 03/08/2016,9:40 AM

## 2016-03-08 NOTE — Plan of Care (Signed)
Problem: Nutrition: Goal: Adequate nutrition will be maintained Outcome: Not Progressing NPO for swallowing study

## 2016-03-08 NOTE — Progress Notes (Signed)
Inpatient Diabetes Program Recommendations  AACE/ADA: New Consensus Statement on Inpatient Glycemic Control (2015)  Target Ranges:  Prepandial:   less than 140 mg/dL      Peak postprandial:   less than 180 mg/dL (1-2 hours)      Critically ill patients:  140 - 180 mg/dL   Lab Results  Component Value Date   GLUCAP 75 03/08/2016   HGBA1C 5.3 03/07/2016    Review of Glycemic Control  Results for LOISTEEN, MUKHERJEE (MRN [id number]) as of 03/08/2016 09:19  Ref. Range 03/07/2016 12:04 03/08/2016 07:51  Glucose-Capillary Latest Ref Range: 65-99 mg/dL 262 (H) 75    Diabetes history: Type 2 Outpatient Diabetes medications: none Current orders for Inpatient glycemic control: none  Inpatient Diabetes Program Recommendations:  Monitor CBG daily- agree with no diabetes meds at this time.  Gentry Fitz, RN, BA, MHA, CDE Diabetes Coordinator Inpatient Diabetes Program  [phone] (Team Pager) [phone] ([place]) 03/08/2016 9:22 AM

## 2016-03-08 NOTE — Progress Notes (Signed)
[place] at [facility]                                                                                                                                                                                            Patient Demographics   Savannah Young, is a 82 y.o. female, DOB - [birth year], [id number]  Admit date - 03/07/2016   Admitting Physician Lytle Butte, MD  Outpatient Primary MD for the patient is No primary care provider on file.   LOS -   Subjective:Patient admitted with recurrent episode of decrease in responsiveness similar to her recent admission at that time had a workup for stroke which was negative. She was diagnosed as TIA she is readmitted with same. Patient has dementia and unable to provide any history. At my time of evaluation she is sleepy.     Review of Systems:   Patient unable to provide any review of systems  Vitals:   Filed Vitals:   03/07/16 2226 03/07/16 2357 03/07/16 2357 03/08/16 0440  BP: 116/93 92/42 88/43  110/61  Pulse: 83 59 63 78  Temp: 98 F (36.7 C) 97.8 F (36.6 C)  97.7 F (36.5 C)  TempSrc: Oral Oral  Oral  Resp:  18  16  Height:      Weight:      SpO2: 99% 98% 100% 97%    Wt Readings from Last 3 Encounters:  03/07/16 47.809 kg (105 lb 6.4 oz)  02/24/16 51.313 kg (113 lb 2 oz)  01/21/16 51.5 kg (113 lb 8.6 oz)     Intake/Output Summary (Last 24 hours) at 03/08/16 1230 Last data filed at 03/08/16 0900  Gross per 24 hour  Intake    290 ml  Output      0 ml  Net    290 ml    Physical Exam:   GENERAL: Pleasant-appearing in no apparent distress.  HEAD, EYES, EARS, NOSE AND THROAT: Atraumatic, normocephalic. are intact. Pupils equal and reactive to light. Sclerae anicteric. No conjunctival injection. No oro-pharyngeal erythema.  NECK: Supple. There is no jugular venous distention. No bruits, no lymphadenopathy, no thyromegaly.  HEART: Regular rate and rhythm,. No murmurs, no rubs, no  clicks.  LUNGS: Clear to auscultation bilaterally. No rales or rhonchi. No wheezes.  ABDOMEN: Soft, flat, nontender, nondistended. Has good bowel sounds. No hepatosplenomegaly appreciated.  EXTREMITIES: No evidence of any cyanosis, clubbing, or peripheral edema.  +2 pedal and radial pulses bilaterally.  NEUROLOGIC: The patient is alert, awake, patient not oriented to place person or time with no focal motor or sensory deficits appreciated bilaterally.  SKIN:  Moist and warm with no rashes appreciated.  Psych: Not anxious, depressed LN: No inguinal LN enlargement    Antibiotics   Anti-infectives    None      Medications   Scheduled Meds: . aspirin  325 mg Oral Daily  . atorvastatin  20 mg Oral Daily  . cholecalciferol  2,000 Units Oral Daily  . donepezil  10 mg Oral QHS  . enoxaparin (LOVENOX) injection  40 mg Subcutaneous Q24H  . insulin aspart  0-9 Units Subcutaneous TID WC  . memantine  28 mg Oral QHS  . multivitamin with minerals  1 tablet Oral BID WC  . sodium chloride flush  3 mL Intravenous Q12H  . cyanocobalamin  500 mcg Oral Daily   Continuous Infusions:  PRN Meds:.acetaminophen **OR** acetaminophen, morphine injection, ondansetron **OR** ondansetron (ZOFRAN) IV, oxyCODONE, risperiDONE   Data Review:   Micro Results Recent Results (from the past 240 hour(s))  MRSA PCR Screening     Status: None   Collection Time: 03/07/16  4:15 PM  Result Value Ref Range Status   MRSA by PCR NEGATIVE NEGATIVE Final    Comment:        The GeneXpert MRSA Assay (FDA approved for NASAL specimens only), is one component of a comprehensive MRSA colonization surveillance program. It is not intended to diagnose MRSA infection nor to guide or monitor treatment for MRSA infections.     Radiology Reports Dg Chest 2 View  03/07/2016  CLINICAL DATA:  Altered mental status. History of hypertension and COPD. EXAM: CHEST  2 VIEW COMPARISON:  02/22/2016. FINDINGS: Normal sized heart.  The lungs remain clear with stable prominence of the interstitial markings. Diffuse osteopenia. Atheromatous calcifications in the aortic arch. IMPRESSION: 1. No acute abnormality. 2. Stable chronic interstitial lung disease. 3. Aortic atherosclerosis. Electronically Signed   By: Claudie Revering M.D.   On: 03/07/2016 12:57   Dg Chest 2 View  02/22/2016  CLINICAL DATA:  Dementia.  Altered mental status. EXAM: CHEST  2 VIEW COMPARISON:  01/20/2016 FINDINGS: Atherosclerotic calcification of the aortic arch. Aortic tortuosity. Biapical pleural parenchymal scarring. Mild pectus excavatum. Chronic interstitial accentuation.  No pleural effusion. IMPRESSION: 1. Chronic interstitial accentuation in both lungs, unchanged. 2. Atherosclerotic aortic arch. Electronically Signed   By: Van Clines M.D.   On: 02/22/2016 17:49   Ct Head Wo Contrast  02/23/2016  CLINICAL DATA:  Altered mental status, weakness, lethargy. EXAM: CT HEAD WITHOUT CONTRAST TECHNIQUE: Contiguous axial images were obtained from the base of the skull through the vertex without intravenous contrast. COMPARISON:  02/22/2016 FINDINGS: There is no intracranial hemorrhage, mass or evidence of acute infarction. There is mild generalized atrophy. There is mild chronic microvascular ischemic change. There is no significant extra-axial fluid collection. No acute intracranial findings are evident. The calvarium and skullbase are intact. Visible paranasal sinuses and orbits are unremarkable. IMPRESSION: No acute findings. There is mild atrophy and chronic appearing white matter hypodensity. Electronically Signed   By: Andreas Newport M.D.   On: 02/23/2016 02:18   Ct Head Wo Contrast  02/22/2016  CLINICAL DATA:  Altered mental status.  History dementia. EXAM: CT HEAD WITHOUT CONTRAST TECHNIQUE: Contiguous axial images were obtained from the base of the skull through the vertex without intravenous contrast. COMPARISON:  Head CT dated 01/20/2016. FINDINGS:  Brain: Again noted is mild generalized age related brain atrophy with commensurate dilatation of the ventricles and sulci. There is no mass, hemorrhage, edema or other evidence of acute parenchymal abnormality. No  extra-axial hemorrhage. Vascular: No hyperdense vessel or unexpected calcification. There are chronic calcified atherosclerotic changes of the large vessels at the skull base. Skull: Negative for fracture or focal lesion. Sinuses/Orbits: No acute findings. Visualized upper paranasal sinuses are clear. Mastoid air cells are clear. Other: None. IMPRESSION: No acute findings.  No intracranial mass, hemorrhage or edema. Electronically Signed   By: Franki Cabot M.D.   On: 02/22/2016 18:39   Mr Brain Wo Contrast  02/23/2016  CLINICAL DATA:  Right facial droop.  Confusion and lethargy. EXAM: MRI HEAD WITHOUT CONTRAST TECHNIQUE: Multiplanar, multiecho pulse sequences of the brain and surrounding structures were obtained without intravenous contrast. COMPARISON:  Head CT 02/23/2016 FINDINGS: There is mild motion artifact throughout. There is no evidence of acute infarct, intracranial hemorrhage, mass, midline shift, or extra-axial fluid collection. There is mild-to-moderate cerebral atrophy. T2 hyperintensities in the periventricular white matter and pons are nonspecific but compatible with mild chronic small vessel ischemic disease. Orbits are unremarkable. Paranasal sinuses and mastoid air cells are clear. Major intracranial vascular flow voids are preserved, with the left vertebral artery dominant. IMPRESSION: 1. No acute intracranial abnormality. 2. Mild chronic small vessel ischemic disease. Electronically Signed   By: Logan Bores M.D.   On: 02/23/2016 12:08   US Carotid Bilateral  02/23/2016  CLINICAL DATA:  TIA, hypertension and diabetes. EXAM: BILATERAL CAROTID DUPLEX ULTRASOUND TECHNIQUE: Pearline Cables scale imaging, color Doppler and duplex ultrasound were performed of bilateral carotid and vertebral  arteries in the neck. COMPARISON:  None. FINDINGS: Criteria: Quantification of carotid stenosis is based on velocity parameters that correlate the residual internal carotid diameter with NASCET-based stenosis levels, using the diameter of the distal internal carotid lumen as the denominator for stenosis measurement. The following velocity measurements were obtained: RIGHT ICA:  79/15 cm/sec CCA:  [id number] cm/sec SYSTOLIC ICA/CCA RATIO:  1.1 DIASTOLIC ICA/CCA RATIO:  1.5 ECA:  125 cm/sec LEFT ICA:  95/19 cm/sec CCA:  [id number] cm/sec SYSTOLIC ICA/CCA RATIO:  1.1 DIASTOLIC ICA/CCA RATIO:  1.8 ECA:  139 cm/sec RIGHT CAROTID ARTERY: The common and internal carotid arteries are tortuous. Minimal noncalcified plaque is noted in the carotid bulb. No evidence of ICA plaque or stenosis. RIGHT VERTEBRAL ARTERY: Antegrade flow with normal waveform and velocity. LEFT CAROTID ARTERY: Carotid arteries are tortuous in the left neck. Mild partially calcified plaque is noted at the ICA origin. Velocities and waveforms are normal and estimated left ICA stenosis is less than 50%. Minimal plaque also visualized in the common carotid artery. LEFT VERTEBRAL ARTERY: Antegrade flow with normal waveform and velocity. IMPRESSION: 1. Mild plaque in the proximal left ICA with estimated left ICA stenosis of less than 50%. 2. No evidence of plaque or stenosis in the right internal carotid artery. Electronically Signed   By: Aletta Edouard M.D.   On: 02/23/2016 11:12   Ct Head Code Stroke Wo Contrast  03/07/2016  CLINICAL DATA:  Right-sided facial droop EXAM: CT HEAD WITHOUT CONTRAST TECHNIQUE: Contiguous axial images were obtained from the base of the skull through the vertex without intravenous contrast. COMPARISON:  MRI 02/23/2016.  CT 02/23/2016. FINDINGS: The brain shows age related generalized atrophy. No evidence of old or acute small or large vessel infarction. No mass lesion, hemorrhage, hydrocephalus or extra-axial collection. The  calvarium is normal. Sinuses, middle ears and mastoids are clear. There is atherosclerotic calcification of the major vessels at the base of the brain. IMPRESSION: Age related atrophy.  No focal or acute finding. These results were called by  telephone at the time of interpretation on 03/07/2016 at 12:03 pm to Dr. Shirlyn Goltz , who verbally acknowledged these results. Electronically Signed   By: Nelson Chimes M.D.   On: 03/07/2016 12:06     CBC  Recent Labs Lab 03/07/16 1139  WBC 11.7*  HGB 12.8  HCT 38.3  PLT 897*  MCV 81.8  MCH 27.3  MCHC 33.4  RDW 15.7*  LYMPHSABS 0.7*  MONOABS 0.7  EOSABS 0.0  BASOSABS 0.1    Chemistries   Recent Labs Lab 03/07/16 1139  NA 143  K 3.8  CL 112*  CO2 21*  GLUCOSE 247*  BUN 23*  CREATININE 1.09*  CALCIUM 8.5*  AST 31  ALT 16  ALKPHOS 77  BILITOT 0.4   ------------------------------------------------------------------------------------------------------------------ estimated creatinine clearance is 30 mL/min (by C-G formula based on Cr of 1.09). ------------------------------------------------------------------------------------------------------------------  Recent Labs  03/07/16 1139  HGBA1C 5.3   ------------------------------------------------------------------------------------------------------------------  Recent Labs  03/07/16 1139  CHOL 148  HDL 47  LDLCALC 93  TRIG 41  CHOLHDL 3.1   ------------------------------------------------------------------------------------------------------------------ No results for input(s): TSH, T4TOTAL, T3FREE, THYROIDAB in the last 72 hours.  Invalid input(s): FREET3 ------------------------------------------------------------------------------------------------------------------ No results for input(s): VITAMINB12, FOLATE, FERRITIN, TIBC, IRON, RETICCTPCT in the last 72 hours.  Coagulation profile  Recent Labs Lab 03/07/16 1139  INR 1.19    No results for input(s): DDIMER  in the last 72 hours.  Cardiac Enzymes  Recent Labs Lab 03/07/16 1139  TROPONINI <0.03   ------------------------------------------------------------------------------------------------------------------ Invalid input(s): POCBNP    Assessment & Plan   82 year old African American female history of dementia presenting with worsening mental status  1. Encephalopathy: Appreciate neurology recommendations,  Recent MRI negative Questionable episodes of seizure, EEG is currently pending Patient is on sedating medications such as lorazepam and Risperdal, lorazepam at bedtime as stopped still on risperidone as needed   2.Hypokalemia:  repad replaced 3. Dementia: Continue with Aricept, Namenda 4. Hyperlipidemia unspecified Lipitor     Code Status Orders        Start     Ordered   03/07/16 1420  Full code   Continuous     03/07/16 1420    Code Status History    Date Active Date Inactive Code Status Order ID Comments User Context   02/22/2016  8:10 PM 02/24/2016  7:54 PM Full Code [id number]  Hillary Bow, MD ED   01/20/2016  7:47 PM 01/21/2016  4:40 PM Full Code [id number]  Gladstone Lighter, MD ED           Consults Neurology DVT Prophylaxis  Lovenox   Lab Results  Component Value Date   PLT 897* 03/07/2016     Time Spent in minutes  35 minutes Greater than 50% of time spent in care coordination and counseling patient regarding the condition and plan of care.   Dustin Flock M.D on 03/08/2016 at 12:30 PM  Between 7am to 6pm - Pager - [phone]  After 6pm go to [email or web address] - password [email or web address] [facility]   Office  [phone]

## 2016-03-08 NOTE — Progress Notes (Signed)
Initial Nutrition Assessment  DOCUMENTATION CODES:   Underweight  INTERVENTION:   Cater to pt preferences on Dysphagia I diet order; SLP following. Encourage intake at meal times, as pt a feeder Recommend Ensure Enlive po BID, each supplement provides 350 kcal and 20 grams of protein   NUTRITION DIAGNOSIS:   Swallowing difficulty related to dysphagia as evidenced by  (dysphagia diet order, SLP following).  GOAL:   Patient will meet greater than or equal to 90% of their needs  MONITOR:   PO intake, Supplement acceptance, Labs, I & O's, Weight trends  REASON FOR ASSESSMENT:    (Low BMI)    ASSESSMENT:   Pt admitted with new onset right sided facial droop and AMS.  Past Medical History  Diagnosis Date  . Dementia   . Hypertension   . Diabetes mellitus ([place])   . COPD (chronic obstructive pulmonary disease) ([place])     Diet Order:  DIET - DYS 1 Room service appropriate?: No; Fluid consistency:: Thin   Pt is a feeder per CNA, Lauren. Pt out of room on visit. Pt did not eat lunch per CNA as pt out of room for procedure.   Unable to clarify past po intake. Per CNA pt confused this am. No family present.   Medications:  SS Novolog, MVI, Vitamin B12 Labs: glucose 247, BUN 23, Cre 1.09   Gastrointestinal Profile: Last BM:  no BM documented   Nutrition-Focused Physical Exam Findings:  Unable to complete Nutrition-Focused physical exam at this time.    Weight Change: Per CH[facility] weight trends, pt with 8lbs weight loss in 2 weeks (7% weight loss in 2 weeks)   Skin:  Reviewed, no issues   Height:   Ht Readings from Last 1 Encounters:  03/07/16 5\' 5"  (1.651 m)    Weight:   Wt Readings from Last 1 Encounters:  03/07/16 105 lb 6.4 oz (47.809 kg)   Wt Readings from Last 10 Encounters:  03/07/16 105 lb 6.4 oz (47.809 kg)  02/24/16 113 lb 2 oz (51.313 kg)  01/21/16 113 lb 8.6 oz (51.5 kg)    Ideal Body Weight:   57kg  BMI:  Body mass index is 17.54  kg/(m^2).  Estimated Nutritional Needs:   Kcal:  1440-1680kcals  Protein:  53-63g protein  Fluid:  >/= 1.5L fluid  EDUCATION NEEDS:   Education needs no appropriate at this time  Dwyane Luo, RD, LDN Pager [phone] Weekend/On-Call Pager [phone]

## 2016-03-08 NOTE — Care Management (Signed)
Admitted to [facility] with the diagnosis of acute encephalopathy under observation status. A resident of [facility] since 10/03/11. Discharged from this facility 02/24/16. Shary Key is listed as contact person [phone]). Clancy Gourd [phone]). Spoke with [employer]. Physician Care is provided by Arnetha Courser at [facility] in [place]. This group of physicians visits the facilities. Clancy Gourd states that Ms. Scoggins would be able to return to [facility]. Transportation will be provided by the facility. Shelbie Ammons RN MSN CCM Care Management [phone]

## 2016-03-08 NOTE — Progress Notes (Signed)
Physical Therapy Evaluation Patient Details Name: Savannah Young MRN: [id number] DOB: September 12, 1931 Today's Date: 03/08/2016   History of Present Illness  Pt admitted with dx of acute encephalopathy and presents with weakness and fatigue. Pt has history of dementia, HTN, COPD, and DM.   Clinical Impression  Pt is a pleasant 82 y/o female with dementia who presents with weakness and fatigue. Pt is able to follow simple commands but was not able to inform us of prior level of function. Per documentation, pt may have been ambulatory without use of AD. Pt required min guard for bed mobility and min assist for transfers and ambulation. During gait assessment, pt demonstrated slow cadence with decrease step length and narrow BOS, no LOB noted. During ambulation with RW pt required physical cueing of moving RW in front of her to increase cadence. Pt will benefit from skilled PT in order to improve strength, balance, functional mobility, and safety with use of AD. Pt is appropriate to return to ALF while receiving HHPT.    Follow Up Recommendations Home health PT (in ALF)    Equipment Recommendations  Rolling walker with 5" wheels    Recommendations for Other Services       Precautions / Restrictions Precautions Precautions: Fall Restrictions Weight Bearing Restrictions: No      Mobility  Bed Mobility Overal bed mobility: Modified Independent Bed Mobility: Supine to Sit     Supine to sit: Min guard     General bed mobility comments: Pt able to follow simple commands. Pt able to perform bed mobility safely and required verbal cueing for scooting out to edge of bed.  Transfers Overall transfer level: Needs assistance Equipment used: Rolling walker (2 wheeled) Transfers: Sit to/from Stand Sit to Stand: Min assist         General transfer comment: Pt performed safe sit/stand technique with use of RW. Pt was steady upon standing position. Pt required tactile cueing to reach back for  armrests when going from stand to sit in recliner.  Ambulation/Gait Ambulation/Gait assistance: Min assist Ambulation Distance (Feet): 15 Feet   Gait Pattern/deviations: Step-to pattern;Decreased step length - right;Decreased step length - left;Decreased stride length;Narrow base of support   Gait velocity interpretation: Below normal speed for age/gender General Gait Details: Pt demonstrates a step-to gait pattern with slow cadence and decreased step length. Verbal cueing to pick up feet and take bigger steps. No LOB noted but patient seemed unsteady at times.   Stairs            Wheelchair Mobility    Modified Rankin (Stroke Patients Only)       Balance Overall balance assessment: Modified Independent Sitting-balance support: No upper extremity supported;Feet supported Sitting balance-Leahy Scale: Good     Standing balance support: Bilateral upper extremity supported Standing balance-Leahy Scale: Fair                               Pertinent Vitals/Pain Pain Assessment: No/denies pain    Home Living Family/patient expects to be discharged to:: Assisted living                      Prior Function           Comments: unable to asses due to impaired cognition     Hand Dominance        Extremity/Trunk Assessment   Upper Extremity Assessment: Overall WFL for tasks assessed  Lower Extremity Assessment: Generalized weakness (MMT: 4-/5. able to support weight standing. no buckling.)         Communication   Communication: No difficulties  Cognition Arousal/Alertness: Awake/alert Behavior During Therapy: Flat affect Overall Cognitive Status: History of cognitive impairments - at baseline       Memory: Decreased short-term memory              General Comments      Exercises Other Exercises Other Exercises: Supine ther-ex performed: B ankle pumps and B quad sets x 8 reps. Other Exercises: Ambulation to nurses  station and back using RW with min assist~60 feet. Pt required physical cueing of pushing RW in front of her with increased speed to increase cadence.       Assessment/Plan    PT Assessment Patient needs continued PT services  PT Diagnosis Difficulty walking;Generalized weakness   PT Problem List Decreased strength;Decreased activity tolerance;Decreased balance;Decreased mobility;Decreased cognition;Decreased knowledge of use of DME;Decreased safety awareness  PT Treatment Interventions DME instruction;Gait training;Functional mobility training;Balance training;Patient/family education   PT Goals (Current goals can be found in the Care Plan section) Acute Rehab PT Goals Patient Stated Goal: Impaired cognition PT Goal Formulation: Patient unable to participate in goal setting Time For Goal Achievement: 03/22/16 Potential to Achieve Goals: Fair    Frequency Min 2X/week   Barriers to discharge   Impaired cognition    Co-evaluation               End of Session Equipment Utilized During Treatment: Gait belt Activity Tolerance: Patient tolerated treatment well Patient left: in chair;with call bell/phone within reach;with chair alarm set;with family/visitor present Nurse Communication: Mobility status    Functional Assessment Tool Used: clinical judgement Functional Limitation: Mobility: Walking and moving around Mobility: Walking and Moving Around Current Status [phone]): At least 20 percent but less than 40 percent impaired, limited or restricted Mobility: Walking and Moving Around Goal Status [phone]): At least 1 percent but less than 20 percent impaired, limited or restricted    Time: 1454-1519 PT Time Calculation (min) (ACUTE ONLY): 25 min   Charges:   PT Evaluation $PT Eval Moderate Complexity: 1 Procedure PT Treatments $Gait Training: 8-22 mins   PT G Codes:   PT G-Codes **NOT FOR INPATIENT CLASS** Functional Assessment Tool Used: clinical judgement Functional  Limitation: Mobility: Walking and moving around Mobility: Walking and Moving Around Current Status [id number]): At least 20 percent but less than 40 percent impaired, limited or restricted Mobility: Walking and Moving Around Goal Status [phone]): At least 1 percent but less than 20 percent impaired, limited or restricted    [facility] 03/08/2016, 4:08 PM  Georgina Pillion, SPT [phone]

## 2016-03-08 NOTE — Plan of Care (Signed)
Problem: SLP Dysphagia Goals Goal: Misc Dysphagia Goal Pt will safely tolerate po diet of least restrictive consistency w/ no overt s/s of aspiration noted by Staff/pt/family x3 sessions.    

## 2016-03-08 NOTE — Care Management Obs Status (Signed)
[place] NOTIFICATION   Patient Details  Name: Savannah Young MRN: [id number] Date of Birth: 26-Jul-1932   Medicare Observation Status Notification Given:  Yes    Shelbie Ammons, RN 03/08/2016, 9:01 AM

## 2016-03-08 NOTE — Care Management (Signed)
Admitted to [facility] with the diagnosis of acute encephalopathy under observation status. Discharged from this facility 02/24/16 back to [facility]. A resident of [facility] since 10/03/11. Contact person is Shary Key [phone]) or Clancy Gourd [phone]).  Shelbie Ammons RN MSN CCM Care management [phone]

## 2016-03-09 DIAGNOSIS — G9340 Encephalopathy, unspecified: Secondary | ICD-10-CM

## 2016-03-09 LAB — CREATININE, SERUM: Creatinine, Ser: 0.69 mg/dL (ref 0.44–1.00)

## 2016-03-09 LAB — GLUCOSE, CAPILLARY
GLUCOSE-CAPILLARY: 144 mg/dL — AB (ref 65–99)
Glucose-Capillary: 79 mg/dL (ref 65–99)

## 2016-03-09 MED ORDER — DIPHENHYDRAMINE HCL 25 MG PO CAPS: 25.0000 mg | ORAL_CAPSULE | Freq: Every evening | ORAL | Status: DC | PRN

## 2016-03-09 MED ORDER — ENSURE ENLIVE PO LIQD: 237.0000 mL | Freq: Two times a day (BID) | ORAL | Status: DC

## 2016-03-09 NOTE — Progress Notes (Signed)
Subjective: Patient alert and awake.  Appears at baseline.    Objective: Current vital signs: BP 108/46 mmHg  Pulse 82  Temp(Src) 98.6 F (37 C) (Oral)  Resp 16  Ht 5\' 5"  (1.651 m)  Wt 47.809 kg (105 lb 6.4 oz)  BMI 17.54 kg/m2  SpO2 98%  LMP 01/20/2016 (Approximate) Vital signs in last 24 hours: Temp:  [98.1 F (36.7 C)-98.6 F (37 C)] 98.6 F (37 C) (07/12 0505) Pulse Rate:  [67-82] 82 (07/12 0505) Resp:  [16-20] 16 (07/12 0505) BP: (104-123)/(46-59) 108/46 mmHg (07/12 0505) SpO2:  [98 %] 98 % (07/12 0505)  Intake/Output from previous day: 07/11 0701 - 07/12 0700 In: 480 [P.O.:480] Out: -  Intake/Output this shift:   Nutritional status: DIET - DYS 1 Room service appropriate?: No; Fluid consistency:: Thin  Neurologic Exam: Mental Status: Awake and alert. Speech fluent.  Follows some simple commands.   Cranial Nerves: II: Discs flat bilaterally; Visual fields grossly normal, pupils pinpoint III,IV, VI: ptosis not present, extra-ocular motions intact bilaterally V,VII: smile symmetric VIII: hearing normal bilaterally IX,X: gag reflex present XI: bilateral shoulder shrug XII: midline tongue extension Motor: Lifts arms and legs off the bed   Lab Results: Basic Metabolic Panel:  Recent Labs Lab 03/07/16 1139 03/09/16 0548  NA 143  --   K 3.8  --   CL 112*  --   CO2 21*  --   GLUCOSE 247*  --   BUN 23*  --   CREATININE 1.09* 0.69  CALCIUM 8.5*  --     Liver Function Tests:  Recent Labs Lab 03/07/16 1139  AST 31  ALT 16  ALKPHOS 77  BILITOT 0.4  PROT 6.1*  ALBUMIN 3.5   No results for input(s): LIPASE, AMYLASE in the last 168 hours. No results for input(s): AMMONIA in the last 168 hours.  CBC:  Recent Labs Lab 03/07/16 1139  WBC 11.7*  NEUTROABS 10.3*  HGB 12.8  HCT 38.3  MCV 81.8  PLT 897*    Cardiac Enzymes:  Recent Labs Lab 03/07/16 1139  TROPONINI <0.03    Lipid Panel:  Recent Labs Lab 03/07/16 1139  CHOL 148   TRIG 41  HDL 47  CHOLHDL 3.1  VLDL 8  LDLCALC 93    CBG:  Recent Labs Lab 03/08/16 0751 03/08/16 1639 03/08/16 2154 03/09/16 0724 03/09/16 1108  GLUCAP 75 137* 94 77 144*    Microbiology: Results for orders placed or performed during the hospital encounter of 03/07/16  MRSA PCR Screening     Status: None   Collection Time: 03/07/16  4:15 PM  Result Value Ref Range Status   MRSA by PCR NEGATIVE NEGATIVE Final    Comment:        The GeneXpert MRSA Assay (FDA approved for NASAL specimens only), is one component of a comprehensive MRSA colonization surveillance program. It is not intended to diagnose MRSA infection nor to guide or monitor treatment for MRSA infections.     Coagulation Studies:  Recent Labs  03/07/16 1139  LABPROT 15.3*  INR 1.19    Imaging: Dg Chest 2 View  03/07/2016  CLINICAL DATA:  Altered mental status. History of hypertension and COPD. EXAM: CHEST  2 VIEW COMPARISON:  02/22/2016. FINDINGS: Normal sized heart. The lungs remain clear with stable prominence of the interstitial markings. Diffuse osteopenia. Atheromatous calcifications in the aortic arch. IMPRESSION: 1. No acute abnormality. 2. Stable chronic interstitial lung disease. 3. Aortic atherosclerosis. Electronically Signed   By: Remo Lipps  Joneen Caraway M.D.   On: 03/07/2016 12:57   Ct Head Code Stroke Wo Contrast  03/07/2016  CLINICAL DATA:  Right-sided facial droop EXAM: CT HEAD WITHOUT CONTRAST TECHNIQUE: Contiguous axial images were obtained from the base of the skull through the vertex without intravenous contrast. COMPARISON:  MRI 02/23/2016.  CT 02/23/2016. FINDINGS: The brain shows age related generalized atrophy. No evidence of old or acute small or large vessel infarction. No mass lesion, hemorrhage, hydrocephalus or extra-axial collection. The calvarium is normal. Sinuses, middle ears and mastoids are clear. There is atherosclerotic calcification of the major vessels at the base of the  brain. IMPRESSION: Age related atrophy.  No focal or acute finding. These results were called by telephone at the time of interpretation on 03/07/2016 at 12:03 pm to Dr. Shirlyn Goltz , who verbally acknowledged these results. Electronically Signed   By: Nelson Chimes M.D.   On: 03/07/2016 12:06    Medications:  I have reviewed the patient's current medications. Scheduled: . aspirin  325 mg Oral Daily  . atorvastatin  20 mg Oral Daily  . cholecalciferol  2,000 Units Oral Daily  . donepezil  10 mg Oral QHS  . enoxaparin (LOVENOX) injection  40 mg Subcutaneous Q24H  . feeding supplement (ENSURE ENLIVE)  237 mL Oral BID BM  . insulin aspart  0-9 Units Subcutaneous TID WC  . memantine  28 mg Oral QHS  . multivitamin with minerals  1 tablet Oral BID WC  . sodium chloride flush  3 mL Intravenous Q12H  . cyanocobalamin  500 mcg Oral Daily    Assessment/Plan: Patient appears to be back to baseline.  MRI of the brain personally reviewed and shows no acute changes.  EEG shows no epileptiform discharges.  No source of infection identified.  Urine drug screen was positive for benzodiazepines.  Unclear if she may be having a protracted response to this type medication.     Recommendations: 1.  Would discontinue use of sedating medications which may lead to decreasing dose of antipsychotic as well but this may be addressed as an outpatient by her prescribing physician.   2.  No further inpatient neurological evaluation recommended at this time.      Alexis Goodell, MD Neurology [phone] 03/09/2016  11:12 AM

## 2016-03-09 NOTE — Progress Notes (Signed)
Speech Language Pathology Treatment: Dysphagia  Patient Details Name: Savannah Young MRN: [id number] DOB: [birth year] Today's Date: 03/09/2016 Time: 1000-1030 SLP Time Calculation (min) (ACUTE ONLY): 30 min  Assessment / Plan / Recommendation Clinical Impression  Pt alert and smiling but largely nonverbal during session. Architectural technologist was present and education provided on recent diet downgrade and rationale for downgrade. Director expressed understanding. Pt consumed Ensure via straw without s/s of aspiration. She effectively consumed at functional rate. Director requested follow up ST services for possibility of advancing diet when pt's health improves. Case manager was made aware and will order. Continue with current plan of care.    HPI HPI: Pt is a 82 y.o. female with a known history of Dementia baseline, HTN, COPD, DM per documentation she is ambulatory but confused. The patient is unable to provide meaningful information given mental status medical condition She is presenting from her nursing facility after being found slumped over in her chair. She also believed to have new onset right-sided facial droop. Neurology consulted and assessed pt stating pt was presenting with lethargy and right facial droop.This appeared to be the same presentation she had with her last admission.Head CT personally reviewed and shows no acute changes. No further stroke workup performed per NSG report. Pt is still sleepy but awakened and gave a few verbal responses to SLP. She followed 2-3 basic commands and identified 2-3 food/drink preferences.        SLP Plan  Continue with current plan of care     Recommendations  Diet recommendations: Dysphagia 1 (puree);Thin liquid Liquids provided via: Cup;Straw Medication Administration: Crushed with puree Supervision: Staff to assist with self feeding;Intermittent supervision to cue for compensatory strategies Compensations: Minimize environmental  distractions;Slow rate;Small sips/bites;Follow solids with liquid Postural Changes and/or Swallow Maneuvers: Seated upright 90 degrees             Oral Care Recommendations: Oral care BID Follow up Recommendations: Home health SLP Plan: Continue with current plan of care     GO           Functional Assessment Tool Used: clinical judgement Functional Limitations: Swallowing Swallow Current Status [id number]): At least 1 percent but less than 20 percent impaired, limited or restricted Swallow Goal Status [phone]): At least 1 percent but less than 20 percent impaired, limited or restricted Swallow Discharge Status [phone]): At least 1 percent but less than 20 percent impaired, limited or restricted    Savannah Young 03/09/2016, 10:33 AM

## 2016-03-09 NOTE — NC FL2 (Signed)
[place] LEVEL OF CARE SCREENING TOOL     IDENTIFICATION  Patient Name: Savannah Young Birthdate: [birth year] Sex: female Admission Date (Current Location): 03/07/2016  [place] and [place] Number:  Engineering geologist and Address:  [facility], [address] [place], [place] [address]      Provider Number: [id number]  Attending Physician Name and Address:  Fritzi Mandes, MD  Relative Name and Phone Number:       Current Level of Care: Hospital Recommended Level of Care: Memory Care  ALF  Prior Approval Number:    Date Approved/Denied:   PASRR Number: [id number]  Discharge Plan: ALF (Memory Care)     Current Diagnoses: Patient Active Problem List   Diagnosis Date Noted  . Encephalopathy acute 03/07/2016  . Pressure ulcer 02/23/2016  . Confusion 02/22/2016  . Agitation 01/20/2016    Orientation RESPIRATION BLADDER Height & Weight     Self  Normal Continent Weight: 105 lb 6.4 oz (47.809 kg) Height:  5\' 5"  (165.1 cm)  BEHAVIORAL SYMPTOMS/MOOD NEUROLOGICAL BOWEL NUTRITION STATUS   (None)  (None) Continent  Diet Discharge Recommendations:  Dysphagia 1 w/ thin liquids; strict aspiration precautions; Meds in Puree - Crushed as able or in liquid form. Feeding assistance at all meals. Dietary supplements per Dietician - drink form best.        AMBULATORY STATUS COMMUNICATION OF NEEDS Skin   Limited Assist Verbally Normal                       Personal Care Assistance Level of Assistance  Bathing, Feeding, Dressing Bathing Assistance: Limited assistance Feeding assistance: Independent Dressing Assistance: Limited assistance     Functional Limitations Info  Sight, Hearing, Speech Sight Info: Adequate Hearing Info: Adequate Speech Info: Adequate    SPECIAL CARE FACTORS FREQUENCY  PT (By licensed PT)     [facility] 2- 3 days per week.               Contractures      Additional Factors Info  Code  Status, Allergies Code Status Info: Full Code Allergies Info: No known allergies           Discharge Medications: Please see discharge summary for a list of discharge medications. DISCHARGE MEDICATIONS:   Current Discharge Medication List    START taking these medications   Details  feeding supplement, ENSURE ENLIVE, (ENSURE ENLIVE) LIQD Take 237 mLs by mouth 2 (two) times daily between meals. Qty: 237 mL, Refills: 12      CONTINUE these medications which have CHANGED   Details  diphenhydrAMINE (BENADRYL) 25 mg capsule Take 1 capsule (25 mg total) by mouth at bedtime as needed for itching. Qty: 30 capsule, Refills: 0      CONTINUE these medications which have NOT CHANGED   Details  acetaminophen (TYLENOL) 500 MG tablet Take 500 mg by mouth 3 (three) times daily as needed for mild pain.    albuterol (PROVENTIL HFA;VENTOLIN HFA) 108 (90 Base) MCG/ACT inhaler Inhale 2 puffs into the lungs every 4 (four) hours as needed for wheezing or shortness of breath.    aspirin 325 MG EC tablet Take 1 tablet (325 mg total) by mouth daily. Qty: 30 tablet, Refills: 0    atorvastatin (LIPITOR) 20 MG tablet Take 1 tablet (20 mg total) by mouth daily. Qty: 30 tablet, Refills: 0    cholecalciferol (VITAMIN D) 1000 units tablet Take 2,000 Units by mouth daily.  Cranberry 425 MG CAPS Take 425 mg by mouth daily.    cyanocobalamin 500 MCG tablet Take 500 mcg by mouth daily.    donepezil (ARICEPT) 10 MG tablet Take 10 mg by mouth at bedtime.    guaifenesin (ROBITUSSIN) 100 MG/5ML syrup Take 200 mg by mouth 3 (three) times daily as needed for cough.    memantine (NAMENDA XR) 28 MG CP24 24 hr capsule Take 28 mg by mouth at bedtime.    Multiple Vitamins-Minerals (ICAPS MV) TABS Take 1 tablet by mouth 2 (two) times daily with a meal.    risperiDONE (RISPERDAL M-TABS) 0.5 MG disintegrating tablet Take 0.5 mg by mouth 2 (two) times daily as  needed (for agitation).      STOP taking these medications     LORazepam (ATIVAN) 0.5 MG tablet            Relevant Imaging Results:  Relevant Lab Results:   Additional Information SSN [id number]  Loralyn Freshwater, [place]

## 2016-03-09 NOTE — Progress Notes (Signed)
Patient is medically stable for D/C back to [facility] today. Per [facility] administrator patient can return today and she will pick patient up at 2:30. Clinical Education officer, museum (CSW) sent D/C Summary and FL2 to [employer]. RN aware of above. Patient is aware of above. CSW attempted to contact patient's relative listed on the face sheet Mardene Celeste however she did not answer and a voicemail was left. Please reconsult if future social work needs arise. CSW signing off.   Blima Rich, LCSW [phone]

## 2016-03-09 NOTE — Discharge Summary (Signed)
[facility] [place] NAME: Savannah Young    MR#:  [id number]  DATE OF BIRTH:  1932-04-02  DATE OF ADMISSION:  03/07/2016 ADMITTING PHYSICIAN: Lytle Butte, MD  DATE OF DISCHARGE: 03/09/16  PRIMARY CARE PHYSICIAN: No primary care provider on file.    ADMISSION DIAGNOSIS:  Facial droop [R29.810]  DISCHARGE DIAGNOSIS:  Acute encephalopathy due to meds(benzodiazepines) Known h/o dementia SECONDARY DIAGNOSIS:   Past Medical History  Diagnosis Date  . Dementia   . Hypertension   . Diabetes mellitus ([place])   . COPD (chronic obstructive pulmonary disease) [facility])     HOSPITAL COURSE:   82 year old African American female history of dementia presenting with worsening mental status  1. Encephalopathy: Appreciate neurology recommendations likley due to benzodiazepines. Will d/c it EEG no epilepitform activity noted Recent MRI negative D/c ativan.  -Cont aricept and ripserdal -back to baseline  2.Hypokalemia:  Replaced  3. Dementia: Continue with Aricept, Namenda  4. Hyperlipidemia unspecified Lipitor  D/c back to [facility] CONSULTS OBTAINED:  Treatment Team:  Catarina Hartshorn, MD Lytle Butte, MD Alexis Goodell, MD  DRUG ALLERGIES:  No Known Allergies  DISCHARGE MEDICATIONS:   Current Discharge Medication List    START taking these medications   Details  feeding supplement, ENSURE ENLIVE, (ENSURE ENLIVE) LIQD Take 237 mLs by mouth 2 (two) times daily between meals. Qty: 237 mL, Refills: 12      CONTINUE these medications which have CHANGED   Details  diphenhydrAMINE (BENADRYL) 25 mg capsule Take 1 capsule (25 mg total) by mouth at bedtime as needed for itching. Qty: 30 capsule, Refills: 0      CONTINUE these medications which have NOT CHANGED   Details  acetaminophen (TYLENOL) 500 MG tablet Take 500 mg by mouth 3 (three) times daily as needed for mild pain.    albuterol (PROVENTIL HFA;VENTOLIN HFA) 108  (90 Base) MCG/ACT inhaler Inhale 2 puffs into the lungs every 4 (four) hours as needed for wheezing or shortness of breath.    aspirin 325 MG EC tablet Take 1 tablet (325 mg total) by mouth daily. Qty: 30 tablet, Refills: 0    atorvastatin (LIPITOR) 20 MG tablet Take 1 tablet (20 mg total) by mouth daily. Qty: 30 tablet, Refills: 0    cholecalciferol (VITAMIN D) 1000 units tablet Take 2,000 Units by mouth daily.    Cranberry 425 MG CAPS Take 425 mg by mouth daily.    cyanocobalamin 500 MCG tablet Take 500 mcg by mouth daily.    donepezil (ARICEPT) 10 MG tablet Take 10 mg by mouth at bedtime.    guaifenesin (ROBITUSSIN) 100 MG/5ML syrup Take 200 mg by mouth 3 (three) times daily as needed for cough.    memantine (NAMENDA XR) 28 MG CP24 24 hr capsule Take 28 mg by mouth at bedtime.    Multiple Vitamins-Minerals (ICAPS MV) TABS Take 1 tablet by mouth 2 (two) times daily with a meal.    risperiDONE (RISPERDAL M-TABS) 0.5 MG disintegrating tablet Take 0.5 mg by mouth 2 (two) times daily as needed (for agitation).      STOP taking these medications     LORazepam (ATIVAN) 0.5 MG tablet         If you experience worsening of your admission symptoms, develop shortness of breath, life threatening emergency, suicidal or homicidal thoughts you must seek medical attention immediately by calling 911 or calling your MD immediately  if symptoms less severe.  You Must  read complete instructions/literature along with all the possible adverse reactions/side effects for all the Medicines you take and that have been prescribed to you. Take any new Medicines after you have completely understood and accept all the possible adverse reactions/side effects.   Please note  You were cared for by a hospitalist during your hospital stay. If you have any questions about your discharge medications or the care you received while you were in the hospital after you are discharged, you can call the unit and asked  to speak with the hospitalist on call if the hospitalist that took care of you is not available. Once you are discharged, your primary care physician will handle any further medical issues. Please note that NO REFILLS for any discharge medications will be authorized once you are discharged, as it is imperative that you return to your primary care physician (or establish a relationship with a primary care physician if you do not have one) for your aftercare needs so that they can reassess your need for medications and monitor your lab values. Today   SUBJECTIVE   Trying to use the potty by herself with help of CNA   VITAL SIGNS:  Blood pressure 108/46, pulse 82, temperature 98.6 F (37 C), temperature source Oral, resp. rate 16, height 5\' 5"  (1.651 m), weight 47.809 kg (105 lb 6.4 oz), last menstrual period 01/20/2016, SpO2 98 %.  I/O:   Intake/Output Summary (Last 24 hours) at 03/09/16 1226 Last data filed at 03/08/16 1431  Gross per 24 hour  Intake    240 ml  Output      0 ml  Net    240 ml    PHYSICAL EXAMINATION:  GENERAL:  82 y.o.-year-old patient lying in the bed with no acute distress.  EYES: Pupils equal, round, reactive to light and accommodation. No scleral icterus. Extraocular muscles intact.  HEENT: Head atraumatic, normocephalic. Oropharynx and nasopharynx clear.  NECK:  Supple, no jugular venous distention. No thyroid enlargement, no tenderness.  LUNGS: Normal breath sounds bilaterally, no wheezing, rales,rhonchi or crepitation. No use of accessory muscles of respiration.  CARDIOVASCULAR: S1, S2 normal. No murmurs, rubs, or gallops.  ABDOMEN: Soft, non-tender, non-distended. Bowel sounds present. No organomegaly or mass.  EXTREMITIES: No pedal edema, cyanosis, or clubbing.  NEUROLOGIC: overall non focal but grossly intact PSYCHIATRIC: patient is alert  SKIN: No obvious rash, lesion, or ulcer.   DATA REVIEW:   CBC   Recent Labs Lab 03/07/16 1139  WBC 11.7*  HGB  12.8  HCT 38.3  PLT 897*    Chemistries   Recent Labs Lab 03/07/16 1139 03/09/16 0548  NA 143  --   K 3.8  --   CL 112*  --   CO2 21*  --   GLUCOSE 247*  --   BUN 23*  --   CREATININE 1.09* 0.69  CALCIUM 8.5*  --   AST 31  --   ALT 16  --   ALKPHOS 77  --   BILITOT 0.4  --     Microbiology Results   Recent Results (from the past 240 hour(s))  MRSA PCR Screening     Status: None   Collection Time: 03/07/16  4:15 PM  Result Value Ref Range Status   MRSA by PCR NEGATIVE NEGATIVE Final    Comment:        The GeneXpert MRSA Assay (FDA approved for NASAL specimens only), is one component of a comprehensive MRSA colonization surveillance program. It is not  intended to diagnose MRSA infection nor to guide or monitor treatment for MRSA infections.     RADIOLOGY:  Dg Chest 2 View  03/07/2016  CLINICAL DATA:  Altered mental status. History of hypertension and COPD. EXAM: CHEST  2 VIEW COMPARISON:  02/22/2016. FINDINGS: Normal sized heart. The lungs remain clear with stable prominence of the interstitial markings. Diffuse osteopenia. Atheromatous calcifications in the aortic arch. IMPRESSION: 1. No acute abnormality. 2. Stable chronic interstitial lung disease. 3. Aortic atherosclerosis. Electronically Signed   By: Claudie Revering M.D.   On: 03/07/2016 12:57     Management plans discussed with the patient, family and they are in agreement.  CODE STATUS:     Code Status Orders        Start     Ordered   03/07/16 1420  Full code   Continuous     03/07/16 1420    Code Status History    Date Active Date Inactive Code Status Order ID Comments User Context   02/22/2016  8:10 PM 02/24/2016  7:54 PM Full Code [id number]  Hillary Bow, MD ED   01/20/2016  7:47 PM 01/21/2016  4:40 PM Full Code [id number]  Gladstone Lighter, MD ED      TOTAL TIME TAKING CARE OF THIS PATIENT:40 minutes.    Nikolina Simerson M.D on 03/09/2016 at 12:26 PM  Between 7am to 6pm - Pager -  [phone] After 6pm go to [email or web address] - password [email or web address] [facility]  [facility]  Office  [phone]  CC: Primary care physician; No primary care provider on file.

## 2016-10-14 ENCOUNTER — Emergency Department: Payer: Medicare Other

## 2016-10-14 ENCOUNTER — Observation Stay
Admission: EM | Admit: 2016-10-14 | Discharge: 2016-10-16 | Disposition: A | Discharge status: Skilled Nursing Facility | Payer: Medicare Other | Attending: Internal Medicine | Admitting: Internal Medicine

## 2016-10-14 ENCOUNTER — Encounter: Payer: Self-pay | Admitting: Medical Oncology

## 2016-10-14 DIAGNOSIS — K210 Gastro-esophageal reflux disease with esophagitis: Secondary | ICD-10-CM | POA: Insufficient documentation

## 2016-10-14 DIAGNOSIS — K31811 Angiodysplasia of stomach and duodenum with bleeding: Secondary | ICD-10-CM | POA: Diagnosis not present

## 2016-10-14 DIAGNOSIS — Z5189 Encounter for other specified aftercare: Secondary | ICD-10-CM

## 2016-10-14 DIAGNOSIS — D75839 Thrombocytosis, unspecified: Secondary | ICD-10-CM

## 2016-10-14 DIAGNOSIS — R410 Disorientation, unspecified: Secondary | ICD-10-CM

## 2016-10-14 DIAGNOSIS — R4182 Altered mental status, unspecified: Principal | ICD-10-CM | POA: Insufficient documentation

## 2016-10-14 DIAGNOSIS — E119 Type 2 diabetes mellitus without complications: Secondary | ICD-10-CM | POA: Insufficient documentation

## 2016-10-14 DIAGNOSIS — K3189 Other diseases of stomach and duodenum: Secondary | ICD-10-CM | POA: Diagnosis not present

## 2016-10-14 DIAGNOSIS — D649 Anemia, unspecified: Secondary | ICD-10-CM

## 2016-10-14 DIAGNOSIS — D473 Essential (hemorrhagic) thrombocythemia: Secondary | ICD-10-CM

## 2016-10-14 DIAGNOSIS — I10 Essential (primary) hypertension: Secondary | ICD-10-CM | POA: Diagnosis not present

## 2016-10-14 DIAGNOSIS — J449 Chronic obstructive pulmonary disease, unspecified: Secondary | ICD-10-CM | POA: Diagnosis not present

## 2016-10-14 DIAGNOSIS — F419 Anxiety disorder, unspecified: Secondary | ICD-10-CM | POA: Insufficient documentation

## 2016-10-14 DIAGNOSIS — R262 Difficulty in walking, not elsewhere classified: Secondary | ICD-10-CM

## 2016-10-14 DIAGNOSIS — R531 Weakness: Secondary | ICD-10-CM | POA: Insufficient documentation

## 2016-10-14 DIAGNOSIS — F0391 Unspecified dementia with behavioral disturbance: Secondary | ICD-10-CM | POA: Diagnosis not present

## 2016-10-14 DIAGNOSIS — D509 Iron deficiency anemia, unspecified: Secondary | ICD-10-CM | POA: Diagnosis not present

## 2016-10-14 DIAGNOSIS — R198 Other specified symptoms and signs involving the digestive system and abdomen: Secondary | ICD-10-CM

## 2016-10-14 DIAGNOSIS — I471 Supraventricular tachycardia: Secondary | ICD-10-CM | POA: Diagnosis not present

## 2016-10-14 DIAGNOSIS — I639 Cerebral infarction, unspecified: Secondary | ICD-10-CM

## 2016-10-14 DIAGNOSIS — G459 Transient cerebral ischemic attack, unspecified: Secondary | ICD-10-CM | POA: Diagnosis present

## 2016-10-14 LAB — URINALYSIS, COMPLETE (UACMP) WITH MICROSCOPIC
BACTERIA UA: NONE SEEN
BILIRUBIN URINE: NEGATIVE
GLUCOSE, UA: NEGATIVE mg/dL
HGB URINE DIPSTICK: NEGATIVE
KETONES UR: NEGATIVE mg/dL
LEUKOCYTES UA: NEGATIVE
NITRITE: NEGATIVE
PH: 6 (ref 5.0–8.0)
Protein, ur: NEGATIVE mg/dL
Specific Gravity, Urine: 1.012 (ref 1.005–1.030)
Squamous Epithelial / LPF: NONE SEEN

## 2016-10-14 LAB — LACTATE DEHYDROGENASE: LDH: 223 U/L — ABNORMAL HIGH (ref 98–192)

## 2016-10-14 LAB — CBC
HEMATOCRIT: 23.9 % — AB (ref 35.0–47.0)
Hemoglobin: 7.2 g/dL — ABNORMAL LOW (ref 12.0–16.0)
MCH: 18.7 pg — ABNORMAL LOW (ref 26.0–34.0)
MCHC: 30.3 g/dL — ABNORMAL LOW (ref 32.0–36.0)
MCV: 61.6 fL — ABNORMAL LOW (ref 80.0–100.0)
PLATELETS: 1301 10*3/uL — AB (ref 150–440)
RBC: 3.87 MIL/uL (ref 3.80–5.20)
RDW: 20.3 % — AB (ref 11.5–14.5)
WBC: 5.2 10*3/uL (ref 3.6–11.0)

## 2016-10-14 LAB — COMPREHENSIVE METABOLIC PANEL
ALT: 12 U/L — ABNORMAL LOW (ref 14–54)
AST: 20 U/L (ref 15–41)
Albumin: 3.3 g/dL — ABNORMAL LOW (ref 3.5–5.0)
Alkaline Phosphatase: 111 U/L (ref 38–126)
Anion gap: 6 (ref 5–15)
BILIRUBIN TOTAL: 0.3 mg/dL (ref 0.3–1.2)
BUN: 14 mg/dL (ref 6–20)
CO2: 24 mmol/L (ref 22–32)
Calcium: 8.6 mg/dL — ABNORMAL LOW (ref 8.9–10.3)
Chloride: 111 mmol/L (ref 101–111)
Creatinine, Ser: 0.88 mg/dL (ref 0.44–1.00)
GFR calc Af Amer: >60 mL/min (ref >60)
GFR calc non Af Amer: 60 mL/min — ABNORMAL LOW (ref >60)
Glucose, Bld: 93 mg/dL (ref 65–99)
POTASSIUM: 4 mmol/L (ref 3.5–5.1)
Sodium: 141 mmol/L (ref 135–145)
TOTAL PROTEIN: 6.2 g/dL — AB (ref 6.5–8.1)

## 2016-10-14 LAB — IRON AND TIBC
IRON: 15 ug/dL — AB (ref 28–170)
Saturation Ratios: 4 % — ABNORMAL LOW (ref 10.4–31.8)
TIBC: 414 ug/dL (ref 250–450)
UIBC: 399 ug/dL

## 2016-10-14 LAB — TROPONIN I: Troponin I: <0.03 ng/mL (ref <0.03)

## 2016-10-14 LAB — TSH: TSH: 1.329 u[IU]/mL (ref 0.350–4.500)

## 2016-10-14 LAB — MRSA PCR SCREENING: MRSA by PCR: NEGATIVE

## 2016-10-14 MED ORDER — MELATONIN 5 MG PO TABS
10.0000 mg | ORAL_TABLET | Freq: Every day | ORAL | Status: DC
  Administered 2016-10-14 – 2016-10-15 (×2): 10 mg via ORAL
  Filled 2016-10-14 (×3): qty 2

## 2016-10-14 MED ORDER — DIPHENHYDRAMINE HCL 25 MG PO CAPS: 25.0000 mg | ORAL_CAPSULE | Freq: Every evening | ORAL | Status: DC | PRN

## 2016-10-14 MED ORDER — DOCUSATE SODIUM 100 MG PO CAPS: 100.0000 mg | ORAL_CAPSULE | Freq: Two times a day (BID) | ORAL | Status: DC | PRN

## 2016-10-14 MED ORDER — HEPARIN SODIUM (PORCINE) 5000 UNIT/ML IJ SOLN
5000.0000 [IU] | Freq: Three times a day (TID) | INTRAMUSCULAR | Status: DC
  Administered 2016-10-14 – 2016-10-16 (×4): 5000 [IU] via SUBCUTANEOUS
  Filled 2016-10-14 (×4): qty 1

## 2016-10-14 MED ORDER — ASPIRIN EC 325 MG PO TBEC
325.0000 mg | DELAYED_RELEASE_TABLET | Freq: Every day | ORAL | Status: DC
Start: 2016-10-15 — End: 2016-10-15

## 2016-10-14 MED ORDER — ALPRAZOLAM 0.25 MG PO TABS: 0.2500 mg | ORAL_TABLET | Freq: Three times a day (TID) | ORAL | Status: DC | PRN

## 2016-10-14 MED ORDER — GUAIFENESIN 100 MG/5ML PO SYRP
200.0000 mg | ORAL_SOLUTION | Freq: Three times a day (TID) | ORAL | Status: DC | PRN
  Filled 2016-10-14: qty 10

## 2016-10-14 MED ORDER — ATORVASTATIN CALCIUM 20 MG PO TABS
20.0000 mg | ORAL_TABLET | Freq: Every day | ORAL | Status: DC
  Administered 2016-10-15: 20 mg via ORAL
  Filled 2016-10-14: qty 1

## 2016-10-14 MED ORDER — ACETAMINOPHEN 500 MG PO TABS: 500.0000 mg | ORAL_TABLET | Freq: Three times a day (TID) | ORAL | Status: DC | PRN

## 2016-10-14 MED ORDER — CRANBERRY 425 MG PO CAPS: 425.0000 mg | ORAL_CAPSULE | Freq: Every day | ORAL | Status: DC

## 2016-10-14 MED ORDER — MEMANTINE HCL ER 28 MG PO CP24
28.0000 mg | ORAL_CAPSULE | Freq: Every day | ORAL | Status: DC
  Administered 2016-10-14 – 2016-10-15 (×2): 28 mg via ORAL
  Filled 2016-10-14 (×3): qty 1

## 2016-10-14 MED ORDER — LORATADINE 10 MG PO TABS: 10.0000 mg | ORAL_TABLET | Freq: Every day | ORAL | Status: DC

## 2016-10-14 MED ORDER — IBUPROFEN 600 MG PO TABS: 800.0000 mg | ORAL_TABLET | Freq: Two times a day (BID) | ORAL | Status: DC | PRN

## 2016-10-14 MED ORDER — ENSURE ENLIVE PO LIQD
237.0000 mL | Freq: Two times a day (BID) | ORAL | Status: DC
  Administered 2016-10-15 – 2016-10-16 (×3): 237 mL via ORAL

## 2016-10-14 NOTE — H&P (Signed)
[facility] NAME: Savannah Young    MR#:  [id number]  DATE OF BIRTH:  1932-08-17  DATE OF ADMISSION:  10/14/2016  PRIMARY CARE PHYSICIAN: No PCP Per Patient   REQUESTING/REFERRING PHYSICIAN: schaevitz  CHIEF COMPLAINT:   Chief Complaint  Patient presents with  . Altered Mental Status    HISTORY OF PRESENT ILLNESS: Savannah Young  is a 83 y.o. female with a known history of COPD, dementia, diabetes, hypertension- lives at assisted living place because of dementia, today they called her niece that she is not very well responsive. They found she is barely arousable this morning around lunchtime and check her blood sugar it was 109, they also did not notice any abnormalities in her vital signs so finally decided to send her to emergency room. In ER when I saw her she is talking, alert and almost back to her baseline. At her baseline she is alert and talked to her family members she needs some support in her daily activities but she is able to walk without any difficulty and she eats without any problem. She was also noted to have significant drop in her hemoglobin compared to previous readings but her stool guaiac was negative as per ER physician and her platelets are significantly high this time so she is given his admission to hospitalist team.  PAST MEDICAL HISTORY:   Past Medical History:  Diagnosis Date  . COPD (chronic obstructive pulmonary disease) ([place])   . Dementia   . Diabetes mellitus ([place])   . Hypertension     PAST SURGICAL HISTORY: History reviewed. No pertinent surgical history.  SOCIAL HISTORY:  Social History  Substance Use Topics  . Smoking status: Never Smoker  . Smokeless tobacco: Not on file  . Alcohol use No    FAMILY HISTORY:  Family History  Problem Relation Age of Onset  . Hypertension Other     DRUG ALLERGIES: No Known Allergies  REVIEW OF SYSTEMS:   Because of dementia patient is not giving me  any review of system or history.  MEDICATIONS AT HOME:  Prior to Admission medications   Medication Sig Start Date End Date Taking? Authorizing Provider  acetaminophen (TYLENOL) 500 MG tablet Take 500 mg by mouth 3 (three) times daily as needed for mild pain.   Yes Historical Provider, MD  ALPRAZolam Duanne Moron) 0.5 MG tablet Take 0.5 mg by mouth 3 (three) times daily as needed. 07/28/16  Yes Historical Provider, MD  aspirin 325 MG EC tablet Take 1 tablet (325 mg total) by mouth daily. 02/24/16  Yes Theodoro Grist, MD  atorvastatin (LIPITOR) 20 MG tablet Take 1 tablet (20 mg total) by mouth daily. 02/24/16  Yes Theodoro Grist, MD  Cranberry 425 MG CAPS Take 425 mg by mouth daily.   Yes Historical Provider, MD  diphenhydrAMINE (BENADRYL) 25 mg capsule Take 1 capsule (25 mg total) by mouth at bedtime as needed for itching. 03/09/16  Yes Fritzi Mandes, MD  fexofenadine (ALLEGRA) 180 MG tablet Take 180 mg by mouth daily.   Yes Historical Provider, MD  guaifenesin (ROBITUSSIN) 100 MG/5ML syrup Take 200 mg by mouth 3 (three) times daily as needed for cough.   Yes Historical Provider, MD  ibuprofen (ADVIL,MOTRIN) 800 MG tablet Take 800 mg by mouth 2 (two) times daily. 09/23/16  Yes Historical Provider, MD  Melatonin 10 MG TABS Take 10 mg by mouth at bedtime.   Yes Historical Provider, MD  memantine (NAMENDA XR) 28  MG CP24 24 hr capsule Take 28 mg by mouth at bedtime.   Yes Historical Provider, MD  feeding supplement, ENSURE ENLIVE, (ENSURE ENLIVE) LIQD Take 237 mLs by mouth 2 (two) times daily between meals. 03/09/16   Fritzi Mandes, MD      PHYSICAL EXAMINATION:   VITAL SIGNS: Blood pressure (!) 138/52, pulse 83, temperature 98.6 F (37 C), temperature source Oral, resp. rate 18, height 5' (1.524 m), weight 47.6 kg (105 lb), last menstrual period 01/20/2016, SpO2 100 %.  GENERAL:  83 y.o.-year-old patient lying in the bed with no acute distress.  EYES: Pupils equal, round, reactive to light and accommodation. No  scleral icterus. Extraocular muscles intact.  HEENT: Head atraumatic, normocephalic. Oropharynx and nasopharynx clear. Conjunctiva pale. NECK:  Supple, no jugular venous distention. No thyroid enlargement, no tenderness.  LUNGS: Normal breath sounds bilaterally, no wheezing, rales,rhonchi or crepitation. No use of accessory muscles of respiration.  CARDIOVASCULAR: S1, S2 normal. No murmurs, rubs, or gallops.  ABDOMEN: Soft, nontender, nondistended. Bowel sounds present. No organomegaly or mass.  EXTREMITIES: No pedal edema, cyanosis, or clubbing.  NEUROLOGIC: Cranial nerves II through XII are intact. Muscle strength 4/5 in all extremities. Sensation intact. Gait not checked.  PSYCHIATRIC: The patient is alert and not oriented.  SKIN: No obvious rash, lesion, or ulcer.   LABORATORY PANEL:   CBC  Recent Labs Lab 10/14/16 1236  WBC 5.2  HGB 7.2*  HCT 23.9*  PLT 1,301*  MCV 61.6*  MCH 18.7*  MCHC 30.3*  RDW 20.3*   ------------------------------------------------------------------------------------------------------------------  Chemistries   Recent Labs Lab 10/14/16 1236  NA 141  K 4.0  CL 111  CO2 24  GLUCOSE 93  BUN 14  CREATININE 0.88  CALCIUM 8.6*  AST 20  ALT 12*  ALKPHOS 111  BILITOT 0.3   ------------------------------------------------------------------------------------------------------------------ estimated creatinine clearance is 35.4 mL/min (by C-G formula based on SCr of 0.88 mg/dL). ------------------------------------------------------------------------------------------------------------------ No results for input(s): TSH, T4TOTAL, T3FREE, THYROIDAB in the last 72 hours.  Invalid input(s): FREET3   Coagulation profile No results for input(s): INR, PROTIME in the last 168 hours. ------------------------------------------------------------------------------------------------------------------- No results for input(s): DDIMER in the last 72  hours. -------------------------------------------------------------------------------------------------------------------  Cardiac Enzymes  Recent Labs Lab 10/14/16 1236  TROPONINI <0.03   ------------------------------------------------------------------------------------------------------------------ Invalid input(s): POCBNP  ---------------------------------------------------------------------------------------------------------------  Urinalysis    Component Value Date/Time   COLORURINE YELLOW (A) 10/14/2016 1257   APPEARANCEUR CLEAR (A) 10/14/2016 1257   APPEARANCEUR Clear 05/31/2014 1610   LABSPEC 1.012 10/14/2016 1257   LABSPEC 1.006 05/31/2014 1610   PHURINE 6.0 10/14/2016 1257   GLUCOSEU NEGATIVE 10/14/2016 1257   GLUCOSEU Negative 05/31/2014 1610   HGBUR NEGATIVE 10/14/2016 [place] 10/14/2016 1257   BILIRUBINUR Negative 05/31/2014 1610   KETONESUR NEGATIVE 10/14/2016 1257   PROTEINUR NEGATIVE 10/14/2016 1257   UROBILINOGEN 0.2 05/24/2011 0235   NITRITE NEGATIVE 10/14/2016 1257   LEUKOCYTESUR NEGATIVE 10/14/2016 1257   LEUKOCYTESUR Negative 05/31/2014 1610     RADIOLOGY: Dg Chest 1 View  Result Date: 10/14/2016 CLINICAL DATA:  Altered mental status.  Weakness. EXAM: CHEST 1 VIEW COMPARISON:  03/07/2016 . FINDINGS: Cardiomegaly with normal pulmonary vascularity. No focal infiltrate. No pleural effusion or pneumothorax. Degenerative changes thoracic spine. IMPRESSION: Cardiomegaly. No overt congestive heart failure. No focal pulmonary infiltrate . Electronically Signed   By: Marcello Moores  Register   On: 10/14/2016 15:08   Ct Head Wo Contrast  Result Date: 10/14/2016 CLINICAL DATA:  Altered mental status.  Dementia. EXAM: CT HEAD  WITHOUT CONTRAST TECHNIQUE: Contiguous axial images were obtained from the base of the skull through the vertex without intravenous contrast. COMPARISON:  03/07/2016. FINDINGS: Brain: No evidence for acute infarction,  hemorrhage, mass lesion, hydrocephalus, or extra-axial fluid. Advanced atrophy. Chronic microvascular ischemic change. Vascular: No hyperdense vessel. Vascular calcification not unexpected for age. Skull: Normal. Negative for fracture or focal lesion. Sinuses/Orbits: No acute finding. Other: None.  Compared with priors, similar appearance. IMPRESSION: Negative exam. Electronically Signed   By: Staci Righter M.D.   On: 10/14/2016 14:50    EKG: Orders placed or performed during the hospital encounter of 10/14/16  . ED EKG  . ED EKG  . EKG 12-Lead  . EKG 12-Lead    IMPRESSION AND PLAN:  * Confusion and altered mental status   The episode was short lasting currently patient is back to her baseline   CT scan of the head is negative, we'll do MRI on the pain to rule out stroke.   She was carotid Doppler studies done 6 months ago.   Consult neurology for further help.  * Microcytic anemia and thrombocytosis   Send iron studies and LDH, call hematology consult.   Stool guaiac negative as per ER physician, no need for blood transfusion at this time.  * Hyperlipidemia   Continue atorvastatin.  All the records are reviewed and case discussed with ED provider. Management plans discussed with the patient, family and they are in agreement.  CODE STATUS: Full code Code Status History    Date Active Date Inactive Code Status Order ID Comments User Context   03/07/2016  2:20 PM 03/09/2016  5:35 PM Full Code [id number]  Lytle Butte, MD ED   02/22/2016  8:10 PM 02/24/2016  7:54 PM Full Code [id number]  Hillary Bow, MD ED   01/20/2016  7:47 PM 01/21/2016  4:40 PM Full Code [id number]  Gladstone Lighter, MD ED     Patient self-report power of attorney her brother and niece were present in the room.  TOTAL TIME TAKING CARE OF THIS PATIENT: 50 minutes.    Vaughan Basta M.D on 10/14/2016   Between 7am to 6pm - Pager - [phone]  After 6pm go to [email or web address] - password [email or web address] [facility]  Office  [phone]  CC: Primary care physician; No PCP Per Patient   Note: This dictation was prepared with Dragon dictation along with smaller phrase technology. Any transcriptional errors that result from this process are unintentional.

## 2016-10-14 NOTE — ED Triage Notes (Signed)
Pt from [facility] with reports from that pt has been more lethargic this am than normal, they stated that pt is usually a/o x 4 but today has had some confusion and just "not acting herself". Also staff reports that pt has had foul smelling urine. Pt denies pain. A/O x 3.

## 2016-10-14 NOTE — ED Provider Notes (Signed)
[facility] Emergency Department Provider Note  ____________________________________________   First MD Initiated Contact with Patient 10/14/16 1235     (approximate)  I have reviewed the triage vital signs and the nursing notes.   HISTORY  Chief Complaint Altered Mental Status   HPI Savannah Young is a 83 y.o. female with a history of COPD and dementia who is presenting to the emergency department today with altered mental status. She is here with a caretaker from her care home where she lives. Per the caretaker the patient is usually able to ambulate and feed herself. She requires assistance with dressing but is otherwise fairly independent. However, today the patient had difficulty waking up and has been significantly less active and energetic than normal. Foul-smelling urine was also noted. Caretaker says that no blood was noticed in the patient's stool.   Past Medical History:  Diagnosis Date  . COPD (chronic obstructive pulmonary disease) ([place])   . Dementia   . Diabetes mellitus ([place])   . Hypertension     Patient Active Problem List   Diagnosis Date Noted  . Encephalopathy acute 03/07/2016  . Pressure ulcer 02/23/2016  . Confusion 02/22/2016  . Agitation 01/20/2016    History reviewed. No pertinent surgical history.  Prior to Admission medications   Medication Sig Start Date End Date Taking? Authorizing Provider  acetaminophen (TYLENOL) 500 MG tablet Take 500 mg by mouth 3 (three) times daily as needed for mild pain.   Yes Historical Provider, MD  ALPRAZolam Duanne Moron) 0.5 MG tablet Take 0.5 mg by mouth 3 (three) times daily as needed. 07/28/16  Yes Historical Provider, MD  aspirin 325 MG EC tablet Take 1 tablet (325 mg total) by mouth daily. 02/24/16  Yes Theodoro Grist, MD  atorvastatin (LIPITOR) 20 MG tablet Take 1 tablet (20 mg total) by mouth daily. 02/24/16  Yes Theodoro Grist, MD  Cranberry 425 MG CAPS Take 425 mg by mouth daily.   Yes  Historical Provider, MD  diphenhydrAMINE (BENADRYL) 25 mg capsule Take 1 capsule (25 mg total) by mouth at bedtime as needed for itching. 03/09/16  Yes Fritzi Mandes, MD  fexofenadine (ALLEGRA) 180 MG tablet Take 180 mg by mouth daily.   Yes Historical Provider, MD  guaifenesin (ROBITUSSIN) 100 MG/5ML syrup Take 200 mg by mouth 3 (three) times daily as needed for cough.   Yes Historical Provider, MD  ibuprofen (ADVIL,MOTRIN) 800 MG tablet Take 800 mg by mouth 2 (two) times daily. 09/23/16  Yes Historical Provider, MD  Melatonin 10 MG TABS Take 10 mg by mouth at bedtime.   Yes Historical Provider, MD  memantine (NAMENDA XR) 28 MG CP24 24 hr capsule Take 28 mg by mouth at bedtime.   Yes Historical Provider, MD  feeding supplement, ENSURE ENLIVE, (ENSURE ENLIVE) LIQD Take 237 mLs by mouth 2 (two) times daily between meals. 03/09/16   Fritzi Mandes, MD    Allergies Patient has no known allergies.  Family History  Problem Relation Age of Onset  . Hypertension Other     Social History Social History  Substance Use Topics  . Smoking status: Never Smoker  . Smokeless tobacco: Not on file  . Alcohol use No    Review of Systems Level V caveat secondary to dementia.  ____________________________________________   PHYSICAL EXAM:  VITAL SIGNS: ED Triage Vitals  Enc Vitals Group     BP 10/14/16 1227 (!) 138/52     Pulse Rate 10/14/16 1227 83  Resp 10/14/16 1227 18     Temp 10/14/16 1227 98.6 F (37 C)     Temp Source 10/14/16 1227 Oral     SpO2 10/14/16 1227 100 %     Weight 10/14/16 1228 105 lb (47.6 kg)     Height 10/14/16 1228 5' (1.524 m)     Head Circumference --      Peak Flow --      Pain Score --      Pain Loc --      Pain Edu? --      Excl. in [place]? --     Constitutional: Alert and in no acute distress. Eyes: Conjunctivae are normal. PERRL. EOMI. Head: Atraumatic. Nose: No congestion/rhinnorhea. Mouth/Throat: Mucous membranes are moist.   Neck: No stridor.     Cardiovascular: Normal rate, regular rhythm. Grossly normal heart sounds.   Respiratory: Normal respiratory effort.  No retractions. Lungs CTAB. Gastrointestinal: Soft and nontender. No distention. . Grossly brown stool which is Hemoccult negative. Musculoskeletal: No lower extremity tenderness nor edema.  No joint effusions. Neurologic:  Normal speech and language. No gross focal neurologic deficits are appreciated.  Skin:  Skin is warm, dry and intact. No rash noted. Psychiatric: Mood and affect are normal. Speech and behavior are normal.  ____________________________________________   LABS (all labs ordered are listed, but only abnormal results are displayed)  Labs Reviewed  COMPREHENSIVE METABOLIC PANEL - Abnormal; Notable for the following:       Result Value   Calcium 8.6 (*)    Total Protein 6.2 (*)    Albumin 3.3 (*)    ALT 12 (*)    GFR calc non Af Amer 60 (*)    All other components within normal limits  CBC - Abnormal; Notable for the following:    Hemoglobin 7.2 (*)    HCT 23.9 (*)    MCV 61.6 (*)    MCH 18.7 (*)    MCHC 30.3 (*)    RDW 20.3 (*)    Platelets 1,301 (*)    All other components within normal limits  URINALYSIS, COMPLETE (UACMP) WITH MICROSCOPIC - Abnormal; Notable for the following:    Color, Urine YELLOW (*)    APPearance CLEAR (*)    All other components within normal limits  TROPONIN I  TYPE AND SCREEN   ____________________________________________  EKG  ED ECG REPORT I, Doran Stabler, the attending physician, personally viewed and interpreted this ECG.   Date: 10/14/2016  EKG Time: 1405  Rate: 75  Rhythm: normal sinus rhythm  Axis: Normal  Intervals:none  ST&T Change: No ST segment elevation or depression. No abnormal T-wave inversion.  ____________________________________________  RADIOLOGY  Head and chest x-ray as well as head CT at this time. ____________________________________________   PROCEDURES  Procedure(s)  performed:   Procedures  Critical Care performed:   ____________________________________________   INITIAL IMPRESSION / ASSESSMENT AND PLAN / ED COURSE  Pertinent labs & imaging results that were available during my care of the patient were reviewed by me and considered in my medical decision making (see chart for details).  ----------------------------------------- 2:26 PM on 10/14/2016 -----------------------------------------  Patient with what appears to be symptomatic anemia. Previous hemoglobin from July 2017 at 12.8. Heme-negative stool. Feel that this most likely is a saline issue given that the patient is also with what appears to be a chronic thrombocytosis. Patient to be admitted to the hospital. Signed out to Dr. Dede Query.  Patient as well as her caretaker were  informed of the plan and willing to comply.      ____________________________________________   FINAL CLINICAL IMPRESSION(S) / ED DIAGNOSES  Altered mental status. Symptomatic anemia.    NEW MEDICATIONS STARTED DURING THIS VISIT:  New Prescriptions   No medications on file     Note:  This document was prepared using Dragon voice recognition software and may include unintentional dictation errors.    Orbie Pyo, MD 10/14/16 [phone]

## 2016-10-14 NOTE — Progress Notes (Signed)
PHARMACIST - PHYSICIAN ORDER COMMUNICATION  CONCERNING: P&T Medication Policy on Herbal Medications  DESCRIPTION:  This patient's order for:  Cranberry caps  has been noted.  This product(s) is classified as an "herbal" or natural product. Due to a lack of definitive safety studies or FDA approval, nonstandard manufacturing practices, plus the potential risk of unknown drug-drug interactions while on inpatient medications, the Pharmacy and Therapeutics Committee does not permit the use of "herbal" or natural products of this type within [facility].   ACTION TAKEN: The pharmacy department is unable to verify this order at this time and your patient has been informed of this safety policy. Please reevaluate patient's clinical condition at discharge and address if the herbal or natural product(s) should be resumed at that time.

## 2016-10-14 NOTE — Progress Notes (Signed)
Family Meeting Note  Advance Directive:yes  Today a meeting took place with the brother and neice.  Patient is unable to participate due [id number] capacity dementia   The following clinical team members were present during this meeting:MD  The following were discussed:Patient's diagnosis: anemia, Altered mental status , Patient's progosis: Unable to determine and Goals for treatment: Continue present management  Additional follow-up to be provided: neurology consult.  Time spent during discussion:20 minutes  Kwesi Sangha, Rosalio Macadamia, MD

## 2016-10-15 DIAGNOSIS — J449 Chronic obstructive pulmonary disease, unspecified: Secondary | ICD-10-CM | POA: Diagnosis not present

## 2016-10-15 DIAGNOSIS — Z7982 Long term (current) use of aspirin: Secondary | ICD-10-CM | POA: Diagnosis not present

## 2016-10-15 DIAGNOSIS — D509 Iron deficiency anemia, unspecified: Secondary | ICD-10-CM | POA: Diagnosis not present

## 2016-10-15 DIAGNOSIS — I10 Essential (primary) hypertension: Secondary | ICD-10-CM | POA: Diagnosis not present

## 2016-10-15 DIAGNOSIS — F0390 Unspecified dementia without behavioral disturbance: Secondary | ICD-10-CM | POA: Diagnosis not present

## 2016-10-15 DIAGNOSIS — E119 Type 2 diabetes mellitus without complications: Secondary | ICD-10-CM

## 2016-10-15 DIAGNOSIS — D6959 Other secondary thrombocytopenia: Secondary | ICD-10-CM

## 2016-10-15 DIAGNOSIS — Z79899 Other long term (current) drug therapy: Secondary | ICD-10-CM | POA: Diagnosis not present

## 2016-10-15 DIAGNOSIS — R4182 Altered mental status, unspecified: Secondary | ICD-10-CM | POA: Diagnosis not present

## 2016-10-15 LAB — CBC
HEMATOCRIT: 23.2 % — AB (ref 35.0–47.0)
Hemoglobin: 6.9 g/dL — ABNORMAL LOW (ref 12.0–16.0)
MCH: 18.4 pg — AB (ref 26.0–34.0)
MCHC: 29.9 g/dL — ABNORMAL LOW (ref 32.0–36.0)
MCV: 61.5 fL — AB (ref 80.0–100.0)
Platelets: 1222 10*3/uL (ref 150–440)
RBC: 3.77 MIL/uL — AB (ref 3.80–5.20)
RDW: 19.7 % — ABNORMAL HIGH (ref 11.5–14.5)
WBC: 5.3 10*3/uL (ref 3.6–11.0)

## 2016-10-15 LAB — BASIC METABOLIC PANEL
Anion gap: 7 (ref 5–15)
BUN: 13 mg/dL (ref 6–20)
CHLORIDE: 110 mmol/L (ref 101–111)
CO2: 24 mmol/L (ref 22–32)
CREATININE: 0.78 mg/dL (ref 0.44–1.00)
Calcium: 8.6 mg/dL — ABNORMAL LOW (ref 8.9–10.3)
GFR calc non Af Amer: >60 mL/min (ref >60)
Glucose, Bld: 87 mg/dL (ref 65–99)
Potassium: 3.9 mmol/L (ref 3.5–5.1)
Sodium: 141 mmol/L (ref 135–145)

## 2016-10-15 LAB — VITAMIN B12: VITAMIN B 12: 795 pg/mL (ref 180–914)

## 2016-10-15 LAB — HEMOGLOBIN AND HEMATOCRIT, BLOOD
HEMATOCRIT: 27.7 % — AB (ref 35.0–47.0)
HEMOGLOBIN: 8.5 g/dL — AB (ref 12.0–16.0)

## 2016-10-15 LAB — FOLATE: FOLATE: 28 ng/mL (ref >5.9)

## 2016-10-15 LAB — PREPARE RBC (CROSSMATCH)

## 2016-10-15 LAB — ABO/RH: ABO/RH(D): O POS

## 2016-10-15 LAB — DAT, POLYSPECIFIC AHG (ARMC ONLY): Polyspecific AHG test: NEGATIVE

## 2016-10-15 MED ORDER — METOPROLOL TARTRATE 25 MG PO TABS
25.0000 mg | ORAL_TABLET | Freq: Two times a day (BID) | ORAL | Status: DC
  Administered 2016-10-15 (×2): 25 mg via ORAL
  Filled 2016-10-15 (×2): qty 1

## 2016-10-15 MED ORDER — ASPIRIN EC 81 MG PO TBEC
81.0000 mg | DELAYED_RELEASE_TABLET | Freq: Every day | ORAL | Status: DC
  Administered 2016-10-15: 10:00:00 81 mg via ORAL
  Filled 2016-10-15: qty 1

## 2016-10-15 MED ORDER — PANTOPRAZOLE SODIUM 40 MG PO TBEC
40.0000 mg | DELAYED_RELEASE_TABLET | Freq: Every day | ORAL | Status: DC
  Administered 2016-10-15: 16:00:00 40 mg via ORAL
  Filled 2016-10-15: qty 1

## 2016-10-15 MED ORDER — SODIUM CHLORIDE 0.9 % IV SOLN
Freq: Once | INTRAVENOUS | Status: AC
  Administered 2016-10-15: 14:00:00 via INTRAVENOUS

## 2016-10-15 NOTE — Progress Notes (Signed)
PT Cancellation Note  Patient Details Name: Savannah Young MRN: [id number] DOB: June 30, 1932   Cancelled Treatment:    Reason Eval/Treat Not Completed: Medical issues which prohibited therapy.  Will check later to see if pt is more medically appropriate with hgb and platelets.   Ramond Dial 10/15/2016, 11:25 AM   Mee Hives, PT MS Acute Rehab Dept. Number: [place] and [place]

## 2016-10-15 NOTE — Care Management Obs Status (Signed)
[facility] NOTIFICATION   Patient Details  Name: Savannah Young MRN: [id number] Date of Birth: 06-01-1932   Medicare Observation Status Notification Given:  Yes [facility] letter given but patient unable to sign. )    Lelani Garnett A, RN 10/15/2016, 4:34 PM

## 2016-10-15 NOTE — Consult Note (Signed)
[facility] Face-to-Face Psychiatry Consult   Reason for Consult:  Hallucination, confusion Referring Physician:  Dr. Ether Griffins Patient Identification: Savannah Young MRN:  [id number] Principal Diagnosis: Confusion Diagnosis:  R/o Delirium, Dementia with behavioral disturbance Patient Active Problem List   Diagnosis Date Noted  . Anemia [D64.9] 10/14/2016  . Thrombocythemia ([place]) [D47.3] 10/14/2016  . TIA (transient ischemic attack) [G45.9] 10/14/2016  . Encephalopathy acute [G93.40] 03/07/2016  . Pressure ulcer [L89.90] 02/23/2016  . Confusion [R41.0] 02/22/2016  . Agitation [R45.1] 01/20/2016    Total Time spent with patient: 1 hour  Subjective:   Savannah Young is a 83 y.o. female patient admitted with medical issues, anemia, confusion, TIA  HPI:   And c/c-  Savannah Young is 83 y/o f with h/o dementia and  medical issues, anemia, confusion, consult called for confusion and hallucination, irritability. Savannah Young presents as irritable, forgetful, confused, poor historian. Savannah Young has blunted affect, do not speak spontaneously, irritable when asked. Savannah Young alert, oriented to familiar people and hospital. Denies SI/Hi. Does not respond to questions of hallucinations but apperas blank and stare at times, possibly responding to internal stimuli.  Savannah Young's sister reports that Savannah Young h/o dementia x about 6 yrs, some irritability is her baseline, family reports that  Savannah Young had remote h/o psych hospitalization in her 13's, details unknown..   Past Psychiatric History: h/o dementia x about 6 yrs , sister reports Savannah Young had remote h/o psych hospitalization in her 6's, details unknown . Savannah Young on xanax, Namenda .   Social hx-  lives in facility, Brother is  guardian, Savannah Young is widow, has 2 adopted children,   Risk to Self: -  possible due to confusion and cognitive impairemnt Risk to Others:  yes  Past Medical History:  Past Medical History:  Diagnosis Date  . COPD (chronic obstructive pulmonary disease) ([place])   . Dementia   . Diabetes  mellitus ([place])   . Hypertension    History reviewed. No pertinent surgical history. Family History:  Family History  Problem Relation Age of Onset  . Hypertension Other    Family Psychiatric  History: none Social History:  History  Alcohol Use No     History  Drug use: Unknown    Social History   Social History  . Marital status: Widowed    Spouse name: N/A  . Number of children: N/A  . Years of education: N/A   Social History Main Topics  . Smoking status: Never Smoker  . Smokeless tobacco: None  . Alcohol use No  . Drug use: Unknown  . Sexual activity: Not Asked   Other Topics Concern  . None   Social History Narrative   From [facility] assisted [facility] facility   Additional Social History:    Allergies:  No Known Allergies  Labs:  Results for orders placed or performed during the hospital encounter of 10/14/16 (from the past 48 hour(s))  Comprehensive metabolic panel     Status: Abnormal   Collection Time: 10/14/16 12:36 PM  Result Value Ref Range   Sodium 141 135 - 145 mmol/L   Potassium 4.0 3.5 - 5.1 mmol/L   Chloride 111 101 - 111 mmol/L   CO2 24 22 - 32 mmol/L   Glucose, Bld 93 65 - 99 mg/dL   BUN 14 6 - 20 mg/dL   Creatinine, Ser 0.88 0.44 - 1.00 mg/dL   Calcium 8.6 (L) 8.9 - 10.3 mg/dL   Total Protein 6.2 (L) 6.5 - 8.1 g/dL   Albumin 3.3 (L) 3.5 -  5.0 g/dL   AST 20 15 - 41 U/L   ALT 12 (L) 14 - 54 U/L   Alkaline Phosphatase 111 38 - 126 U/L   Total Bilirubin 0.3 0.3 - 1.2 mg/dL   GFR calc non Af Amer 60 (L) >60 mL/min   GFR calc Af Amer >60 >60 mL/min    Comment: (NOTE) The eGFR has been calculated using the CKD EPI equation. This calculation has not been validated in all clinical situations. eGFR's persistently <60 mL/min signify possible Chronic Kidney Disease.    Anion gap 6 5 - 15  CBC     Status: Abnormal   Collection Time: 10/14/16 12:36 PM  Result Value Ref Range   WBC 5.2 3.6 - 11.0 K/uL   RBC 3.87 3.80 - 5.20 MIL/uL    Hemoglobin 7.2 (L) 12.0 - 16.0 g/dL   HCT 23.9 (L) 35.0 - 47.0 %   MCV 61.6 (L) 80.0 - 100.0 fL   MCH 18.7 (L) 26.0 - 34.0 pg   MCHC 30.3 (L) 32.0 - 36.0 g/dL   RDW 20.3 (H) 11.5 - 14.5 %   Platelets 1,301 (HH) 150 - 440 K/uL    Comment: CRITICAL RESULT CALLED TO, READ BACK BY AND VERIFIED WITH:  LAURA CATES AT 1311 10/14/16 SDR   Troponin I     Status: None   Collection Time: 10/14/16 12:36 PM  Result Value Ref Range   Troponin I <0.03 <0.03 ng/mL  TSH     Status: None   Collection Time: 10/14/16 12:36 PM  Result Value Ref Range   TSH 1.329 0.350 - 4.500 uIU/mL    Comment: Performed by a 3rd Generation assay with a functional sensitivity of <=0.01 uIU/mL.  Iron and TIBC     Status: Abnormal   Collection Time: 10/14/16 12:36 PM  Result Value Ref Range   Iron 15 (L) 28 - 170 ug/dL   TIBC 414 250 - 450 ug/dL   Saturation Ratios 4 (L) 10.4 - 31.8 %   UIBC 399 ug/dL  Lactate dehydrogenase     Status: Abnormal   Collection Time: 10/14/16 12:36 PM  Result Value Ref Range   LDH 223 (H) 98 - 192 U/L  Urinalysis, Complete w Microscopic     Status: Abnormal   Collection Time: 10/14/16 12:57 PM  Result Value Ref Range   Color, Urine YELLOW (A) YELLOW   APPearance CLEAR (A) CLEAR   Specific Gravity, Urine 1.012 1.005 - 1.030   pH 6.0 5.0 - 8.0   Glucose, UA NEGATIVE NEGATIVE mg/dL   Hgb urine dipstick NEGATIVE NEGATIVE   Bilirubin Urine NEGATIVE NEGATIVE   Ketones, ur NEGATIVE NEGATIVE mg/dL   Protein, ur NEGATIVE NEGATIVE mg/dL   Nitrite NEGATIVE NEGATIVE   Leukocytes, UA NEGATIVE NEGATIVE   RBC / HPF 0-5 0 - 5 RBC/hpf   WBC, UA 0-5 0 - 5 WBC/hpf   Bacteria, UA NONE SEEN NONE SEEN   Squamous Epithelial / LPF NONE SEEN NONE SEEN  Type and screen [facility]     Status: None (Preliminary result)   Collection Time: 10/14/16  2:14 PM  Result Value Ref Range   ISSUE DATE / TIME [id number]    Blood Product Unit Number [id number]    PRODUCT CODE [id number]     Unit Type and Rh 5100    Blood Product Expiration Date 706237628315   MRSA PCR Screening     Status: None   Collection Time: 10/14/16  5:17 PM  Result Value Ref Range   MRSA by PCR NEGATIVE NEGATIVE    Comment:        The GeneXpert MRSA Assay (FDA approved for NASAL specimens only), is one component of a comprehensive MRSA colonization surveillance program. It is not intended to diagnose MRSA infection nor to guide or monitor treatment for MRSA infections.   Basic metabolic panel     Status: Abnormal   Collection Time: 10/15/16  5:38 AM  Result Value Ref Range   Sodium 141 135 - 145 mmol/L   Potassium 3.9 3.5 - 5.1 mmol/L   Chloride 110 101 - 111 mmol/L   CO2 24 22 - 32 mmol/L   Glucose, Bld 87 65 - 99 mg/dL   BUN 13 6 - 20 mg/dL   Creatinine, Ser 0.78 0.44 - 1.00 mg/dL   Calcium 8.6 (L) 8.9 - 10.3 mg/dL   GFR calc non Af Amer >60 >60 mL/min   GFR calc Af Amer >60 >60 mL/min    Comment: (NOTE) The eGFR has been calculated using the CKD EPI equation. This calculation has not been validated in all clinical situations. eGFR's persistently <60 mL/min signify possible Chronic Kidney Disease.    Anion gap 7 5 - 15  CBC     Status: Abnormal   Collection Time: 10/15/16  5:38 AM  Result Value Ref Range   WBC 5.3 3.6 - 11.0 K/uL   RBC 3.77 (L) 3.80 - 5.20 MIL/uL   Hemoglobin 6.9 (L) 12.0 - 16.0 g/dL   HCT 23.2 (L) 35.0 - 47.0 %   MCV 61.5 (L) 80.0 - 100.0 fL   MCH 18.4 (L) 26.0 - 34.0 pg   MCHC 29.9 (L) 32.0 - 36.0 g/dL   RDW 19.7 (H) 11.5 - 14.5 %   Platelets 1,222 (HH) 150 - 440 K/uL    Comment: RESULT REPEATED AND VERIFIED CRITICAL VALUE NOTED.  VALUE IS CONSISTENT WITH PREVIOUSLY REPORTED AND CALLED VALUE.   ABO/Rh     Status: None   Collection Time: 10/15/16  5:38 AM  Result Value Ref Range   ABO/RH(D) O POS   Prepare RBC     Status: None   Collection Time: 10/15/16  9:30 AM  Result Value Ref Range   Order Confirmation ORDER PROCESSED BY BLOOD BANK   DAT,  polyspecific, AHG ([facility] only)     Status: None   Collection Time: 10/15/16 10:00 AM  Result Value Ref Range   Polyspecific AHG test NEG   Folate     Status: None   Collection Time: 10/15/16 10:00 AM  Result Value Ref Range   Folate 28.0 >5.9 ng/mL    Current Facility-Administered Medications  Medication Dose Route Frequency Provider Last Rate Last Dose  . acetaminophen (TYLENOL) tablet 500 mg  500 mg Oral TID PRN Vaughan Basta, MD      . ALPRAZolam Duanne Moron) tablet 0.25 mg  0.25 mg Oral TID PRN Vaughan Basta, MD      . atorvastatin (LIPITOR) tablet 20 mg  20 mg Oral Daily Vaughan Basta, MD   20 mg at 10/15/16 0944  . docusate sodium (COLACE) capsule 100 mg  100 mg Oral BID PRN Vaughan Basta, MD      . feeding supplement (ENSURE ENLIVE) (ENSURE ENLIVE) liquid 237 mL  237 mL Oral BID BM Vaughan Basta, MD   237 mL at 10/15/16 1453  . guaifenesin (ROBITUSSIN) 100 MG/5ML syrup 200 mg  200 mg Oral TID PRN Vaughan Basta, MD      .  heparin injection 5,000 Units  5,000 Units Subcutaneous Q8H Vaughan Basta, MD   5,000 Units at 10/14/16 2206  . Melatonin TABS 10 mg  10 mg Oral QHS Vaughan Basta, MD   10 mg at 10/14/16 2206  . memantine (NAMENDA XR) 24 hr capsule 28 mg  28 mg Oral QHS Vaughan Basta, MD   28 mg at 10/14/16 2206  . metoprolol tartrate (LOPRESSOR) tablet 25 mg  25 mg Oral BID Theodoro Grist, MD   25 mg at 10/15/16 1606  . pantoprazole (PROTONIX) EC tablet 40 mg  40 mg Oral Daily Theodoro Grist, MD   40 mg at 10/15/16 1606    Musculoskeletal: Strength & Muscle Tone: decreased Gait & Station: unsteady Patient leans:   Psychiatric Specialty Exam: Physical Exam  Nursing note and vitals reviewed.   ROS  Blood pressure 128/66, pulse 90, temperature 98 F (36.7 C), temperature source Oral, resp. rate 18, height '5\' 1"'  (1.549 m), weight 46.6 kg (102 lb 12.8 oz), last menstrual period 01/20/2016, SpO2 100 %.Body mass  index is 19.42 kg/m.  General Appearance: Fairly Groomed and Guarded  Eye Contact:  Poor  Speech:  Slow, minimal  Volume:  low  Mood:  Irritable  Affect:  Irritable, anxious  Thought Process:  Disorganized  Orientation:  To hospital and familiar people  Thought Content:  guarded  Suicidal Thoughts:  No  Homicidal Thoughts:  No  Memory:  Global memory impairment noted  Judgement:  Poor  Insight:  Lacking  Psychomotor Activity:  Decreased  Concentration:  poor  Recall:  Did not cooperate, appears poor  Fund of Knowledge:  Did not cooperate,  Language:  limited  Akathisia:  no  Handed:    AIMS (if indicated):     Assets:    ADL's:  poor  Cognition:  impaired  Sleep:        Treatment Plan Summary/Recommendation; Savannah Young with dementia currently with confusion, mood lability, and irritability with possible hallucination/responding to internal stimuli. Most likely Savannah Young is delirious, would also consider dementia with behavioral disturbance.  1. Seroquel 25 mg po qhs for irritability/mood lability, may increase dose if needed and tolerated.  2. Decrease xanax as 0.25 bid  - as it may worsen cognitive impairment. Cont Namenda. 3. Savannah Young does not meet inpatient criteria [facility] psych unit.   Disposition: Patient does not meet criteria for psychiatric inpatient admission.  Lenward Chancellor, MD 10/15/2016 4:34 PM

## 2016-10-15 NOTE — Progress Notes (Signed)
83 y.o. female with a known history of COPD, dementia, diabetes, hypertension- lives at assisted living place because of dementia, today they called her niece that she is not very well responsive.  Though out the night mental status has improved and pt is following command but still clear signs of confusion.     Neurological Examination   Mental Status: Alert to name and place.  Cranial Nerves: II: Discs flat bilaterally; Visual fields grossly normal, pupils equal, round, reactive to light and accommodation III,IV, VI: ptosis not present, extra-ocular motions intact bilaterally V,VII: smile symmetric, facial light touch sensation normal bilaterally VIII: hearing normal bilaterally IX,X: gag reflex present XI: bilateral shoulder shrug XII: midline tongue extension Motor: Right : Upper extremity   5/5    Left:     Upper extremity   5/5  Lower extremity   5/5     Lower extremity   5/5 Tone and bulk:normal tone throughout; no atrophy noted Sensory: Pinprick and light touch intact throughout, bilaterally Deep Tendon Reflexes: 1+ and symmetric throughout Plantars: Right: downgoing   Left: downgoing Cerebellar: normal finger-to-nose, normal rapid alternating movements and normal heel-to-shin test   Mentation improved.  CTH no acute abnormalities Microcytic anemia with low Hb.   I would not pursue any further imaging from neuro stand point. Do not think that she needs MRI.

## 2016-10-15 NOTE — Progress Notes (Signed)
[place] [facility] [place] NAME: Savannah Young    MR#:  [id number]  DATE OF BIRTH:  Jul 03, 1932  SUBJECTIVE:  CHIEF COMPLAINT:   Chief Complaint  Patient presents with  . Altered Mental Status  The patient is a 83 year old female with past medical history significant for history of COPD, dementia, diabetes, hypertension, who lives in assisted living facility, who presents to the hospital with complaints of poor responsiveness, somnolence. On arrival to emergency room, patient was back to her baseline. She was noted to have significant drop in her hemoglobin level from 12.8 in July 2017 to 6.9 during this admission. She was also noted to have markedly elevated platelet count. Hemoccult of stool was negative. In emergency room. Patient herself is not able to provide no history due to dementia  Review of Systems  Unable to perform ROS: Dementia    VITAL SIGNS: Blood pressure 128/66, pulse 90, temperature 98 F (36.7 C), temperature source Oral, resp. rate 18, height 5\' 1"  (1.549 m), weight 46.6 kg (102 lb 12.8 oz), last menstrual period 01/20/2016, SpO2 100 %.  PHYSICAL EXAMINATION:   GENERAL:  83 y.o.-year-old patient lying in the bed with no acute distress, refuses to talk, turns herself away from it examiner, sometimes pushes hand away.  EYES: Pupils equal, round, reactive to light and accommodation. No scleral icterus. Extraocular muscles intact.  HEENT: Head atraumatic, normocephalic. Oropharynx and nasopharynx clear.  NECK:  Supple, no jugular venous distention. No thyroid enlargement, no tenderness.  LUNGS: Normal breath sounds bilaterally, no wheezing, rales,rhonchi or crepitation. No use of accessory muscles of respiration.  CARDIOVASCULAR: S1, S2 normal. No murmurs, rubs, or gallops.  ABDOMEN: Soft, nontender, nondistended. Bowel sounds present. No organomegaly or mass.  EXTREMITIES: No pedal edema, cyanosis, or clubbing.  NEUROLOGIC:  Cranial nerves II through XII are intact. Muscle strength 5/5 in all extremities. Sensation intact. Gait not checked.  PSYCHIATRIC: The patient is alert and oriented x 3.  SKIN: No obvious rash, lesion, or ulcer.   ORDERS/RESULTS REVIEWED:   CBC  Recent Labs Lab 10/14/16 1236 10/15/16 0538  WBC 5.2 5.3  HGB 7.2* 6.9*  HCT 23.9* 23.2*  PLT 1,301* 1,222*  MCV 61.6* 61.5*  MCH 18.7* 18.4*  MCHC 30.3* 29.9*  RDW 20.3* 19.7*   ------------------------------------------------------------------------------------------------------------------  Chemistries   Recent Labs Lab 10/14/16 1236 10/15/16 0538  NA 141 141  K 4.0 3.9  CL 111 110  CO2 24 24  GLUCOSE 93 87  BUN 14 13  CREATININE 0.88 0.78  CALCIUM 8.6* 8.6*  AST 20  --   ALT 12*  --   ALKPHOS 111  --   BILITOT 0.3  --    ------------------------------------------------------------------------------------------------------------------ estimated creatinine clearance is 39.9 mL/min (by C-G formula based on SCr of 0.78 mg/dL). ------------------------------------------------------------------------------------------------------------------  Recent Labs  10/14/16 1236  TSH 1.329    Cardiac Enzymes  Recent Labs Lab 10/14/16 1236  TROPONINI <0.03   ------------------------------------------------------------------------------------------------------------------ Invalid input(s): POCBNP ---------------------------------------------------------------------------------------------------------------  RADIOLOGY: Dg Chest 1 View  Result Date: 10/14/2016 CLINICAL DATA:  Altered mental status.  Weakness. EXAM: CHEST 1 VIEW COMPARISON:  03/07/2016 . FINDINGS: Cardiomegaly with normal pulmonary vascularity. No focal infiltrate. No pleural effusion or pneumothorax. Degenerative changes thoracic spine. IMPRESSION: Cardiomegaly. No overt congestive heart failure. No focal pulmonary infiltrate . Electronically Signed   By:  Marcello Moores  Register   On: 10/14/2016 15:08   Ct Head Wo Contrast  Result Date: 10/14/2016 CLINICAL DATA:  Altered mental  status.  Dementia. EXAM: CT HEAD WITHOUT CONTRAST TECHNIQUE: Contiguous axial images were obtained from the base of the skull through the vertex without intravenous contrast. COMPARISON:  03/07/2016. FINDINGS: Brain: No evidence for acute infarction, hemorrhage, mass lesion, hydrocephalus, or extra-axial fluid. Advanced atrophy. Chronic microvascular ischemic change. Vascular: No hyperdense vessel. Vascular calcification not unexpected for age. Skull: Normal. Negative for fracture or focal lesion. Sinuses/Orbits: No acute finding. Other: None.  Compared with priors, similar appearance. IMPRESSION: Negative exam. Electronically Signed   By: Staci Righter M.D.   On: 10/14/2016 14:50    EKG:  Orders placed or performed during the hospital encounter of 10/14/16  . ED EKG  . ED EKG  . EKG 12-Lead  . EKG 12-Lead    ASSESSMENT AND PLAN:  Principal Problem:   Confusion Active Problems:   Anemia   Thrombocythemia (HCC)   TIA (transient ischemic attack)  #1. Altered mental status, likely due to dementia, the psychiatrist involved for further recommendations, since patient is hallucinating, per patient's family. CT of head was unremarkable, neurology consultation was requested by admitting physician, discontinue MRI of the brain order.  #2. Anemia, seems to be iron deficiency anemia with low saturation ratio, transfuse patient with one unit of packed red blood cells, follow hemoglobin level after transfusion, get Hemoccult, gastrointestinal GI consultation was requested, likely EGD, as patient was on aspirin and Advil, concerning for PUD , we may not be able to get colonoscopy done since patient may not be able to drink enough of medications to clean colon. Initiate Protonix #3. Thrombocytosis, likely malignant, get oncologist involved for  Recommendations #4. SVT, with a rate of 176,  initiate metoprolol Management plans discussed with the patient, family and they are in agreement.   DRUG ALLERGIES: No Known Allergies  CODE STATUS:     Code Status Orders        Start     Ordered   10/14/16 1647  Full code  Continuous     10/14/16 1646    Code Status History    Date Active Date Inactive Code Status Order ID Comments User Context   03/07/2016  2:20 PM 03/09/2016  5:35 PM Full Code [id number]  Lytle Butte, MD ED   02/22/2016  8:10 PM 02/24/2016  7:54 PM Full Code [id number]  Hillary Bow, MD ED   01/20/2016  7:47 PM 01/21/2016  4:40 PM Full Code [id number]  Gladstone Lighter, MD ED      TOTAL TIME TAKING CARE OF THIS PATIENT: 40 minutes.    Theodoro Grist M.D on 10/15/2016 at 3:05 PM  Between 7am to 6pm - Pager - [phone]  After 6pm go to [email or web address] - password [email or web address] [facility]  [facility]  Office  [phone]  CC: Primary care physician; No PCP Per Patient

## 2016-10-15 NOTE — Consult Note (Signed)
Consultation  Referring Provider:      Primary Care Physician:  No PCP Per Patient Primary Gastroenterologist:  San Jetty MD       Reason for Consultation:     Anemia     Impression / Plan:   Iron deficient anemia: Possibly secondary to GI blood loss although heme negative stool. Needs EGD first. Given dementia, not someone who can tolerate the bowel prep for colonoscopy. Stop ASA and ibuprofen.           HPI:   Savannah Young is a 83 y.o. female  with multiple medical problems including dementia who was admitted through the emergency room with decreasing responsiveness. She was also found to have significant iron deficiency anemia. HGB 6.9 today. Plt count 1,222 On ASA and ibuprofen. Heme negative stool.Transfusion 1 UPRBCs ordered for today.  Hematology consultation note reports thrombocytosis most likely secondary to IDA. Past Medical History:  Diagnosis Date  . COPD (chronic obstructive pulmonary disease) ([place])   . Dementia   . Diabetes mellitus ([place])   . Hypertension     History reviewed. No pertinent surgical history.  Family History  Problem Relation Age of Onset  . Hypertension Other       Social History  Substance Use Topics  . Smoking status: Never Smoker  . Smokeless tobacco: Not on file  . Alcohol use No    Prior to Admission medications   Medication Sig Start Date End Date Taking? Authorizing Provider  acetaminophen (TYLENOL) 500 MG tablet Take 500 mg by mouth 3 (three) times daily as needed for mild pain.   Yes Historical Provider, MD  ALPRAZolam Duanne Moron) 0.5 MG tablet Take 0.5 mg by mouth 3 (three) times daily as needed. 07/28/16  Yes Historical Provider, MD  aspirin 325 MG EC tablet Take 1 tablet (325 mg total) by mouth daily. 02/24/16  Yes Theodoro Grist, MD  atorvastatin (LIPITOR) 20 MG tablet Take 1 tablet (20 mg total) by mouth daily. 02/24/16  Yes Theodoro Grist, MD  Cranberry 425 MG CAPS Take 425 mg by mouth daily.   Yes Historical Provider, MD   diphenhydrAMINE (BENADRYL) 25 mg capsule Take 1 capsule (25 mg total) by mouth at bedtime as needed for itching. 03/09/16  Yes Fritzi Mandes, MD  fexofenadine (ALLEGRA) 180 MG tablet Take 180 mg by mouth daily.   Yes Historical Provider, MD  guaifenesin (ROBITUSSIN) 100 MG/5ML syrup Take 200 mg by mouth 3 (three) times daily as needed for cough.   Yes Historical Provider, MD  ibuprofen (ADVIL,MOTRIN) 800 MG tablet Take 800 mg by mouth 2 (two) times daily. 09/23/16  Yes Historical Provider, MD  Melatonin 10 MG TABS Take 10 mg by mouth at bedtime.   Yes Historical Provider, MD  memantine (NAMENDA XR) 28 MG CP24 24 hr capsule Take 28 mg by mouth at bedtime.   Yes Historical Provider, MD  feeding supplement, ENSURE ENLIVE, (ENSURE ENLIVE) LIQD Take 237 mLs by mouth 2 (two) times daily between meals. 03/09/16   Fritzi Mandes, MD    Current Facility-Administered Medications  Medication Dose Route Frequency Provider Last Rate Last Dose  . 0.9 %  sodium chloride infusion   Intravenous Once Theodoro Grist, MD      . acetaminophen (TYLENOL) tablet 500 mg  500 mg Oral TID PRN Vaughan Basta, MD      . ALPRAZolam Duanne Moron) tablet 0.25 mg  0.25 mg Oral TID PRN Vaughan Basta, MD      . aspirin EC tablet 81  mg  81 mg Oral Daily Theodoro Grist, MD   81 mg at 10/15/16 0944  . atorvastatin (LIPITOR) tablet 20 mg  20 mg Oral Daily Vaughan Basta, MD   20 mg at 10/15/16 0944  . docusate sodium (COLACE) capsule 100 mg  100 mg Oral BID PRN Vaughan Basta, MD      . feeding supplement (ENSURE ENLIVE) (ENSURE ENLIVE) liquid 237 mL  237 mL Oral BID BM Vaughan Basta, MD   237 mL at 10/15/16 0944  . guaifenesin (ROBITUSSIN) 100 MG/5ML syrup 200 mg  200 mg Oral TID PRN Vaughan Basta, MD      . heparin injection 5,000 Units  5,000 Units Subcutaneous Q8H Vaughan Basta, MD   5,000 Units at 10/14/16 2206  . ibuprofen (ADVIL,MOTRIN) tablet 800 mg  800 mg Oral BID PRN Vaughan Basta, MD      . Melatonin TABS 10 mg  10 mg Oral QHS Vaughan Basta, MD   10 mg at 10/14/16 2206  . memantine (NAMENDA XR) 24 hr capsule 28 mg  28 mg Oral QHS Vaughan Basta, MD   28 mg at 10/14/16 2206    Allergies as of 10/14/2016  . (No Known Allergies)     Review of Systems:    Unable to obtain secondary to dementia.      Physical Exam:  Vital signs in last 24 hours: Temp:  [97.7 F (36.5 C)-98.7 F (37.1 C)] 97.7 F (36.5 C) (02/17 0450) Pulse Rate:  [71-89] 71 (02/17 0450) Resp:  [18-20] 19 (02/17 0450) BP: (103-131)/(44-58) 103/47 (02/17 0450) SpO2:  [98 %-100 %] 98 % (02/17 0450) Weight:  [46.6 kg (102 lb 12.8 oz)] 46.6 kg (102 lb 12.8 oz) (02/16 1655) Last BM Date:  (unknown)  General:  Well-developed, well-nourished and in no acute distress Eyes:  anicteric. ENT:   Mouth and posterior pharynx free of lesions.  Neck:   supple w/o thyromegaly or mass.  Lungs: Clear to auscultation bilaterally. Heart:  S1S2, no rubs, murmurs, gallops. Abdomen:  soft, non-tender, no hepatosplenomegaly, hernia, or mass and BS+.  Rectal: Lymph:  no cervical or supraclavicular adenopathy. Extremities:   no edema Skin   no rash. Neuro:  A&O x 3.  Psych:  appropriate mood and  Affect.   Data Reviewed:   LAB RESULTS:  Recent Labs  10/14/16 1236 10/15/16 0538  WBC 5.2 5.3  HGB 7.2* 6.9*  HCT 23.9* 23.2*  PLT 1,301* 1,222*   BMET  Recent Labs  10/14/16 1236 10/15/16 0538  NA 141 141  K 4.0 3.9  CL 111 110  CO2 24 24  GLUCOSE 93 87  BUN 14 13  CREATININE 0.88 0.78  CALCIUM 8.6* 8.6*   LFT  Recent Labs  10/14/16 1236  PROT 6.2*  ALBUMIN 3.3*  AST 20  ALT 12*  ALKPHOS 111  BILITOT 0.3   PT/INR No results for input(s): LABPROT, INR in the last 72 hours.  STUDIES: Dg Chest 1 View  Result Date: 10/14/2016 CLINICAL DATA:  Altered mental status.  Weakness. EXAM: CHEST 1 VIEW COMPARISON:  03/07/2016 . FINDINGS: Cardiomegaly with normal  pulmonary vascularity. No focal infiltrate. No pleural effusion or pneumothorax. Degenerative changes thoracic spine. IMPRESSION: Cardiomegaly. No overt congestive heart failure. No focal pulmonary infiltrate . Electronically Signed   By: Marcello Moores  Register   On: 10/14/2016 15:08   Ct Head Wo Contrast  Result Date: 10/14/2016 CLINICAL DATA:  Altered mental status.  Dementia. EXAM: CT HEAD WITHOUT CONTRAST TECHNIQUE: Contiguous  axial images were obtained from the base of the skull through the vertex without intravenous contrast. COMPARISON:  03/07/2016. FINDINGS: Brain: No evidence for acute infarction, hemorrhage, mass lesion, hydrocephalus, or extra-axial fluid. Advanced atrophy. Chronic microvascular ischemic change. Vascular: No hyperdense vessel. Vascular calcification not unexpected for age. Skull: Normal. Negative for fracture or focal lesion. Sinuses/Orbits: No acute finding. Other: None.  Compared with priors, similar appearance. IMPRESSION: Negative exam. Electronically Signed   By: Staci Righter M.D.   On: 10/14/2016 14:50     PREVIOUS ENDOSCOPIES:                Thanks   LOS: 0 days   San Jetty MD @  10/15/2016, 1:25 PM

## 2016-10-15 NOTE — Consult Note (Signed)
[place]  [phone]) [phone] [phone]) [phone]  ID: Neil Crouch OB: 1932-01-27  MR#: [id number]  FXT#:[id number]  Patient Care Team: No Pcp Per Patient as PCP - General (General Practice)  CHIEF COMPLAINT: Severe iron deficiency anemia, thrombocytosis.  INTERVAL HISTORY: Patient is an 83 year old female with multiple medical problems including dementia who was admitted through the emergency room with decreasing responsiveness. She was also found to have significant iron deficiency anemia.  She has baseline dementia and is confused, but by report she was difficult to arouse yesterday morning. She appears back to her baseline this morning which is alert although confused. Review of systems is difficult given her underlying dementia. No family member at bedside.   REVIEW OF SYSTEMS:   Review of Systems  Unable to perform ROS: Dementia    As per HPI. Otherwise, a complete review of systems is negative.  PAST MEDICAL HISTORY: Past Medical History:  Diagnosis Date  . COPD (chronic obstructive pulmonary disease) ([place])   . Dementia   . Diabetes mellitus ([place])   . Hypertension     PAST SURGICAL HISTORY: History reviewed. No pertinent surgical history.  FAMILY HISTORY: Family History  Problem Relation Age of Onset  . Hypertension Other     ADVANCED DIRECTIVES (Y/N):  '@ADVDIR' @  HEALTH MAINTENANCE: Social History  Substance Use Topics  . Smoking status: Never Smoker  . Smokeless tobacco: Not on file  . Alcohol use No     Colonoscopy:  PAP:  Bone density:  Lipid panel:  No Known Allergies  Current Facility-Administered Medications  Medication Dose Route Frequency Provider Last Rate Last Dose  . 0.9 %  sodium chloride infusion   Intravenous Once Theodoro Grist, MD      . acetaminophen (TYLENOL) tablet 500 mg  500 mg Oral TID PRN Vaughan Basta, MD      . ALPRAZolam Duanne Moron) tablet 0.25 mg  0.25 mg Oral TID PRN Vaughan Basta, MD       . aspirin EC tablet 81 mg  81 mg Oral Daily Theodoro Grist, MD   81 mg at 10/15/16 0944  . atorvastatin (LIPITOR) tablet 20 mg  20 mg Oral Daily Vaughan Basta, MD   20 mg at 10/15/16 0944  . docusate sodium (COLACE) capsule 100 mg  100 mg Oral BID PRN Vaughan Basta, MD      . feeding supplement (ENSURE ENLIVE) (ENSURE ENLIVE) liquid 237 mL  237 mL Oral BID BM Vaughan Basta, MD   237 mL at 10/15/16 0944  . guaifenesin (ROBITUSSIN) 100 MG/5ML syrup 200 mg  200 mg Oral TID PRN Vaughan Basta, MD      . heparin injection 5,000 Units  5,000 Units Subcutaneous Q8H Vaughan Basta, MD   5,000 Units at 10/14/16 2206  . ibuprofen (ADVIL,MOTRIN) tablet 800 mg  800 mg Oral BID PRN Vaughan Basta, MD      . Melatonin TABS 10 mg  10 mg Oral QHS Vaughan Basta, MD   10 mg at 10/14/16 2206  . memantine (NAMENDA XR) 24 hr capsule 28 mg  28 mg Oral QHS Vaughan Basta, MD   28 mg at 10/14/16 2206    OBJECTIVE: Vitals:   10/14/16 2015 10/15/16 0450  BP: (!) 111/44 (!) 103/47  Pulse: 88 71  Resp: 20 19  Temp: 98.4 F (36.9 C) 97.7 F (36.5 C)     Body mass index is 19.42 kg/m.    ECOG FS:2 - Symptomatic, <50% confined to bed  General: Well-developed, well-nourished,  no acute distress. Eyes: Pink conjunctiva, anicteric sclera. HEENT: Normocephalic, moist mucous membranes, clear oropharnyx. Lungs: Clear to auscultation bilaterally. Heart: Regular rate and rhythm. No rubs, murmurs, or gallops. Abdomen: Soft, nontender, nondistended. No organomegaly noted, normoactive bowel sounds. Musculoskeletal: No edema, cyanosis, or clubbing. Neuro: Confused.  Skin: No rashes or petechiae noted. Psych: Normal affect. Lymphatics: No cervical, calvicular, axillary or inguinal LAD.   LAB RESULTS:  Lab Results  Component Value Date   NA 141 10/15/2016   K 3.9 10/15/2016   CL 110 10/15/2016   CO2 24 10/15/2016   GLUCOSE 87 10/15/2016   BUN 13  10/15/2016   CREATININE 0.78 10/15/2016   CALCIUM 8.6 (L) 10/15/2016   PROT 6.2 (L) 10/14/2016   ALBUMIN 3.3 (L) 10/14/2016   AST 20 10/14/2016   ALT 12 (L) 10/14/2016   ALKPHOS 111 10/14/2016   BILITOT 0.3 10/14/2016   GFRNONAA >60 10/15/2016   GFRAA >60 10/15/2016    Lab Results  Component Value Date   WBC 5.3 10/15/2016   NEUTROABS 10.3 (H) 03/07/2016   HGB 6.9 (L) 10/15/2016   HCT 23.2 (L) 10/15/2016   MCV 61.5 (L) 10/15/2016   PLT 1,222 ([place]) 10/15/2016     STUDIES: Dg Chest 1 View  Result Date: 10/14/2016 CLINICAL DATA:  Altered mental status.  Weakness. EXAM: CHEST 1 VIEW COMPARISON:  03/07/2016 . FINDINGS: Cardiomegaly with normal pulmonary vascularity. No focal infiltrate. No pleural effusion or pneumothorax. Degenerative changes thoracic spine. IMPRESSION: Cardiomegaly. No overt congestive heart failure. No focal pulmonary infiltrate . Electronically Signed   By: Marcello Moores  Register   On: 10/14/2016 15:08   Ct Head Wo Contrast  Result Date: 10/14/2016 CLINICAL DATA:  Altered mental status.  Dementia. EXAM: CT HEAD WITHOUT CONTRAST TECHNIQUE: Contiguous axial images were obtained from the base of the skull through the vertex without intravenous contrast. COMPARISON:  03/07/2016. FINDINGS: Brain: No evidence for acute infarction, hemorrhage, mass lesion, hydrocephalus, or extra-axial fluid. Advanced atrophy. Chronic microvascular ischemic change. Vascular: No hyperdense vessel. Vascular calcification not unexpected for age. Skull: Normal. Negative for fracture or focal lesion. Sinuses/Orbits: No acute finding. Other: None.  Compared with priors, similar appearance. IMPRESSION: Negative exam. Electronically Signed   By: Staci Righter M.D.   On: 10/14/2016 14:50    ASSESSMENT: Iron deficiency anemia, thrombocytosis.  PLAN:    1. Iron deficiency anemia: By report, there does not appear to be any blood loss and patient has a negative stool guaiac. Suspect this may be nutritional  and her hemoglobin has trended down over time. Given her advanced age and dementia, colonoscopy and bone marrow biopsy are not recommended. Agree with 1 unit packed red blood cells today. Hemolysis labs, B-12, and folate have been added for completeness. Patient may also benefit from IV iron in the future. Continue to monitor daily CBC while patient is in the hospital. She can follow-up in the [place] 2-3 weeks after discharge for repeat laboratory work and consideration of additional IV iron. 2. Thrombocytosis: Likely secondary to iron deficiency anemia. Continue to monitor.  Appreciate consult, call with questions.  Lloyd Huger, MD   10/15/2016 10:27 AM

## 2016-10-16 ENCOUNTER — Encounter
Admission: EM | Disposition: A | Discharge status: Skilled Nursing Facility | Payer: Self-pay | Source: Home / Self Care | Attending: Emergency Medicine

## 2016-10-16 ENCOUNTER — Encounter: Payer: Self-pay | Admitting: Anesthesiology

## 2016-10-16 ENCOUNTER — Observation Stay: Payer: Medicare Other | Admitting: Anesthesiology

## 2016-10-16 DIAGNOSIS — I471 Supraventricular tachycardia, unspecified: Secondary | ICD-10-CM

## 2016-10-16 DIAGNOSIS — D473 Essential (hemorrhagic) thrombocythemia: Secondary | ICD-10-CM

## 2016-10-16 DIAGNOSIS — Z5189 Encounter for other specified aftercare: Secondary | ICD-10-CM

## 2016-10-16 DIAGNOSIS — R198 Other specified symptoms and signs involving the digestive system and abdomen: Secondary | ICD-10-CM

## 2016-10-16 DIAGNOSIS — R4182 Altered mental status, unspecified: Secondary | ICD-10-CM | POA: Diagnosis not present

## 2016-10-16 DIAGNOSIS — K31811 Angiodysplasia of stomach and duodenum with bleeding: Secondary | ICD-10-CM

## 2016-10-16 HISTORY — PX: ESOPHAGOGASTRODUODENOSCOPY (EGD) WITH PROPOFOL: SHX5813

## 2016-10-16 LAB — TYPE AND SCREEN
Blood Product Expiration Date: 201803012359
ISSUE DATE / TIME: 201802171416
UNIT TYPE AND RH: 5100

## 2016-10-16 LAB — CBC
HEMATOCRIT: 26.7 % — AB (ref 35.0–47.0)
HEMOGLOBIN: 8.4 g/dL — AB (ref 12.0–16.0)
MCH: 20.4 pg — AB (ref 26.0–34.0)
MCHC: 31.3 g/dL — ABNORMAL LOW (ref 32.0–36.0)
MCV: 65.1 fL — AB (ref 80.0–100.0)
PLATELETS: 1097 10*3/uL — AB (ref 150–440)
RBC: 4.1 MIL/uL (ref 3.80–5.20)
RDW: 22.8 % — ABNORMAL HIGH (ref 11.5–14.5)
WBC: 6.6 10*3/uL (ref 3.6–11.0)

## 2016-10-16 LAB — HAPTOGLOBIN: Haptoglobin: 214 mg/dL — ABNORMAL HIGH (ref 34–200)

## 2016-10-16 SURGERY — ESOPHAGOGASTRODUODENOSCOPY (EGD) WITH PROPOFOL
Anesthesia: General

## 2016-10-16 MED ORDER — ALPRAZOLAM 0.25 MG PO TABS: 0.2500 mg | ORAL_TABLET | Freq: Three times a day (TID) | ORAL | 0 refills | Status: DC | PRN

## 2016-10-16 MED ORDER — DOCUSATE SODIUM 100 MG PO CAPS: 100.0000 mg | ORAL_CAPSULE | Freq: Two times a day (BID) | ORAL | 0 refills | Status: DC | PRN

## 2016-10-16 MED ORDER — PROPOFOL 500 MG/50ML IV EMUL
INTRAVENOUS | Status: AC
  Filled 2016-10-16: qty 50

## 2016-10-16 MED ORDER — SODIUM CHLORIDE 0.9 % IV SOLN
INTRAVENOUS | Status: DC | PRN
  Administered 2016-10-16: 09:00:00 via INTRAVENOUS

## 2016-10-16 MED ORDER — METOPROLOL TARTRATE 25 MG PO TABS
25.0000 mg | ORAL_TABLET | Freq: Two times a day (BID) | ORAL | 6 refills | Status: DC
Start: 2016-10-16 — End: 2019-02-26

## 2016-10-16 MED ORDER — QUETIAPINE FUMARATE 25 MG PO TABS: 12.5000 mg | ORAL_TABLET | Freq: Every day | ORAL | Status: DC

## 2016-10-16 MED ORDER — LIDOCAINE HCL (CARDIAC) 20 MG/ML IV SOLN
INTRAVENOUS | Status: DC | PRN
  Administered 2016-10-16: 100 mg via INTRATRACHEAL

## 2016-10-16 MED ORDER — PANTOPRAZOLE SODIUM 40 MG PO TBEC: 40.0000 mg | DELAYED_RELEASE_TABLET | Freq: Every day | ORAL | 6 refills | Status: DC

## 2016-10-16 MED ORDER — PROPOFOL 10 MG/ML IV BOLUS
INTRAVENOUS | Status: DC | PRN
  Administered 2016-10-16 (×2): 30 mg via INTRAVENOUS
  Administered 2016-10-16: 50 mg via INTRAVENOUS
  Administered 2016-10-16: 30 mg via INTRAVENOUS

## 2016-10-16 MED ORDER — POTASSIUM CHLORIDE IN NACL 20-0.9 MEQ/L-% IV SOLN
INTRAVENOUS | Status: DC
  Administered 2016-10-16: 11:00:00 via INTRAVENOUS
  Filled 2016-10-16 (×2): qty 1000

## 2016-10-16 MED ORDER — QUETIAPINE FUMARATE 25 MG PO TABS: 12.5000 mg | ORAL_TABLET | Freq: Every day | ORAL | 6 refills | Status: DC

## 2016-10-16 MED ORDER — LIDOCAINE HCL (PF) 2 % IJ SOLN
INTRAMUSCULAR | Status: AC
  Filled 2016-10-16: qty 2

## 2016-10-16 NOTE — Evaluation (Signed)
Physical Therapy Evaluation Patient Details Name: Savannah Young MRN: [id number] DOB: [birth year] Today's Date: 10/16/2016   History of Present Illness  presented to ER and admitted under observation secondary to decreased responsiveness, AMS.  Noted with anemia per labs (HgB current 8.4 after transfusion), status post EGD significant for single-bleeding lesion in duodenum, clipped during procedure.  Clinical Impression  Upon evaluation, patient alert and oriented to self only; follows simple commands, but demonstrates very limited insight into deficits and need for assist. Strength and ROM grossly symmetrical and WFL for basic transfers and mobility.  Currently performing bed mobility at mod indep; sit/stand, basic transfers and gait (220') with RW, cga/min assist.  Slight sway, stagger at times, but able to self-correct with LE step strategy. Would benefit from skilled PT to address above deficits and promote optimal return to PLOF; Recommend transition to [place] upon discharge from acute hospitalization.     Follow Up Recommendations Home health PT    Equipment Recommendations       Recommendations for Other Services       Precautions / Restrictions Precautions Precautions: Fall      Mobility  Bed Mobility Overal bed mobility: Modified Independent                Transfers Overall transfer level: Needs assistance Equipment used: Rolling walker (2 wheeled) Transfers: Sit to/from Stand Sit to Stand: Min guard            Ambulation/Gait Ambulation/Gait assistance: Min guard;Min assist Ambulation Distance (Feet): 220 Feet Assistive device: Rolling walker (2 wheeled)   Gait velocity: 10' walk time, 8-9 seconds   General Gait Details: reciprocal stepping pattern, forward flexed posture; staggering at times, but able to self correct with LE step strategy  Stairs            Wheelchair Mobility    Modified Rankin (Stroke Patients Only)       Balance  Overall balance assessment: Needs assistance Sitting-balance support: No upper extremity supported;Feet supported Sitting balance-Leahy Scale: Good     Standing balance support: Bilateral upper extremity supported Standing balance-Leahy Scale: Fair                               Pertinent Vitals/Pain Pain Assessment: No/denies pain    Home Living Family/patient expects to be discharged to:: Assisted living               Home Equipment: Walker - 4 wheels (per previous records)      Prior Function Level of Independence: Needs assistance         Comments: unable to asses due to impaired cognition     Hand Dominance        Extremity/Trunk Assessment   Upper Extremity Assessment Upper Extremity Assessment: Overall WFL for tasks assessed (grossly at least 4-/5 throughout)    Lower Extremity Assessment Lower Extremity Assessment: Overall WFL for tasks assessed (grossly at least 4-/5 throughout)       Communication   Communication: No difficulties  Cognition Arousal/Alertness: Awake/alert Behavior During Therapy: WFL for tasks assessed/performed Overall Cognitive Status: No family/caregiver present to determine baseline cognitive functioning                      General Comments      Exercises     Assessment/Plan    PT Assessment Patient needs continued PT services  PT Problem List Decreased strength;Decreased activity tolerance;Decreased  balance;Decreased mobility;Decreased knowledge of use of DME;Decreased safety awareness;Decreased knowledge of precautions          PT Treatment Interventions DME instruction;Gait training;Functional mobility training;Therapeutic activities;Therapeutic exercise;Balance training;Patient/family education    PT Goals (Current goals can be found in the Care Plan section)  Acute Rehab PT Goals Patient Stated Goal: to get warm PT Goal Formulation: With patient Time For Goal Achievement:  10/30/16 Potential to Achieve Goals: Fair    Frequency Min 2X/week   Barriers to discharge        Co-evaluation               End of Session Equipment Utilized During Treatment: Gait belt Activity Tolerance: Patient tolerated treatment well Patient left: in chair;with call bell/phone within reach;with chair alarm set Nurse Communication: Mobility status    Functional Assessment Tool Used: clinical judgment, 10' walk time Functional Limitation: Mobility: Walking and moving around Mobility: Walking and Moving Around Current Status [phone]): At least 20 percent but less than 40 percent impaired, limited or restricted Mobility: Walking and Moving Around Goal Status [phone]): At least 1 percent but less than 20 percent impaired, limited or restricted    Time: 1105-1117 PT Time Calculation (min) (ACUTE ONLY): 12 min   Charges:   PT Evaluation $PT Eval Low Complexity: 1 Procedure     PT G Codes:   PT G-Codes **NOT FOR INPATIENT CLASS** Functional Assessment Tool Used: clinical judgment, 10' walk time Functional Limitation: Mobility: Walking and moving around Mobility: Walking and Moving Around Current Status [phone]): At least 20 percent but less than 40 percent impaired, limited or restricted Mobility: Walking and Moving Around Goal Status [phone]): At least 1 percent but less than 20 percent impaired, limited or restricted    Seger Jani H. Owens Shark, PT, DPT, NCS 10/16/16, 12:57 PM [phone]

## 2016-10-16 NOTE — Anesthesia Preprocedure Evaluation (Signed)
Anesthesia Evaluation  Patient identified by MRN, date of birth, ID band Patient awake    Reviewed: Allergy & Precautions, NPO status , Patient's Chart, lab work & pertinent test results, reviewed documented beta blocker date and time   Airway Mallampati: II  TM Distance: >3 FB     Dental  (+) Chipped   Pulmonary COPD,           Cardiovascular hypertension, Pt. on medications      Neuro/Psych PSYCHIATRIC DISORDERS TIA   GI/Hepatic   Endo/Other  diabetes  Renal/GU      Musculoskeletal   Abdominal   Peds  Hematology  (+) anemia ,   Anesthesia Other Findings Dementia.Confusion. Psych hx. Hx of SVT with a rate of 176. Increased platelets. Last Hb 8.5.  Reproductive/Obstetrics                             Anesthesia Physical Anesthesia Plan  ASA: III  Anesthesia Plan: General   Post-op Pain Management:    Induction: Intravenous  Airway Management Planned: Natural Airway  Additional Equipment:   Intra-op Plan:   Post-operative Plan:   Informed Consent: I have reviewed the patients History and Physical, chart, labs and discussed the procedure including the risks, benefits and alternatives for the proposed anesthesia with the patient or authorized representative who has indicated his/her understanding and acceptance.     Plan Discussed with: CRNA  Anesthesia Plan Comments:         Anesthesia Quick Evaluation

## 2016-10-16 NOTE — Clinical Social Work Note (Signed)
Patient will dc to [facility] via transport by her brother, Melanie Crazier. The facility is aware. All paperwork has been sent and delivered to the unit. CSW will con't to follow pending additional dc needs.  Santiago Bumpers, MSW [employer] [phone]

## 2016-10-16 NOTE — NC FL2 (Signed)
[place] LEVEL OF CARE SCREENING TOOL     IDENTIFICATION  Patient Name: Savannah Young Birthdate: 03/12/1932 Sex: female Admission Date (Current Location): 10/14/2016  [place] and [place] Number:  Engineering geologist and Address:  [facility], [address] [place],  [address]      Provider Number: [id number]  Attending Physician Name and Address:  Theodoro Grist, MD  Relative Name and Phone Number:       Current Level of Care: Hospital Recommended Level of Care: [facility] Prior Approval Number:    Date Approved/Denied:   PASRR Number: [id number]  Discharge Plan: Domiciliary (Rest home)    Current Diagnoses: Patient Active Problem List   Diagnosis Date Noted  . Encounter for blood transfusion 10/16/2016  . Abnormal findings on esophagogastroduodenoscopy (EGD) 10/16/2016  . Angiodysplasia of stomach and duodenum with bleeding 10/16/2016  . Thrombocytosis ([place]) 10/16/2016  . SVT (supraventricular tachycardia) ([facility] [place] [facility]) 10/16/2016  . Iron deficiency anemia 10/14/2016  . Thrombocythemia ([place]) 10/14/2016  . TIA (transient ischemic attack) 10/14/2016  . Encephalopathy acute 03/07/2016  . Pressure ulcer 02/23/2016  . Confusion 02/22/2016  . Agitation 01/20/2016    Orientation RESPIRATION BLADDER Height & Weight     Self  Normal Continent Weight: 102 lb 12.8 oz (46.6 kg) Height:  5\' 1"  (154.9 cm)  BEHAVIORAL SYMPTOMS/MOOD NEUROLOGICAL BOWEL NUTRITION STATUS      Continent    AMBULATORY STATUS COMMUNICATION OF NEEDS Skin   Supervision Verbally Normal                       Personal Care Assistance Level of Assistance  Bathing, Feeding, Dressing Bathing Assistance: Limited assistance Feeding assistance: Independent Dressing Assistance: Limited assistance     Functional Limitations Info             SPECIAL CARE FACTORS FREQUENCY                       Contractures Contractures Info:  Present    Additional Factors Info                  Current Medications (10/16/2016):  This is the current hospital active medication list Current Facility-Administered Medications  Medication Dose Route Frequency Provider Last Rate Last Dose  . 0.9 % NaCl with KCl 20 mEq/ L  infusion   Intravenous Continuous Theodoro Grist, MD 75 mL/hr at 10/16/16 1030    . acetaminophen (TYLENOL) tablet 500 mg  500 mg Oral TID PRN Vaughan Basta, MD      . ALPRAZolam Duanne Moron) tablet 0.25 mg  0.25 mg Oral TID PRN Vaughan Basta, MD      . atorvastatin (LIPITOR) tablet 20 mg  20 mg Oral Daily Vaughan Basta, MD   20 mg at 10/15/16 0944  . docusate sodium (COLACE) capsule 100 mg  100 mg Oral BID PRN Vaughan Basta, MD      . feeding supplement (ENSURE ENLIVE) (ENSURE ENLIVE) liquid 237 mL  237 mL Oral BID BM Vaughan Basta, MD   237 mL at 10/15/16 1453  . guaifenesin (ROBITUSSIN) 100 MG/5ML syrup 200 mg  200 mg Oral TID PRN Vaughan Basta, MD      . heparin injection 5,000 Units  5,000 Units Subcutaneous Q8H Vaughan Basta, MD   5,000 Units at 10/16/16 0451  . Melatonin TABS 10 mg  10 mg Oral QHS Vaughan Basta, MD   10 mg at 10/15/16 2125  .  memantine (NAMENDA XR) 24 hr capsule 28 mg  28 mg Oral QHS Vaughan Basta, MD   28 mg at 10/15/16 2125  . metoprolol tartrate (LOPRESSOR) tablet 25 mg  25 mg Oral BID Theodoro Grist, MD   Stopped at 10/16/16 [id number]  . pantoprazole (PROTONIX) EC tablet 40 mg  40 mg Oral Daily Theodoro Grist, MD   40 mg at 10/15/16 1606  . QUEtiapine (SEROQUEL) tablet 12.5 mg  12.5 mg Oral QAC supper Theodoro Grist, MD         Discharge Medications:  DISCHARGE MEDICATIONS:       Current Discharge Medication List        START taking these medications   Details  docusate sodium (COLACE) 100 MG capsule Take 1 capsule (100 mg total) by mouth 2 (two) times daily as needed for mild constipation. Qty: 10 capsule, Refills:  0    metoprolol tartrate (LOPRESSOR) 25 MG tablet Take 1 tablet (25 mg total) by mouth 2 (two) times daily. Qty: 60 tablet, Refills: 6    pantoprazole (PROTONIX) 40 MG tablet Take 1 tablet (40 mg total) by mouth daily. Qty: 30 tablet, Refills: 6    QUEtiapine (SEROQUEL) 25 MG tablet Take 0.5 tablets (12.5 mg total) by mouth daily before supper. Qty: 15 tablet, Refills: 6          CONTINUE these medications which have CHANGED   Details  ALPRAZolam (XANAX) 0.25 MG tablet Take 1 tablet (0.25 mg total) by mouth 3 (three) times daily as needed for anxiety. Qty: 30 tablet, Refills: 0          CONTINUE these medications which have NOT CHANGED   Details  acetaminophen (TYLENOL) 500 MG tablet Take 500 mg by mouth 3 (three) times daily as needed for mild pain.    atorvastatin (LIPITOR) 20 MG tablet Take 1 tablet (20 mg total) by mouth daily. Qty: 30 tablet, Refills: 0    Cranberry 425 MG CAPS Take 425 mg by mouth daily.    fexofenadine (ALLEGRA) 180 MG tablet Take 180 mg by mouth daily.    guaifenesin (ROBITUSSIN) 100 MG/5ML syrup Take 200 mg by mouth 3 (three) times daily as needed for cough.    Melatonin 10 MG TABS Take 10 mg by mouth at bedtime.    memantine (NAMENDA XR) 28 MG CP24 24 hr capsule Take 28 mg by mouth at bedtime.    feeding supplement, ENSURE ENLIVE, (ENSURE ENLIVE) LIQD Take 237 mLs by mouth 2 (two) times daily between meals. Qty: 237 mL, Refills: 12         STOP taking these medications     aspirin 325 MG EC tablet      diphenhydrAMINE (BENADRYL) 25 mg capsule      ibuprofen (ADVIL,MOTRIN) 800 MG tablet          Relevant Imaging Results:  Relevant Lab Results:   Additional Information SS#[id number]  Zettie Pho, LCSW

## 2016-10-16 NOTE — Anesthesia Postprocedure Evaluation (Signed)
Anesthesia Post Note  Patient: Savannah Young  Procedure(s) Performed: Procedure(s) (LRB): ESOPHAGOGASTRODUODENOSCOPY (EGD) WITH PROPOFOL (N/A)  Patient location during evaluation: Endoscopy Anesthesia Type: General Level of consciousness: awake and alert Pain management: pain level controlled Vital Signs Assessment: post-procedure vital signs reviewed and stable Respiratory status: spontaneous breathing, nonlabored ventilation, respiratory function stable and patient connected to nasal cannula oxygen Cardiovascular status: blood pressure returned to baseline and stable Postop Assessment: no signs of nausea or vomiting Anesthetic complications: no     Last Vitals:  Vitals:   10/16/16 0952 10/16/16 1018  BP: (!) 119/57 (!) 113/55  Pulse: 66 65  Resp: 18 20  Temp:      Last Pain:  Vitals:   10/16/16 0927  TempSrc:   PainSc: [name]

## 2016-10-16 NOTE — Clinical Social Work Note (Signed)
CSW alerted that the patient will be dc to [facility] today. CSW is waiting for call back from facility to arrange dc. CSW is following.  Santiago Bumpers, MSW, Latanya Presser [phone]

## 2016-10-16 NOTE — Progress Notes (Signed)
Brother Savannah Young came to pick up patients. Pt has no further concerns at this time.

## 2016-10-16 NOTE — Progress Notes (Signed)
Rica Mote brother was notified of need for EGD. Gave verbal consent with Secondary school teacher. No further orders at this time.

## 2016-10-16 NOTE — Discharge Summary (Signed)
[facility] [facility] [place] NAME: Savannah Young    MR#:  [id number]  DATE OF BIRTH:  1932/01/22  DATE OF ADMISSION:  10/14/2016 ADMITTING PHYSICIAN: Vaughan Basta, MD  DATE OF DISCHARGE: No discharge date for patient encounter.  PRIMARY CARE PHYSICIAN: No PCP Per Patient     ADMISSION DIAGNOSIS:  Cerebral infarction ([place]) [I63.9] CVA (cerebral infarction) [I63.9] Symptomatic anemia [D64.9] Altered mental status, unspecified altered mental status type [R41.82]  DISCHARGE DIAGNOSIS:  Principal Problem:   Confusion Active Problems:   Abnormal findings on esophagogastroduodenoscopy (EGD)   Angiodysplasia of stomach and duodenum with bleeding   Iron deficiency anemia   Thrombocythemia ([place])   Encounter for blood transfusion   Thrombocytosis ([facility])   SVT (supraventricular tachycardia) (H[facility])   TIA (transient ischemic attack)   SECONDARY DIAGNOSIS:   Past Medical History:  Diagnosis Date  . COPD (chronic obstructive pulmonary disease) ([place])   . Dementia   . Diabetes mellitus ([place])   . Hypertension     .pro HOSPITAL COURSE:   The patient is a 83 year old female with past medical history significant for history of COPD, dementia, diabetes, hypertension, who lives in assisted living facility, who presents to the hospital with complaints of poor responsiveness, somnolence. On arrival to emergency room, patient was back to her baseline. She was noted to have significant drop in her hemoglobin level from 12.8 in July 2017 to 6.9 during this admission. She was also noted to have markedly elevated platelet count. Hemoccult of stool was negative. In emergency room. Patient herself is not able to provide no history due to dementia. The patient was evaluated by gastroenterologist and felt that she may benefit from EGD. EGD was performed on 10/16/2016, revealing reflux esophagitis, bleeding, angiodysplasia in duodenum, which was cauterized.  Gastroenterologist  recommended to abstain from aspirin therapy for 3 days, not to use any nonsteroidal anti-inflammatory medications ever, use Tylenol if needed, continue Protonix 40 mg daily dose.  Gastroenterologist did not feel the patient is a candidate for colonoscopy. She was also seen by oncologist. 4. Thrombocytosis, anemia. Oncologist felt that patient's thrombocytosis was very likely iron deficiency anemia related. He recommended to transfuse patient with packed red blood cells, monitor CBC and follow-up in cancer center in 2-3 weeks to repeat labs and for consideration of IV iron. Folic acid level was found to be normal, as well as vitamin B12, LDH was mildly elevated at 223. Iron studies revealed a low iron saturation at 4, iron level was only 15 . The patient was also evaluated by psychiatrist, who recommended to initiate patient on the Seroquel at 25 mg daily dose, however, due to patient's size and and baseline somnolence. We felt that patient may benefit from 12.5 mg daily at bedtime dose, to be advanced to 25 or higher if needed. Psychiatry is also felt that Xanax Dose Should Be Decreased to 0.25 Mg Twice Daily and Continue Namenda. Neurologist saw patient in consultation and felt that patient's mentation has improved to her baseline, he did not recommend MRI at this time. Patient was felt to be stable to be discharged back to assisted living facility today with home health services.  . Discussion by problem: 1. Altered mental status, likely due to dementia with agitation, however, cannot rule out delirium, improved, at baseline, the patient was seen by psychiatrist who recommended to initiate her on Seroquel, patient was started on the 12.5 mg daily at bedtime, Seroquel, to be advanced as needed. CT  of head was unremarkable, neurologist saw patient in consultation and did not recommend any additional imaging.  #2. Iron deficiency anemia , status post 1 unit of packed red blood cell transfusion,  hemoglobin level is stable, , 8.4 on the day of discharge, it is recommended to follow patient's hemoglobin levels as outpatient, she is to follow-up with oncologist, Dr. Grayland Ormond in 2 weeks after discharge. Patient was advised to stop nonsteroidal anti-inflammatory medications altogether, continue Protonix.  #3. Thrombocytosis, likely iron deficiency related, according to oncologist , improved with transfusion, it is recommended to follow patient's CBC as outpatient, patient is to follow-up with oncologist as outpatient. #4. SVT, with a rate of 176, continue metoprolol, patient is back in sinus rhythm. TSH was normal at 1.3 #5. Essential hypertension, continue metoprolol, blood pressure is well controlled #6. Generalized weakness, initiate physical therapy as outpatient STABLE CONSULTS OBTAINED:  Treatment Team:  Alexis Goodell, MD Lloyd Huger, MD Lenward Chancellor, MD San Jetty, MD  DRUG ALLERGIES:  No Known Allergies  DISCHARGE MEDICATIONS:   Current Discharge Medication List    START taking these medications   Details  docusate sodium (COLACE) 100 MG capsule Take 1 capsule (100 mg total) by mouth 2 (two) times daily as needed for mild constipation. Qty: 10 capsule, Refills: 0    metoprolol tartrate (LOPRESSOR) 25 MG tablet Take 1 tablet (25 mg total) by mouth 2 (two) times daily. Qty: 60 tablet, Refills: 6    pantoprazole (PROTONIX) 40 MG tablet Take 1 tablet (40 mg total) by mouth daily. Qty: 30 tablet, Refills: 6    QUEtiapine (SEROQUEL) 25 MG tablet Take 0.5 tablets (12.5 mg total) by mouth daily before supper. Qty: 15 tablet, Refills: 6      CONTINUE these medications which have CHANGED   Details  ALPRAZolam (XANAX) 0.25 MG tablet Take 1 tablet (0.25 mg total) by mouth 3 (three) times daily as needed for anxiety. Qty: 30 tablet, Refills: 0      CONTINUE these medications which have NOT CHANGED   Details  acetaminophen (TYLENOL) 500 MG tablet Take 500 mg by  mouth 3 (three) times daily as needed for mild pain.    atorvastatin (LIPITOR) 20 MG tablet Take 1 tablet (20 mg total) by mouth daily. Qty: 30 tablet, Refills: 0    Cranberry 425 MG CAPS Take 425 mg by mouth daily.    fexofenadine (ALLEGRA) 180 MG tablet Take 180 mg by mouth daily.    guaifenesin (ROBITUSSIN) 100 MG/5ML syrup Take 200 mg by mouth 3 (three) times daily as needed for cough.    Melatonin 10 MG TABS Take 10 mg by mouth at bedtime.    memantine (NAMENDA XR) 28 MG CP24 24 hr capsule Take 28 mg by mouth at bedtime.    feeding supplement, ENSURE ENLIVE, (ENSURE ENLIVE) LIQD Take 237 mLs by mouth 2 (two) times daily between meals. Qty: 237 mL, Refills: 12      STOP taking these medications     aspirin 325 MG EC tablet      diphenhydrAMINE (BENADRYL) 25 mg capsule      ibuprofen (ADVIL,MOTRIN) 800 MG tablet          DISCHARGE INSTRUCTIONS:    Patient is to follow-up with oncologist, Dr. Grayland Ormond in about 2 weeks after discharge   If you experience worsening of your admission symptoms, develop shortness of breath, life threatening emergency, suicidal or homicidal thoughts you must seek medical attention immediately by calling 911 or calling your MD  immediately  if symptoms less severe.  You Must read complete instructions/literature along with all the possible adverse reactions/side effects for all the Medicines you take and that have been prescribed to you. Take any new Medicines after you have completely understood and accept all the possible adverse reactions/side effects.   Please note  You were cared for by a hospitalist during your hospital stay. If you have any questions about your discharge medications or the care you received while you were in the hospital after you are discharged, you can call the unit and asked to speak with the hospitalist on call if the hospitalist that took care of you is not available. Once you are discharged, your primary care  physician will handle any further medical issues. Please note that NO REFILLS for any discharge medications will be authorized once you are discharged, as it is imperative that you return to your primary care physician (or establish a relationship with a primary care physician if you do not have one) for your aftercare needs so that they can reassess your need for medications and monitor your lab values.    Today   CHIEF COMPLAINT:   Chief Complaint  Patient presents with  . Altered Mental Status    HISTORY OF PRESENT ILLNESS:  Krystal Canjura  is a 83 y.o. female with a known history of COPD, dementia, diabetes, hypertension, who lives in assisted living facility, who presents to the hospital with complaints of poor responsiveness, somnolence. On arrival to emergency room, patient was back to her baseline. She was noted to have significant drop in her hemoglobin level from 12.8 in July 2017 to 6.9 during this admission. She was also noted to have markedly elevated platelet count. Hemoccult of stool was negative. In emergency room. Patient herself is not able to provide no history due to dementia. The patient was evaluated by gastroenterologist and felt that she may benefit from EGD. EGD was performed on 10/16/2016, revealing reflux esophagitis, bleeding, angiodysplasia in duodenum, which was cauterized. Gastroenterologist  recommended to abstain from aspirin therapy for 3 days, not to use any nonsteroidal anti-inflammatory medications ever, use Tylenol if needed, continue Protonix 40 mg daily dose.  Gastroenterologist did not feel the patient is a candidate for colonoscopy. She was also seen by oncologist. 4. Thrombocytosis, anemia. Oncologist felt that patient's thrombocytosis was very likely iron deficiency anemia related. He recommended to transfuse patient with packed red blood cells, monitor CBC and follow-up in cancer center in 2-3 weeks to repeat labs and for consideration of IV iron. Folic acid  level was found to be normal, as well as vitamin B12, LDH was mildly elevated at 223. Iron studies revealed a low iron saturation at 4, iron level was only 15 . The patient was also evaluated by psychiatrist, who recommended to initiate patient on the Seroquel at 25 mg daily dose, however, due to patient's size and and baseline somnolence. We felt that patient may benefit from 12.5 mg daily at bedtime dose, to be advanced to 25 or higher if needed. Psychiatry is also felt that Xanax Dose Should Be Decreased to 0.25 Mg Twice Daily and Continue Namenda. Neurologist saw patient in consultation and felt that patient's mentation has improved to her baseline, he did not recommend MRI at this time. Patient was felt to be stable to be discharged back to assisted living facility today with home health services.  . Discussion by problem: 1. Altered mental status, likely due to dementia with agitation, however, cannot rule  out delirium, improved, at baseline, the patient was seen by psychiatrist who recommended to initiate her on Seroquel, patient was started on the 12.5 mg daily at bedtime, Seroquel, to be advanced as needed. CT of head was unremarkable, neurologist saw patient in consultation and did not recommend any additional imaging.  #2. Iron deficiency anemia , status post 1 unit of packed red blood cell transfusion, hemoglobin level is stable, , 8.4 on the day of discharge, it is recommended to follow patient's hemoglobin levels as outpatient, she is to follow-up with oncologist, Dr. Grayland Ormond in 2 weeks after discharge. Patient was advised to stop nonsteroidal anti-inflammatory medications altogether, continue Protonix.  #3. Thrombocytosis, likely iron deficiency related, according to oncologist , improved with transfusion, it is recommended to follow patient's CBC as outpatient, patient is to follow-up with oncologist as outpatient. #4. SVT, with a rate of 176, continue metoprolol, patient is back in sinus  rhythm. TSH was normal at 1.3 #5. Essential hypertension, continue metoprolol, blood pressure is well controlled #6. Generalized weakness, initiate physical therapy as outpatient STABLE    VITAL SIGNS:  Blood pressure (!) 113/55, pulse 65, temperature 98.2 F (36.8 C), temperature source Tympanic, resp. rate 20, height 5\' 1"  (1.549 m), weight 46.6 kg (102 lb 12.8 oz), last menstrual period 01/20/2016, SpO2 99 %.  I/O:   Intake/Output Summary (Last 24 hours) at 10/16/16 1141 Last data filed at 10/16/16 0913  Gross per 24 hour  Intake              742 ml  Output                0 ml  Net              742 ml    PHYSICAL EXAMINATION:  GENERAL:  83 y.o.-year-old patient lying in the bed with no acute distress.  EYES: Pupils equal, round, reactive to light and accommodation. No scleral icterus. Extraocular muscles intact.  HEENT: Head atraumatic, normocephalic. Oropharynx and nasopharynx clear.  NECK:  Supple, no jugular venous distention. No thyroid enlargement, no tenderness.  LUNGS: Normal breath sounds bilaterally, no wheezing, rales,rhonchi or crepitation. No use of accessory muscles of respiration.  CARDIOVASCULAR: S1, S2 normal. No murmurs, rubs, or gallops.  ABDOMEN: Soft, non-tender, non-distended. Bowel sounds present. No organomegaly or mass.  EXTREMITIES: No pedal edema, cyanosis, or clubbing.  NEUROLOGIC: Cranial nerves II through XII are intact. Muscle strength 5/5 in all extremities. Sensation intact. Gait not checked.  PSYCHIATRIC: The patient isSomnolent, not able to answer questions, demented, not able to assess orientation, pushes examiner away  SKIN: No obvious rash, lesion, or ulcer.   DATA REVIEW:   CBC  Recent Labs Lab 10/16/16 0532  WBC 6.6  HGB 8.4*  HCT 26.7*  PLT 1,097*    Chemistries   Recent Labs Lab 10/14/16 1236 10/15/16 0538  NA 141 141  K 4.0 3.9  CL 111 110  CO2 24 24  GLUCOSE 93 87  BUN 14 13  CREATININE 0.88 0.78  CALCIUM 8.6*  8.6*  AST 20  --   ALT 12*  --   ALKPHOS 111  --   BILITOT 0.3  --     Cardiac Enzymes  Recent Labs Lab 10/14/16 1236  TROPONINI <0.03    Microbiology Results  Results for orders placed or performed during the hospital encounter of 10/14/16  MRSA PCR Screening     Status: None   Collection Time: 10/14/16  5:17 PM  Result  Value Ref Range Status   MRSA by PCR NEGATIVE NEGATIVE Final    Comment:        The GeneXpert MRSA Assay (FDA approved for NASAL specimens only), is one component of a comprehensive MRSA colonization surveillance program. It is not intended to diagnose MRSA infection nor to guide or monitor treatment for MRSA infections.     RADIOLOGY:  Dg Chest 1 View  Result Date: 10/14/2016 CLINICAL DATA:  Altered mental status.  Weakness. EXAM: CHEST 1 VIEW COMPARISON:  03/07/2016 . FINDINGS: Cardiomegaly with normal pulmonary vascularity. No focal infiltrate. No pleural effusion or pneumothorax. Degenerative changes thoracic spine. IMPRESSION: Cardiomegaly. No overt congestive heart failure. No focal pulmonary infiltrate . Electronically Signed   By: Marcello Moores  Register   On: 10/14/2016 15:08   Ct Head Wo Contrast  Result Date: 10/14/2016 CLINICAL DATA:  Altered mental status.  Dementia. EXAM: CT HEAD WITHOUT CONTRAST TECHNIQUE: Contiguous axial images were obtained from the base of the skull through the vertex without intravenous contrast. COMPARISON:  03/07/2016. FINDINGS: Brain: No evidence for acute infarction, hemorrhage, mass lesion, hydrocephalus, or extra-axial fluid. Advanced atrophy. Chronic microvascular ischemic change. Vascular: No hyperdense vessel. Vascular calcification not unexpected for age. Skull: Normal. Negative for fracture or focal lesion. Sinuses/Orbits: No acute finding. Other: None.  Compared with priors, similar appearance. IMPRESSION: Negative exam. Electronically Signed   By: Staci Righter M.D.   On: 10/14/2016 14:50    EKG:   Orders placed  or performed during the hospital encounter of 10/14/16  . ED EKG  . ED EKG  . EKG 12-Lead  . EKG 12-Lead      Management plans discussed with the patient, family and they are in agreement.  CODE STATUS:     Code Status Orders        Start     Ordered   10/14/16 1647  Full code  Continuous     10/14/16 1646    Code Status History    Date Active Date Inactive Code Status Order ID Comments User Context   03/07/2016  2:20 PM 03/09/2016  5:35 PM Full Code [id number]  Lytle Butte, MD ED   02/22/2016  8:10 PM 02/24/2016  7:54 PM Full Code [id number]  Hillary Bow, MD ED   01/20/2016  7:47 PM 01/21/2016  4:40 PM Full Code [id number]  Gladstone Lighter, MD ED      TOTAL TIME TAKING CARE OF THIS PATIENT63minutes.    Theodoro Grist M.D on 10/16/2016 at 11:41 AM  Between 7am to 6pm - Pager - [phone]  After 6pm go to [email or web address] - password [email or web address] [facility]  [facility]  Office  [phone]  CC: Primary care physician; No PCP Per Patient

## 2016-10-16 NOTE — Anesthesia Post-op Follow-up Note (Cosign Needed)
Anesthesia QCDR form completed.        

## 2016-10-16 NOTE — Progress Notes (Signed)
Physical Therapy Progress/Cancellation Note  Evaluation re-attempted.  Patient currently off unit for EGD.  Will re-attempt at later time/date as patient available and medically appropriate  Mekhia Brogan H. Owens Shark, PT, DPT, NCS 10/16/16, 8:56 AM [phone]

## 2016-10-16 NOTE — Progress Notes (Signed)
Savannah Young was made aware of pt plan of  Care. Stated he would be here at 3p to pick pt up. Iv and tele removed. Discharge instructions in packet. Prescriptions in packet. Report not needed to be called to [facility] per csw karen. Pt has no further concerns at this time.

## 2016-10-16 NOTE — Care Management Note (Signed)
Case Management Note  Patient Details  Name: Savannah Young MRN: [id number] Date of Birth: 1932/02/14  Subjective/Objective:      Referral called to [facility] per Ms Oh is returning to [facility] today with orders for HH-PT.  Per Malachy Mood at [employer] a physical therapist will see Mrs Cardinali at [employer].              Action/Plan:   Expected Discharge Date:  10/16/16               Expected Discharge Plan:     In-House Referral:     Discharge planning Services     Post Acute Care Choice:    Choice offered to:     DME Arranged:    DME Agency:     HH Arranged:    HH Agency:     Status of Service:     If discussed at [employer] [facility] [employer], dates discussed:    Additional Comments:  Abbigail Anstey A, RN 10/16/2016, 12:14 PM

## 2016-10-16 NOTE — Transfer of Care (Signed)
Immediate Anesthesia Transfer of Care Note  Patient: Savannah Young  Procedure(s) Performed: Procedure(s): ESOPHAGOGASTRODUODENOSCOPY (EGD) WITH PROPOFOL (N/A)  Patient Location: PACU  Anesthesia Type:General  Level of Consciousness: sedated  Airway & Oxygen Therapy: Patient Spontanous Breathing and Patient connected to nasal cannula oxygen  Post-op Assessment: Report given to RN and Post -op Vital signs reviewed and stable  Post vital signs: Reviewed and stable  Last Vitals:  Vitals:   10/16/16 0359 10/16/16 0405  BP: (!) 96/33 (!) 105/45  Pulse: 64 70  Resp: 19   Temp: 36.7 C     Last Pain:  Vitals:   10/16/16 0732  TempSrc:   PainSc: 0-No pain         Complications: No apparent anesthesia complications

## 2016-10-16 NOTE — Progress Notes (Signed)
Pt slept well through the night, waking up to use BR at intervals, stand by assist to BR, this morning pt got up out of bed, then stated that she was trying to get back to her "own bed", during this time pt with episode of agitation, uncooperative and combative with staff, pt finally able to be redirected and assisted back to bed

## 2016-10-16 NOTE — Op Note (Signed)
[facility] Patient Name: Savannah Young Procedure Date: 10/16/2016 8:35 AM MRN: [id number] Account #: [id number] Date of Birth: 1931/09/19 Admit Type: Inpatient Age: 83 Room: [facility] ENDO ROOM 4 Gender: Female Note Status: Finalized Procedure:            Upper GI endoscopy Indications:          Iron deficiency anemia due to suspected upper                        gastrointestinal bleeding. H/x of ASA and Ibuprofen use. Providers:            San Jetty MD Referring MD:         Vaughan Basta (Referring MD) Medicines:            General Anesthesia Complications:        No immediate complications. Estimated blood loss:                        Minimal. Procedure:            Pre-Anesthesia Assessment:                       - Prior to the procedure, a History and Physical was                        performed, and patient medications and allergies were                        reviewed. The patient's tolerance of previous                        anesthesia was also reviewed. The risks and benefits of                        the procedure and the sedation options and risks were                        discussed with the patient. All questions were                        answered, and informed consent was obtained. Prior                        Anticoagulants: The patient has taken aspirin, last                        dose was 3 days prior to procedure. ASA Grade                        Assessment: II - A patient with mild systemic disease.                        After reviewing the risks and benefits, the patient was                        deemed in satisfactory condition to undergo the                        procedure.  After obtaining informed consent, the endoscope was                        passed under direct vision. Throughout the procedure,                        the patient's blood pressure, pulse, and oxygen      saturations were monitored continuously. The Endoscope                        was introduced through the mouth, and advanced to the                        second part of duodenum. The upper GI endoscopy was                        accomplished without difficulty. The patient tolerated                        the procedure well. Findings:      The hypopharynx was normal.      LA Grade B (one or more mucosal breaks greater than 5 mm, not extending       between the tops of two mucosal folds) esophagitis with no bleeding was       found in the distal esophagus.      Multiple dispersed, small non-bleeding erosions were found in the       gastric antrum. There were no stigmata of recent bleeding.      A single small angiodysplastic lesion with bleeding was found in the       second portion of the duodenum. Coagulation for hemostasis using bipolar       probe was successful. For hemostasis, one hemostatic clip was       successfully placed (MR conditional). There was no bleeding at the end       of the procedure. Impression:           - Normal hypopharynx.                       - LA Grade B reflux esophagitis.                       - Non-bleeding erosive gastropathy.                       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 Treated with bipolar cautery. Clip (MR                        conditional) was placed.                       - No specimens collected. Recommendation:       - Soft diet.                       - Continue present medications.                       - No aspirin x 3  days, No ibuprofen, naproxen, or other                        non-steroidal anti-inflammatory drugs at all. Use                        Tylenol ES.                       - Not a candidate for colonoscopy at this time.                       - Use Protonix (pantoprazole) 40 mg PO daily given EGD                        findings and use of ASA and Ibuprofen. Procedure  Code(s):    --- Professional ---                       [phone], Esophagogastroduodenoscopy, flexible, transoral;                        with control of bleeding, any method Diagnosis Code(s):    --- Professional ---                       D50.9, Iron deficiency anemia, unspecified CPT copyright 2016 American Medical Association. All rights reserved. The codes documented in this report are preliminary and upon coder review may  be revised to meet current compliance requirements. San Jetty, MD San Jetty MD,  10/16/2016 9:21:51 AM Number of Addenda: 0 Note Initiated On: 10/16/2016 8:35 AM Estimated Blood Loss: Estimated blood loss was minimal.      [facility]

## 2016-10-17 ENCOUNTER — Encounter: Payer: Self-pay | Admitting: Gastroenterology

## 2016-11-06 NOTE — Progress Notes (Deleted)
[place]  [phone] [phone]  ID: Savannah Young OB: 04/05/1932  MR#: [id number]  MHD#:[id number]  Patient Care Team: No Pcp Per Patient as PCP - General (General Practice)  CHIEF COMPLAINT: Iron deficiency anemia.  INTERVAL HISTORY: ***  REVIEW OF SYSTEMS:   ROS  As per HPI. Otherwise, a complete review of systems is negative.  PAST MEDICAL HISTORY: Past Medical History:  Diagnosis Date  . COPD (chronic obstructive pulmonary disease) ([place])   . Dementia   . Diabetes mellitus ([place])   . Hypertension     PAST SURGICAL HISTORY: Past Surgical History:  Procedure Laterality Date  . ESOPHAGOGASTRODUODENOSCOPY (EGD) WITH PROPOFOL N/A 10/16/2016   Procedure: ESOPHAGOGASTRODUODENOSCOPY (EGD) WITH PROPOFOL;  Surgeon: San Jetty, MD;  Location: [facility] ENDOSCOPY;  Service: Gastroenterology;  Laterality: N/A;    FAMILY HISTORY: Family History  Problem Relation Age of Onset  . Hypertension Other     ADVANCED DIRECTIVES (Y/N):  N  HEALTH MAINTENANCE: Social History  Substance Use Topics  . Smoking status: Never Smoker  . Smokeless tobacco: Former Systems developer  . Alcohol use No     Colonoscopy:  PAP:  Bone density:  Lipid panel:  No Known Allergies  Current Outpatient Prescriptions  Medication Sig Dispense Refill  . acetaminophen (TYLENOL) 500 MG tablet Take 500 mg by mouth 3 (three) times daily as needed for mild pain.    Marland Kitchen ALPRAZolam (XANAX) 0.25 MG tablet Take 1 tablet (0.25 mg total) by mouth 3 (three) times daily as needed for anxiety. 30 tablet 0  . atorvastatin (LIPITOR) 20 MG tablet Take 1 tablet (20 mg total) by mouth daily. 30 tablet 0  . Cranberry 425 MG CAPS Take 425 mg by mouth daily.    Marland Kitchen docusate sodium (COLACE) 100 MG capsule Take 1 capsule (100 mg total) by mouth 2 (two) times daily as needed for mild constipation. 10 capsule 0  . feeding supplement, ENSURE ENLIVE, (ENSURE ENLIVE) LIQD Take 237 mLs by mouth 2 (two)  times daily between meals. 237 mL 12  . fexofenadine (ALLEGRA) 180 MG tablet Take 180 mg by mouth daily.    Marland Kitchen guaifenesin (ROBITUSSIN) 100 MG/5ML syrup Take 200 mg by mouth 3 (three) times daily as needed for cough.    . Melatonin 10 MG TABS Take 10 mg by mouth at bedtime.    . memantine (NAMENDA XR) 28 MG CP24 24 hr capsule Take 28 mg by mouth at bedtime.    . metoprolol tartrate (LOPRESSOR) 25 MG tablet Take 1 tablet (25 mg total) by mouth 2 (two) times daily. 60 tablet 6  . pantoprazole (PROTONIX) 40 MG tablet Take 1 tablet (40 mg total) by mouth daily. 30 tablet 6  . QUEtiapine (SEROQUEL) 25 MG tablet Take 0.5 tablets (12.5 mg total) by mouth daily before supper. 15 tablet 6   No current facility-administered medications for this visit.     OBJECTIVE: There were no vitals filed for this visit.   There is no height or weight on file to calculate BMI.    ECOG FS:{CHL ONC [id number]  General: Well-developed, well-nourished, no acute distress. Eyes: Pink conjunctiva, anicteric sclera. HEENT: Normocephalic, moist mucous membranes, clear oropharnyx. Lungs: Clear to auscultation bilaterally. Heart: Regular rate and rhythm. No rubs, murmurs, or gallops. Abdomen: Soft, nontender, nondistended. No organomegaly noted, normoactive bowel sounds. Musculoskeletal: No edema, cyanosis, or clubbing. Neuro: Alert, answering all questions appropriately. Cranial nerves grossly intact. Skin: No rashes or petechiae noted. Psych: Normal affect. Lymphatics: No  cervical, calvicular, axillary or inguinal LAD.   LAB RESULTS:  Lab Results  Component Value Date   NA 141 10/15/2016   K 3.9 10/15/2016   CL 110 10/15/2016   CO2 24 10/15/2016   GLUCOSE 87 10/15/2016   BUN 13 10/15/2016   CREATININE 0.78 10/15/2016   CALCIUM 8.6 (L) 10/15/2016   PROT 6.2 (L) 10/14/2016   ALBUMIN 3.3 (L) 10/14/2016   AST 20 10/14/2016   ALT 12 (L) 10/14/2016   ALKPHOS 111 10/14/2016   BILITOT 0.3 10/14/2016    GFRNONAA >60 10/15/2016   GFRAA >60 10/15/2016    Lab Results  Component Value Date   WBC 6.6 10/16/2016   NEUTROABS 10.3 (H) 03/07/2016   HGB 8.4 (L) 10/16/2016   HCT 26.7 (L) 10/16/2016   MCV 65.1 (L) 10/16/2016   PLT 1,097 ([place]) 10/16/2016     STUDIES: Dg Chest 1 View  Result Date: 10/14/2016 CLINICAL DATA:  Altered mental status.  Weakness. EXAM: CHEST 1 VIEW COMPARISON:  03/07/2016 . FINDINGS: Cardiomegaly with normal pulmonary vascularity. No focal infiltrate. No pleural effusion or pneumothorax. Degenerative changes thoracic spine. IMPRESSION: Cardiomegaly. No overt congestive heart failure. No focal pulmonary infiltrate . Electronically Signed   By: Marcello Moores  Register   On: 10/14/2016 15:08   Ct Head Wo Contrast  Result Date: 10/14/2016 CLINICAL DATA:  Altered mental status.  Dementia. EXAM: CT HEAD WITHOUT CONTRAST TECHNIQUE: Contiguous axial images were obtained from the base of the skull through the vertex without intravenous contrast. COMPARISON:  03/07/2016. FINDINGS: Brain: No evidence for acute infarction, hemorrhage, mass lesion, hydrocephalus, or extra-axial fluid. Advanced atrophy. Chronic microvascular ischemic change. Vascular: No hyperdense vessel. Vascular calcification not unexpected for age. Skull: Normal. Negative for fracture or focal lesion. Sinuses/Orbits: No acute finding. Other: None.  Compared with priors, similar appearance. IMPRESSION: Negative exam. Electronically Signed   By: Staci Righter M.D.   On: 10/14/2016 14:50    ASSESSMENT: Iron deficiency anemia.  PLAN:    1. Iron deficiency anemia:  Patient expressed understanding and was in agreement with this plan. She also understands that She can call clinic at any time with any questions, concerns, or complaints.   Cancer Staging No matching staging information was found for the patient.  Lloyd Huger, MD   11/06/2016 10:56 PM

## 2016-11-07 ENCOUNTER — Inpatient Hospital Stay: Payer: Medicare Other | Admitting: Oncology

## 2016-11-08 ENCOUNTER — Inpatient Hospital Stay: Payer: Medicare Other | Admitting: Oncology

## 2016-11-20 NOTE — Progress Notes (Signed)
[place]  [phone] [phone]) [phone]  ID: Neil Crouch OB: 14-Jun-1932  MR#: [id number]  VEL#:[id number]  Patient Care Team: No Pcp Per Patient as PCP - General (General Practice)  CHIEF COMPLAINT: Iron deficiency anemia.  INTERVAL HISTORY: Patient initially evaluated as an inpatient on October 15, 2016. She returns to clinic today for repeat laboratory work and further evaluation. She has baseline dementia and much of the history is given by her family members. She currently feels well and is asymptomatic. She has no neurologic complaint. She denies any recent fevers or illnesses. She is good appetite and denies weight loss. She has no chest pain or shortness of breath. She denies any weakness or fatigue. She has no nausea, vomiting, constipation, or diarrhea. She has no melena or hematochezia. Patient offers no specific complaints today.  REVIEW OF SYSTEMS:   Review of Systems  Constitutional: Negative.  Negative for chills, fever, malaise/fatigue and weight loss.  Respiratory: Negative.  Negative for cough and shortness of breath.   Cardiovascular: Negative for chest pain and leg swelling.  Gastrointestinal: Negative.  Negative for abdominal pain, blood in stool and melena.  Neurological: Negative.  Negative for weakness.  Psychiatric/Behavioral: Positive for memory loss. The patient is not nervous/anxious.     As per HPI. Otherwise, a complete review of systems is negative.  PAST MEDICAL HISTORY: Past Medical History:  Diagnosis Date  . COPD (chronic obstructive pulmonary disease) ([place])   . Dementia   . Diabetes mellitus ([place])   . Hypertension     PAST SURGICAL HISTORY: Past Surgical History:  Procedure Laterality Date  . ESOPHAGOGASTRODUODENOSCOPY (EGD) WITH PROPOFOL N/A 10/16/2016   Procedure: ESOPHAGOGASTRODUODENOSCOPY (EGD) WITH PROPOFOL;  Surgeon: San Jetty, MD;  Location: [facility] ENDOSCOPY;  Service: Gastroenterology;   Laterality: N/A;    FAMILY HISTORY: Family History  Problem Relation Age of Onset  . Hypertension Other     ADVANCED DIRECTIVES (Y/N):  N  HEALTH MAINTENANCE: Social History  Substance Use Topics  . Smoking status: Never Smoker  . Smokeless tobacco: Former Systems developer  . Alcohol use No     Colonoscopy:  PAP:  Bone density:  Lipid panel:  No Known Allergies  Current Outpatient Prescriptions  Medication Sig Dispense Refill  . acetaminophen (TYLENOL) 500 MG tablet Take 500 mg by mouth 3 (three) times daily as needed.    . ALPRAZolam (XANAX) 0.25 MG tablet Take 1 tablet (0.25 mg total) by mouth 3 (three) times daily as needed for anxiety. 30 tablet 0  . atorvastatin (LIPITOR) 20 MG tablet Take 1 tablet (20 mg total) by mouth daily. 30 tablet 0  . Cranberry 425 MG CAPS Take 425 mg by mouth daily.    Marland Kitchen docusate sodium (COLACE) 100 MG capsule Take 1 capsule (100 mg total) by mouth 2 (two) times daily as needed for mild constipation. 10 capsule 0  . feeding supplement, ENSURE ENLIVE, (ENSURE ENLIVE) LIQD Take 237 mLs by mouth 2 (two) times daily between meals. 237 mL 12  . fexofenadine (ALLEGRA) 180 MG tablet Take 180 mg by mouth daily.    Marland Kitchen guaifenesin (ROBITUSSIN) 100 MG/5ML syrup Take 200 mg by mouth 3 (three) times daily as needed for cough.    . lactose free nutrition (BOOST) LIQD Take 237 mLs by mouth 2 (two) times daily between meals.    Marland Kitchen loratadine (CLARITIN) 10 MG tablet Take 10 mg by mouth daily.    . Melatonin 10 MG TABS Take 10 mg by mouth at  bedtime.    . memantine (NAMENDA XR) 28 MG CP24 24 hr capsule Take 28 mg by mouth at bedtime.    . metoprolol tartrate (LOPRESSOR) 25 MG tablet Take 1 tablet (25 mg total) by mouth 2 (two) times daily. 60 tablet 6  . pantoprazole (PROTONIX) 40 MG tablet Take 1 tablet (40 mg total) by mouth daily. 30 tablet 6  . QUEtiapine (SEROQUEL) 25 MG tablet Take 0.5 tablets (12.5 mg total) by mouth daily before supper. 15 tablet 6   No current  facility-administered medications for this visit.     OBJECTIVE: Vitals:   11/21/16 1532  BP: (!) 148/84  Pulse: 71  Resp: 18  Temp: 97.9 F (36.6 C)     Body mass index is 19.93 kg/m.    ECOG FS:1 - Symptomatic but completely ambulatory  General: Well-developed, well-nourished, no acute distress. Eyes: Pink conjunctiva, anicteric sclera. HEENT: Normocephalic, moist mucous membranes, clear oropharnyx. Lungs: Clear to auscultation bilaterally. Heart: Regular rate and rhythm. No rubs, murmurs, or gallops. Abdomen: Soft, nontender, nondistended. No organomegaly noted, normoactive bowel sounds. Musculoskeletal: No edema, cyanosis, or clubbing. Neuro: Alert, answering all questions appropriately. Cranial nerves grossly intact. Skin: No rashes or petechiae noted. Psych: Normal affect. Lymphatics: No cervical, calvicular, axillary or inguinal LAD.   LAB RESULTS:  Lab Results  Component Value Date   NA 141 10/15/2016   K 3.9 10/15/2016   CL 110 10/15/2016   CO2 24 10/15/2016   GLUCOSE 87 10/15/2016   BUN 13 10/15/2016   CREATININE 0.78 10/15/2016   CALCIUM 8.6 (L) 10/15/2016   PROT 6.2 (L) 10/14/2016   ALBUMIN 3.3 (L) 10/14/2016   AST 20 10/14/2016   ALT 12 (L) 10/14/2016   ALKPHOS 111 10/14/2016   BILITOT 0.3 10/14/2016   GFRNONAA >60 10/15/2016   GFRAA >60 10/15/2016    Lab Results  Component Value Date   WBC 8.3 11/21/2016   NEUTROABS 5.7 11/21/2016   HGB 9.1 (L) 11/21/2016   HCT 29.2 (L) 11/21/2016   MCV 63.0 (L) 11/21/2016   PLT 1,288 (HH) 11/21/2016   Lab Results  Component Value Date   IRON 14 (L) 11/21/2016   TIBC 418 11/21/2016   IRONPCTSAT 3 (L) 11/21/2016   Lab Results  Component Value Date   FERRITIN 9 (L) 11/21/2016     STUDIES: No results found.  ASSESSMENT: Iron deficiency anemia, thrombocytosis  PLAN:    1. Iron deficiency anemia: Patient's hemoglobin has improved since her admission, but still decreased. Her iron stores are also  significantly decreased. Patient will return to clinic on April 2nd and then again on April 10th to receive 510 mg IV Feraheme. She will then return to clinic in 3 months with repeat laboratory work and further evaluation. If her hemoglobin does not improve with iron infusions, will consider further anemia workup at that point. 2. Thrombocytosis: Likely secondary to iron deficiency anemia. If does not resolve with iron infusion, will consider additional workup in the future.  Approximately 30 minutes was spent in discussion of which greater than 50% was consultation.  Patient expressed understanding and was in agreement with this plan. She also understands that She can call clinic at any time with any questions, concerns, or complaints.    Lloyd Huger, MD   11/27/2016 4:41 PM

## 2016-11-21 ENCOUNTER — Inpatient Hospital Stay: Payer: Medicare Other

## 2016-11-21 ENCOUNTER — Inpatient Hospital Stay: Payer: Medicare Other | Attending: Oncology | Admitting: Oncology

## 2016-11-21 VITALS — BP 148/84 | HR 71 | Temp 97.9°F | Resp 18 | Wt 105.5 lb

## 2016-11-21 DIAGNOSIS — Z79899 Other long term (current) drug therapy: Secondary | ICD-10-CM | POA: Diagnosis not present

## 2016-11-21 DIAGNOSIS — I10 Essential (primary) hypertension: Secondary | ICD-10-CM | POA: Diagnosis not present

## 2016-11-21 DIAGNOSIS — D473 Essential (hemorrhagic) thrombocythemia: Secondary | ICD-10-CM

## 2016-11-21 DIAGNOSIS — F0390 Unspecified dementia without behavioral disturbance: Secondary | ICD-10-CM | POA: Insufficient documentation

## 2016-11-21 DIAGNOSIS — D509 Iron deficiency anemia, unspecified: Secondary | ICD-10-CM | POA: Insufficient documentation

## 2016-11-21 DIAGNOSIS — D500 Iron deficiency anemia secondary to blood loss (chronic): Secondary | ICD-10-CM

## 2016-11-21 DIAGNOSIS — E119 Type 2 diabetes mellitus without complications: Secondary | ICD-10-CM | POA: Insufficient documentation

## 2016-11-21 DIAGNOSIS — J449 Chronic obstructive pulmonary disease, unspecified: Secondary | ICD-10-CM | POA: Diagnosis not present

## 2016-11-21 LAB — CBC WITH DIFFERENTIAL/PLATELET
BASOS ABS: 0.1 10*3/uL (ref 0–0.1)
BASOS PCT: 1 %
EOS PCT: 1 %
Eosinophils Absolute: 0.1 10*3/uL (ref 0–0.7)
HEMATOCRIT: 29.2 % — AB (ref 35.0–47.0)
HEMOGLOBIN: 9.1 g/dL — AB (ref 12.0–16.0)
Lymphocytes Relative: 20 %
Lymphs Abs: 1.7 10*3/uL (ref 1.0–3.6)
MCH: 19.7 pg — ABNORMAL LOW (ref 26.0–34.0)
MCHC: 31.3 g/dL — AB (ref 32.0–36.0)
MCV: 63 fL — ABNORMAL LOW (ref 80.0–100.0)
MONO ABS: 0.8 10*3/uL (ref 0.2–0.9)
MONOS PCT: 10 %
NEUTROS ABS: 5.7 10*3/uL (ref 1.4–6.5)
Neutrophils Relative %: 68 %
Platelets: 1288 10*3/uL (ref 150–400)
RBC: 4.64 MIL/uL (ref 3.80–5.20)
RDW: 24.7 % — ABNORMAL HIGH (ref 11.5–14.5)
WBC: 8.3 10*3/uL (ref 3.6–11.0)

## 2016-11-21 LAB — IRON AND TIBC
IRON: 14 ug/dL — AB (ref 28–170)
Saturation Ratios: 3 % — ABNORMAL LOW (ref 10.4–31.8)
TIBC: 418 ug/dL (ref 250–450)
UIBC: 404 ug/dL

## 2016-11-21 LAB — FERRITIN: Ferritin: 9 ng/mL — ABNORMAL LOW (ref 11–307)

## 2016-11-21 NOTE — Progress Notes (Signed)
Hospital follow up for anemia. Offers no complaints.

## 2016-11-27 ENCOUNTER — Other Ambulatory Visit: Payer: Self-pay | Admitting: Oncology

## 2016-11-27 DIAGNOSIS — D500 Iron deficiency anemia secondary to blood loss (chronic): Secondary | ICD-10-CM | POA: Insufficient documentation

## 2016-11-28 ENCOUNTER — Inpatient Hospital Stay: Payer: Medicare Other | Attending: Oncology

## 2016-11-28 VITALS — BP 137/78 | HR 67 | Temp 98.7°F | Resp 22

## 2016-11-28 DIAGNOSIS — D473 Essential (hemorrhagic) thrombocythemia: Secondary | ICD-10-CM | POA: Diagnosis not present

## 2016-11-28 DIAGNOSIS — D500 Iron deficiency anemia secondary to blood loss (chronic): Secondary | ICD-10-CM | POA: Diagnosis present

## 2016-11-28 MED ORDER — SODIUM CHLORIDE 0.9 % IV SOLN
Freq: Once | INTRAVENOUS | Status: AC
  Administered 2016-11-28: 15:00:00 via INTRAVENOUS
  Filled 2016-11-28: qty 1000

## 2016-11-28 MED ORDER — SODIUM CHLORIDE 0.9 % IV SOLN
510.0000 mg | Freq: Once | INTRAVENOUS | Status: AC
  Administered 2016-11-28: 510 mg via INTRAVENOUS
  Filled 2016-11-28: qty 17

## 2016-12-06 ENCOUNTER — Other Ambulatory Visit: Payer: Self-pay

## 2016-12-06 ENCOUNTER — Inpatient Hospital Stay: Payer: Medicare Other

## 2016-12-06 ENCOUNTER — Observation Stay
Admission: EM | Admit: 2016-12-06 | Discharge: 2016-12-07 | Disposition: A | Discharge status: Nursing Home (Includes ICF) | Payer: Medicare Other | Attending: Internal Medicine | Admitting: Internal Medicine

## 2016-12-06 VITALS — BP 126/75 | HR 51 | Temp 98.4°F | Resp 12

## 2016-12-06 DIAGNOSIS — J449 Chronic obstructive pulmonary disease, unspecified: Secondary | ICD-10-CM | POA: Diagnosis not present

## 2016-12-06 DIAGNOSIS — N390 Urinary tract infection, site not specified: Secondary | ICD-10-CM

## 2016-12-06 DIAGNOSIS — Z8673 Personal history of transient ischemic attack (TIA), and cerebral infarction without residual deficits: Secondary | ICD-10-CM | POA: Diagnosis not present

## 2016-12-06 DIAGNOSIS — D500 Iron deficiency anemia secondary to blood loss (chronic): Secondary | ICD-10-CM

## 2016-12-06 DIAGNOSIS — N3000 Acute cystitis without hematuria: Secondary | ICD-10-CM | POA: Diagnosis not present

## 2016-12-06 DIAGNOSIS — R55 Syncope and collapse: Principal | ICD-10-CM | POA: Insufficient documentation

## 2016-12-06 DIAGNOSIS — I10 Essential (primary) hypertension: Secondary | ICD-10-CM | POA: Insufficient documentation

## 2016-12-06 DIAGNOSIS — Z79899 Other long term (current) drug therapy: Secondary | ICD-10-CM | POA: Diagnosis not present

## 2016-12-06 DIAGNOSIS — E119 Type 2 diabetes mellitus without complications: Secondary | ICD-10-CM | POA: Insufficient documentation

## 2016-12-06 DIAGNOSIS — F0390 Unspecified dementia without behavioral disturbance: Secondary | ICD-10-CM | POA: Diagnosis not present

## 2016-12-06 LAB — URINALYSIS, COMPLETE (UACMP) WITH MICROSCOPIC
BILIRUBIN URINE: NEGATIVE
Bacteria, UA: NONE SEEN
GLUCOSE, UA: NEGATIVE mg/dL
Hgb urine dipstick: NEGATIVE
KETONES UR: NEGATIVE mg/dL
NITRITE: NEGATIVE
PH: 5 (ref 5.0–8.0)
Protein, ur: 100 mg/dL — AB
SPECIFIC GRAVITY, URINE: 1.019 (ref 1.005–1.030)
Squamous Epithelial / LPF: NONE SEEN

## 2016-12-06 LAB — CBC WITH DIFFERENTIAL/PLATELET
BASOS ABS: 0.1 10*3/uL (ref 0–0.1)
BASOS PCT: 1 %
Eosinophils Absolute: 0.1 10*3/uL (ref 0–0.7)
Eosinophils Relative: 1 %
HEMATOCRIT: 34.1 % — AB (ref 35.0–47.0)
Hemoglobin: 10.6 g/dL — ABNORMAL LOW (ref 12.0–16.0)
LYMPHS PCT: 24 %
Lymphs Abs: 2.2 10*3/uL (ref 1.0–3.6)
MCH: 20.9 pg — ABNORMAL LOW (ref 26.0–34.0)
MCHC: 31 g/dL — ABNORMAL LOW (ref 32.0–36.0)
MCV: 67.3 fL — AB (ref 80.0–100.0)
MONO ABS: 0.4 10*3/uL (ref 0.2–0.9)
Monocytes Relative: 4 %
NEUTROS ABS: 6.5 10*3/uL (ref 1.4–6.5)
NEUTROS PCT: 70 %
Platelets: 614 10*3/uL — ABNORMAL HIGH (ref 150–440)
RBC: 5.07 MIL/uL (ref 3.80–5.20)
RDW: 25.1 % — AB (ref 11.5–14.5)
WBC: 9.2 10*3/uL (ref 3.6–11.0)

## 2016-12-06 LAB — COMPREHENSIVE METABOLIC PANEL
ALBUMIN: 3.4 g/dL — AB (ref 3.5–5.0)
ALT: 22 U/L (ref 14–54)
ANION GAP: 7 (ref 5–15)
AST: 38 U/L (ref 15–41)
Alkaline Phosphatase: 121 U/L (ref 38–126)
BILIRUBIN TOTAL: 0.4 mg/dL (ref 0.3–1.2)
BUN: 13 mg/dL (ref 6–20)
CHLORIDE: 110 mmol/L (ref 101–111)
CO2: 26 mmol/L (ref 22–32)
Calcium: 8.6 mg/dL — ABNORMAL LOW (ref 8.9–10.3)
Creatinine, Ser: 0.82 mg/dL (ref 0.44–1.00)
GFR calc Af Amer: >60 mL/min (ref >60)
Glucose, Bld: 115 mg/dL — ABNORMAL HIGH (ref 65–99)
POTASSIUM: 3.9 mmol/L (ref 3.5–5.1)
Sodium: 143 mmol/L (ref 135–145)
TOTAL PROTEIN: 6.5 g/dL (ref 6.5–8.1)

## 2016-12-06 LAB — LIPASE, BLOOD: LIPASE: 21 U/L (ref 11–51)

## 2016-12-06 LAB — TROPONIN I
Troponin I: <0.03 ng/mL (ref <0.03)
Troponin I: 0.05 ng/mL (ref <0.03)

## 2016-12-06 LAB — GLUCOSE, CAPILLARY: Glucose-Capillary: 99 mg/dL (ref 65–99)

## 2016-12-06 LAB — LACTIC ACID, PLASMA: LACTIC ACID, VENOUS: 1.9 mmol/L (ref 0.5–1.9)

## 2016-12-06 MED ORDER — ACETAMINOPHEN 650 MG RE SUPP: 650.0000 mg | Freq: Four times a day (QID) | RECTAL | Status: DC | PRN

## 2016-12-06 MED ORDER — SODIUM CHLORIDE 0.9 % IV SOLN
510.0000 mg | Freq: Once | INTRAVENOUS | Status: AC
  Administered 2016-12-06: 510 mg via INTRAVENOUS
  Filled 2016-12-06: qty 17

## 2016-12-06 MED ORDER — ENOXAPARIN SODIUM 40 MG/0.4ML ~~LOC~~ SOLN
40.0000 mg | SUBCUTANEOUS | Status: DC
  Administered 2016-12-06: 40 mg via SUBCUTANEOUS
  Filled 2016-12-06: qty 0.4

## 2016-12-06 MED ORDER — SODIUM CHLORIDE 0.9 % IV BOLUS (SEPSIS)
1000.0000 mL | Freq: Once | INTRAVENOUS | Status: AC
  Administered 2016-12-06: 1000 mL via INTRAVENOUS

## 2016-12-06 MED ORDER — LORATADINE 10 MG PO TABS
10.0000 mg | ORAL_TABLET | Freq: Every day | ORAL | Status: DC
  Administered 2016-12-07: 10 mg via ORAL
  Filled 2016-12-06: qty 1

## 2016-12-06 MED ORDER — CEFTRIAXONE SODIUM-DEXTROSE 1-3.74 GM-% IV SOLR
1.0000 g | Freq: Once | INTRAVENOUS | Status: AC
  Administered 2016-12-06: 1 g via INTRAVENOUS
  Filled 2016-12-06: qty 50

## 2016-12-06 MED ORDER — DEXTROSE 5 % IV SOLN: 1.0000 g | Freq: Once | INTRAVENOUS | Status: DC

## 2016-12-06 MED ORDER — ACETAMINOPHEN 325 MG PO TABS: 650.0000 mg | ORAL_TABLET | Freq: Four times a day (QID) | ORAL | Status: DC | PRN

## 2016-12-06 MED ORDER — CEPHALEXIN 500 MG PO CAPS
ORAL_CAPSULE | ORAL | Status: AC
  Filled 2016-12-06: qty 1

## 2016-12-06 MED ORDER — QUETIAPINE FUMARATE 25 MG PO TABS: 12.5000 mg | ORAL_TABLET | Freq: Every day | ORAL | Status: DC

## 2016-12-06 MED ORDER — CRANBERRY 425 MG PO CAPS: 425.0000 mg | ORAL_CAPSULE | Freq: Every day | ORAL | Status: DC

## 2016-12-06 MED ORDER — ACETAMINOPHEN 500 MG PO TABS
500.0000 mg | ORAL_TABLET | Freq: Three times a day (TID) | ORAL | Status: DC | PRN
Start: 2016-12-06 — End: 2016-12-06

## 2016-12-06 MED ORDER — SODIUM CHLORIDE 0.9 % IV SOLN
Freq: Once | INTRAVENOUS | Status: AC
Start: 2016-12-06 — End: 2016-12-06
  Administered 2016-12-06: 15:00:00 via INTRAVENOUS
  Filled 2016-12-06: qty 1000

## 2016-12-06 MED ORDER — DOCUSATE SODIUM 100 MG PO CAPS
100.0000 mg | ORAL_CAPSULE | Freq: Two times a day (BID) | ORAL | Status: DC
  Administered 2016-12-06 – 2016-12-07 (×2): 100 mg via ORAL
  Filled 2016-12-06 (×2): qty 1

## 2016-12-06 MED ORDER — CEPHALEXIN 500 MG PO CAPS
500.0000 mg | ORAL_CAPSULE | Freq: Two times a day (BID) | ORAL | Status: DC
  Administered 2016-12-07: 500 mg via ORAL
  Filled 2016-12-06: qty 1

## 2016-12-06 MED ORDER — TRAMADOL HCL 50 MG PO TABS: 50.0000 mg | ORAL_TABLET | Freq: Four times a day (QID) | ORAL | Status: DC | PRN

## 2016-12-06 MED ORDER — PANTOPRAZOLE SODIUM 40 MG PO TBEC
40.0000 mg | DELAYED_RELEASE_TABLET | Freq: Every day | ORAL | Status: DC
  Administered 2016-12-07: 40 mg via ORAL
  Filled 2016-12-06: qty 1

## 2016-12-06 MED ORDER — FLUTICASONE PROPIONATE 50 MCG/ACT NA SUSP
2.0000 | Freq: Every day | NASAL | Status: DC
  Filled 2016-12-06: qty 16

## 2016-12-06 MED ORDER — METOPROLOL TARTRATE 25 MG PO TABS
25.0000 mg | ORAL_TABLET | Freq: Two times a day (BID) | ORAL | Status: DC
  Administered 2016-12-06 – 2016-12-07 (×2): 25 mg via ORAL
  Filled 2016-12-06 (×2): qty 1

## 2016-12-06 MED ORDER — ONDANSETRON HCL 4 MG/2ML IJ SOLN: 4.0000 mg | Freq: Four times a day (QID) | INTRAMUSCULAR | Status: DC | PRN

## 2016-12-06 MED ORDER — ATORVASTATIN CALCIUM 20 MG PO TABS
20.0000 mg | ORAL_TABLET | Freq: Every day | ORAL | Status: DC
  Administered 2016-12-07: 20 mg via ORAL
  Filled 2016-12-06: qty 1

## 2016-12-06 MED ORDER — DOCUSATE SODIUM 100 MG PO CAPS: 100.0000 mg | ORAL_CAPSULE | Freq: Two times a day (BID) | ORAL | Status: DC | PRN

## 2016-12-06 MED ORDER — SODIUM CHLORIDE 0.9 % IV SOLN: Freq: Once | INTRAVENOUS | Status: DC

## 2016-12-06 MED ORDER — ALPRAZOLAM 0.25 MG PO TABS: 0.2500 mg | ORAL_TABLET | Freq: Three times a day (TID) | ORAL | Status: DC | PRN

## 2016-12-06 MED ORDER — MEMANTINE HCL ER 14 MG PO CP24
28.0000 mg | ORAL_CAPSULE | Freq: Every day | ORAL | Status: DC
  Administered 2016-12-07: 28 mg via ORAL
  Filled 2016-12-06: qty 2

## 2016-12-06 MED ORDER — ONDANSETRON HCL 4 MG PO TABS: 4.0000 mg | ORAL_TABLET | Freq: Four times a day (QID) | ORAL | Status: DC | PRN

## 2016-12-06 MED ORDER — MELATONIN 5 MG PO TABS
10.0000 mg | ORAL_TABLET | Freq: Every day | ORAL | Status: DC
  Administered 2016-12-06: 10 mg via ORAL
  Filled 2016-12-06 (×2): qty 2

## 2016-12-06 NOTE — Progress Notes (Signed)
1440-Pt's caregiver called staff over, pt seen slumped over in chair. Unresponsive. IV iron stopped, IVF increased. Sats 80%, faint carotid pulse. Seizure like activity noted.  Code called. O2 applied, sats increased to 96%. Unable to obtain BP. Code team arrived, pt transported to stretcher.Pt became responsive upon moving to stretcher. BP rechecked. 126/75, pulse 51. Transported to ED via stretcher by ED staff. Caregiver with patient.

## 2016-12-06 NOTE — ED Notes (Signed)
Attempted to call report and was advised the nurse would call me back

## 2016-12-06 NOTE — ED Provider Notes (Signed)
[facility] Emergency Department Provider Note  ____________________________________________  Time seen: Approximately 3:11 PM  I have reviewed the triage vital signs and the nursing notes.   HISTORY  Chief Complaint Altered Mental Status  Level 5 caveat:  Portions of the history and physical were unable to be obtained due to the patient's altered mental status   HPI Savannah Young is a 83 y.o. female brought to the ED from infusion center.  Infusion center staff noticed that while the patient was receiving an iron infusion she suddenly became unresponsive. They had just begun infusion and she had not received any significant amount of the infusion before she suddenly became unresponsive and the staff felt like she was in cardiac arrest. They did not do any CPR, and she seemed to recover spontaneous circulation quickly. She has had one iron infusion in the past without any acute reaction.    Past Medical History:  Diagnosis Date  . COPD (chronic obstructive pulmonary disease) ([place])   . Dementia   . Diabetes mellitus ([place])   . Hypertension      Patient Active Problem List   Diagnosis Date Noted  . Iron deficiency anemia due to chronic blood loss 11/27/2016  . Encounter for blood transfusion 10/16/2016  . Abnormal findings on esophagogastroduodenoscopy (EGD) 10/16/2016  . Angiodysplasia of stomach and duodenum with bleeding 10/16/2016  . Thrombocytosis ([place]) 10/16/2016  . SVT (supraventricular tachycardia) ([place]) 10/16/2016  . Iron deficiency anemia 10/14/2016  . Thrombocythemia ([place]) 10/14/2016  . TIA (transient ischemic attack) 10/14/2016  . Encephalopathy acute 03/07/2016  . Pressure ulcer 02/23/2016  . Confusion 02/22/2016  . Agitation 01/20/2016     Past Surgical History:  Procedure Laterality Date  . ESOPHAGOGASTRODUODENOSCOPY (EGD) WITH PROPOFOL N/A 10/16/2016   Procedure: ESOPHAGOGASTRODUODENOSCOPY (EGD) WITH PROPOFOL;  Surgeon:  San Jetty, MD;  Location: [facility] ENDOSCOPY;  Service: Gastroenterology;  Laterality: N/A;     Prior to Admission medications   Medication Sig Start Date End Date Taking? Authorizing Provider  acetaminophen (TYLENOL) 500 MG tablet Take 500 mg by mouth 3 (three) times daily as needed.    Historical Provider, MD  ALPRAZolam Duanne Moron) 0.25 MG tablet Take 1 tablet (0.25 mg total) by mouth 3 (three) times daily as needed for anxiety. 10/16/16   Theodoro Grist, MD  atorvastatin (LIPITOR) 20 MG tablet Take 1 tablet (20 mg total) by mouth daily. 02/24/16   Theodoro Grist, MD  Cranberry 425 MG CAPS Take 425 mg by mouth daily.    Historical Provider, MD  docusate sodium (COLACE) 100 MG capsule Take 1 capsule (100 mg total) by mouth 2 (two) times daily as needed for mild constipation. 10/16/16   Theodoro Grist, MD  feeding supplement, ENSURE ENLIVE, (ENSURE ENLIVE) LIQD Take 237 mLs by mouth 2 (two) times daily between meals. 03/09/16   Fritzi Mandes, MD  fexofenadine (ALLEGRA) 180 MG tablet Take 180 mg by mouth daily.    Historical Provider, MD  guaifenesin (ROBITUSSIN) 100 MG/5ML syrup Take 200 mg by mouth 3 (three) times daily as needed for cough.    Historical Provider, MD  lactose free nutrition (BOOST) LIQD Take 237 mLs by mouth 2 (two) times daily between meals.    Historical Provider, MD  loratadine (CLARITIN) 10 MG tablet Take 10 mg by mouth daily.    Historical Provider, MD  Melatonin 10 MG TABS Take 10 mg by mouth at bedtime.    Historical Provider, MD  memantine (NAMENDA XR) 28 MG CP24 24 hr capsule  Take 28 mg by mouth at bedtime.    Historical Provider, MD  metoprolol tartrate (LOPRESSOR) 25 MG tablet Take 1 tablet (25 mg total) by mouth 2 (two) times daily. 10/16/16   Theodoro Grist, MD  pantoprazole (PROTONIX) 40 MG tablet Take 1 tablet (40 mg total) by mouth daily. 10/16/16   Theodoro Grist, MD  QUEtiapine (SEROQUEL) 25 MG tablet Take 0.5 tablets (12.5 mg total) by mouth daily before supper. 10/16/16   Theodoro Grist, MD     Allergies Patient has no known allergies.   Family History  Problem Relation Age of Onset  . Hypertension Other     Social History Social History  Substance Use Topics  . Smoking status: Never Smoker  . Smokeless tobacco: Former Systems developer  . Alcohol use No    Review of Systems Unable to obtain due to altered mental status ____________________________________________   PHYSICAL EXAM:  VITAL SIGNS: ED Triage Vitals  Enc Vitals Group     BP 12/06/16 1506 (!) 129/49     Pulse Rate 12/06/16 1506 63     Resp 12/06/16 1506 (!) 25     Temp --      Temp src --      SpO2 12/06/16 1506 95 %     Weight 12/06/16 1505 100 lb (45.4 kg)     Height 12/06/16 1505 5' (1.524 m)     Head Circumference --      Peak Flow --      Pain Score 12/06/16 1505 0     Pain Loc --      Pain Edu? --      Excl. in [place]? --     Vital signs reviewed, nursing assessments reviewed.   Constitutional:   Somnolent. Oriented.. Not in distress. Eyes:   No scleral icterus. No conjunctival pallor. PERRL. EOMI.  No nystagmus. ENT   Head:   Normocephalic and atraumatic.   Nose:   No congestion/rhinnorhea. No septal hematoma   Mouth/Throat:   MMM, no pharyngeal erythema. No peritonsillar mass.    Neck:   No stridor. No SubQ emphysema. No meningismus. Hematological/Lymphatic/Immunilogical:   No cervical lymphadenopathy. Cardiovascular:   RRR. Symmetric bilateral radial and DP pulses.  No murmurs.  Respiratory:   Normal respiratory effort without tachypnea nor retractions. Breath sounds are clear and equal bilaterally. No wheezes/rales/rhonchi. Gastrointestinal:   Soft and nontender. Non distended. There is no CVA tenderness.  No rebound, rigidity, or guarding. Genitourinary:   deferred Musculoskeletal:   Normal range of motion in all extremities. No joint effusions.  No lower extremity tenderness.  No edema. Neurologic:   Normal speech and language.  CN 2-10 normal. Motor  grossly intact. No gross focal neurologic deficits are appreciated.  Skin:    Skin is warm, dry and intact. No rash noted.  No petechiae, purpura, or bullae.  ____________________________________________    LABS (pertinent positives/negatives) (all labs ordered are listed, but only abnormal results are displayed) Labs Reviewed  COMPREHENSIVE METABOLIC PANEL - Abnormal; Notable for the following:       Result Value   Glucose, Bld 115 (*)    Calcium 8.6 (*)    Albumin 3.4 (*)    All other components within normal limits  CBC WITH DIFFERENTIAL/PLATELET - Abnormal; Notable for the following:    Hemoglobin 10.6 (*)    HCT 34.1 (*)    MCV 67.3 (*)    MCH 20.9 (*)    MCHC 31.0 (*)    RDW 25.1 (*)  Platelets 614 (*)    All other components within normal limits  URINALYSIS, COMPLETE (UACMP) WITH MICROSCOPIC - Abnormal; Notable for the following:    Color, Urine YELLOW (*)    APPearance CLOUDY (*)    Protein, ur 100 (*)    Leukocytes, UA SMALL (*)    All other components within normal limits  URINE CULTURE  LIPASE, BLOOD  TROPONIN I  LACTIC ACID, PLASMA  GLUCOSE, CAPILLARY  LACTIC ACID, PLASMA   ____________________________________________   EKG  Interpreted by me Sinus rhythm rate of 61, normal axis and intervals. No acute ischemic changes.  ____________________________________________    RADIOLOGY  No results found.  ____________________________________________   PROCEDURES Procedures  ____________________________________________   INITIAL IMPRESSION / ASSESSMENT AND PLAN / ED COURSE  Pertinent labs & imaging results that were available during my care of the patient were reviewed by me and considered in my medical decision making (see chart for details).  Patient presents with loss of consciousness, likely syncope versus a cardiac event. No abnormal cardiac activity on telemetry monitor in the ED. Labs are unremarkable. Urinalysis is consistent with UTI.  No evidence of allergic reaction or sepsis. Vital signs unremarkable. Ceftriaxone ordered, IV fluids for hydration. Case discussed with hospitalist for further observation for possible dysrhythmia.         ____________________________________________   FINAL CLINICAL IMPRESSION(S) / ED DIAGNOSES  Final diagnoses:  Syncope, unspecified syncope type  Lower urinary tract infectious disease      New Prescriptions   No medications on file     Portions of this note were generated with dragon dictation software. Dictation errors may occur despite best attempts at proofreading.    Carrie Mew, MD 12/06/16 [phone]

## 2016-12-06 NOTE — ED Notes (Signed)
In and out cath performed by this nurse with [facility] ED Medic assisting

## 2016-12-06 NOTE — Progress Notes (Signed)
PHARMACIST - PHYSICIAN ORDER COMMUNICATION  CONCERNING: P&T Medication Policy on Herbal Medications  DESCRIPTION:  This patient's order for:  CRANBERRY CAPS 425mg   has been noted.  This product(s) is classified as an "herbal" or natural product. Due to a lack of definitive safety studies or FDA approval, nonstandard manufacturing practices, plus the potential risk of unknown drug-drug interactions while on inpatient medications, the Pharmacy and Therapeutics Committee does not permit the use of "herbal" or natural products of this type within [facility].   ACTION TAKEN: The pharmacy department is unable to verify this order at this time and your patient has been informed of this safety policy. Please reevaluate patient's clinical condition at discharge and address if the herbal or natural product(s) should be resumed at that time.  Pernell Dupre, PharmD, BCPS Clinical Pharmacist 12/06/2016 8:31 PM

## 2016-12-06 NOTE — ED Provider Notes (Signed)
[place]  Department of Emergency Medicine   Code Blue CONSULT NOTE  Chief Complaint: Cardiac arrest/unresponsive   Level V Caveat: Unresponsive  History of present illness: I was contacted by the hospital for a CODE BLUE cardiac arrest upstairs in the [facility] infusion center and presented to the patient's bedside.   On arrival, patient is breathing spontaneously, had spontaneous circulation, but somnolent.  Infusion center staff noticed the patient was receiving an iron infusion. They had just begun infusion and she had not received any significant amount of the infusion before she suddenly became unresponsive and family felt like she was in cardiac arrest. They did not do any CPR, and she seemed to recover spontaneous circulation quickly. She has had one iron infusion in the past without any acute reaction.  ROS: Unable to obtain, Level V caveat  Scheduled Meds: Continuous Infusions: . sodium chloride     PRN Meds:. Past Medical History:  Diagnosis Date  . COPD (chronic obstructive pulmonary disease) ([place])   . Dementia   . Diabetes mellitus ([place])   . Hypertension    Past Surgical History:  Procedure Laterality Date  . ESOPHAGOGASTRODUODENOSCOPY (EGD) WITH PROPOFOL N/A 10/16/2016   Procedure: ESOPHAGOGASTRODUODENOSCOPY (EGD) WITH PROPOFOL;  Surgeon: San Jetty, MD;  Location: [facility] ENDOSCOPY;  Service: Gastroenterology;  Laterality: N/A;   Social History   Social History  . Marital status: Widowed    Spouse name: N/A  . Number of children: N/A  . Years of education: N/A   Occupational History  . Not on file.   Social History Main Topics  . Smoking status: Never Smoker  . Smokeless tobacco: Former Systems developer  . Alcohol use No  . Drug use: Unknown  . Sexual activity: Not on file   Other Topics Concern  . Not on file   Social History Narrative   From [facility]   No Known Allergies  Last set of Vital Signs (not  current) Vitals:   12/06/16 1506  BP: (!) 129/49  Pulse: 63  Resp: (!) 25      Physical Exam  Gen: Somnolent Cardiovascular: Regular rate rhythm Resp: Spontaneous respirations, clear breath sounds, symmetric  Abd: nondistended   Procedures    Medical Decision making  Patient had apparent syncope episode. Per cancer center staff they felt that she had suffered cardiac arrest. However, by my arrival to the bedside at about 2:50 PM, patient has spontaneous circulation and spontaneous respirations and mental status is impaired but seemed to be improving.   Assessment and Plan  Transfer to ED for further assessment and management by request of cancer Center M.D. Dr. Mike Gip.    Carrie Mew, MD 12/06/16 [phone]

## 2016-12-06 NOTE — H&P (Addendum)
[facility] NAME: Savannah Young    MR#:  [id number]  DATE OF BIRTH:  1932-07-31  DATE OF ADMISSION:  12/06/2016  PRIMARY CARE PHYSICIAN: Bonnita Nasuti, MD   REQUESTING/REFERRING PHYSICIAN: Dr Joni Fears  CHIEF COMPLAINT:  Past out today at the cancer center  HISTORY OF PRESENT ILLNESS:  Savannah Young  is a 83 y.o. female with a known history ofDementia, hypertension, diabetes who is a resident at [facility] came to the [facility] for her second IV ferriheme infusion. Patient was brought in by caregiver who was sitting next to her "told me a few minutes into the infusion. Patient slumped over on her side and became unresponsive. She alarmed the nurse at the cancer center and [place] was called. Patient did not lose pulse did not have to do CPR by the time ER physician reached their patient was waking up and had was hemodynamically stable. Patient is awake and at her baseline according to the caregivers. She is a poor historian secondary to her dementia. Her EKG shows sinus rhythm vitals are stable she's being admitted for overnight observation.  PAST MEDICAL HISTORY:   Past Medical History:  Diagnosis Date  . COPD (chronic obstructive pulmonary disease) ([place])   . Dementia   . Diabetes mellitus ([place])   . Hypertension     PAST SURGICAL HISTOIRY:   Past Surgical History:  Procedure Laterality Date  . ESOPHAGOGASTRODUODENOSCOPY (EGD) WITH PROPOFOL N/A 10/16/2016   Procedure: ESOPHAGOGASTRODUODENOSCOPY (EGD) WITH PROPOFOL;  Surgeon: San Jetty, MD;  Location: [facility] ENDOSCOPY;  Service: Gastroenterology;  Laterality: N/A;    SOCIAL HISTORY:   Social History  Substance Use Topics  . Smoking status: Never Smoker  . Smokeless tobacco: Former Systems developer  . Alcohol use No    FAMILY HISTORY:   Family History  Problem Relation Age of Onset  . Hypertension Other     DRUG ALLERGIES:  No Known  Allergies  REVIEW OF SYSTEMS:  Review of Systems  Unable to perform ROS: Dementia     MEDICATIONS AT HOME:   Prior to Admission medications   Medication Sig Start Date End Date Taking? Authorizing Provider  acetaminophen (TYLENOL) 500 MG tablet Take 500 mg by mouth 3 (three) times daily as needed.    Historical Provider, MD  ALPRAZolam Duanne Moron) 0.25 MG tablet Take 1 tablet (0.25 mg total) by mouth 3 (three) times daily as needed for anxiety. 10/16/16   Theodoro Grist, MD  atorvastatin (LIPITOR) 20 MG tablet Take 1 tablet (20 mg total) by mouth daily. 02/24/16   Theodoro Grist, MD  Cranberry 425 MG CAPS Take 425 mg by mouth daily.    Historical Provider, MD  docusate sodium (COLACE) 100 MG capsule Take 1 capsule (100 mg total) by mouth 2 (two) times daily as needed for mild constipation. 10/16/16   Theodoro Grist, MD  feeding supplement, ENSURE ENLIVE, (ENSURE ENLIVE) LIQD Take 237 mLs by mouth 2 (two) times daily between meals. 03/09/16   Fritzi Mandes, MD  fexofenadine (ALLEGRA) 180 MG tablet Take 180 mg by mouth daily.    Historical Provider, MD  guaifenesin (ROBITUSSIN) 100 MG/5ML syrup Take 200 mg by mouth 3 (three) times daily as needed for cough.    Historical Provider, MD  lactose free nutrition (BOOST) LIQD Take 237 mLs by mouth 2 (two) times daily between meals.    Historical Provider, MD  loratadine (CLARITIN) 10 MG tablet Take 10 mg by mouth  daily.    Historical Provider, MD  Melatonin 10 MG TABS Take 10 mg by mouth at bedtime.    Historical Provider, MD  memantine (NAMENDA XR) 28 MG CP24 24 hr capsule Take 28 mg by mouth at bedtime.    Historical Provider, MD  metoprolol tartrate (LOPRESSOR) 25 MG tablet Take 1 tablet (25 mg total) by mouth 2 (two) times daily. 10/16/16   Theodoro Grist, MD  pantoprazole (PROTONIX) 40 MG tablet Take 1 tablet (40 mg total) by mouth daily. 10/16/16   Theodoro Grist, MD  QUEtiapine (SEROQUEL) 25 MG tablet Take 0.5 tablets (12.5 mg total) by mouth daily before  supper. 10/16/16   Theodoro Grist, MD      VITAL SIGNS:  Blood pressure (!) 129/49, pulse 63, resp. rate (!) 25, height 5' (1.524 m), weight 45.4 kg (100 lb), last menstrual period 01/20/2016, SpO2 95 %.  PHYSICAL EXAMINATION:  GENERAL:  83 y.o.-year-old patient lying in the bed with no acute distress.  EYES: Pupils equal, round, reactive to light and accommodation. No scleral icterus. Extraocular muscles intact.  HEENT: Head atraumatic, normocephalic. Oropharynx and nasopharynx clear.  NECK:  Supple, no jugular venous distention. No thyroid enlargement, no tenderness.  LUNGS: Normal breath sounds bilaterally, no wheezing, rales,rhonchi or crepitation. No use of accessory muscles of respiration.  CARDIOVASCULAR: S1, S2 normal. No murmurs, rubs, or gallops.  ABDOMEN: Soft, nontender, nondistended. Bowel sounds present. No organomegaly or mass.  EXTREMITIES: No pedal edema, cyanosis, or clubbing.  NEUROLOGIC: Cranial nerves II through XII are intact. Muscle strength 5/5 in all extremities. Sensation intact. Gait not checked.  PSYCHIATRIC: The patient is alert and oriented x 3.  SKIN: No obvious rash, lesion, or ulcer.   LABORATORY PANEL:   CBC  Recent Labs Lab 12/06/16 1506  WBC 9.2  HGB 10.6*  HCT 34.1*  PLT 614*   ------------------------------------------------------------------------------------------------------------------  Chemistries   Recent Labs Lab 12/06/16 1506  NA 143  K 3.9  CL 110  CO2 26  GLUCOSE 115*  BUN 13  CREATININE 0.82  CALCIUM 8.6*  AST 38  ALT 22  ALKPHOS 121  BILITOT 0.4   ------------------------------------------------------------------------------------------------------------------  Cardiac Enzymes  Recent Labs Lab 12/06/16 1506  TROPONINI <0.03   ------------------------------------------------------------------------------------------------------------------  RADIOLOGY:  No results found.  EKG:    IMPRESSION AND PLAN:    Savannah Young  is a 83 y.o. female with a known history ofDementia, hypertension, diabetes who is a resident at [facility] came to the [facility] for her second IV ferriheme infusion. Patient was brought in by caregiver who was sitting next to her "told me a few minutes into the infusion. Patient slumped over on her side and became unresponsive. She alarmed the nurse at the cancer center and [place] was called.   1. Syncopal episode etiology unclear. -CODE BLUE was called while patient was getting IV iron infusion patient did not lose pulse and no CPR was needed. She arouses on her own and is at baseline. She is being admitted for overnight telemetry monitoring and observation -Patient is asymptomatic -EKG normal sinus rhythm  2. Iron deficiency anemia -Follows at the cancer center for IV iron she took her first was of IV iron early part of April without any complications. -We'll defer further treatment as outpatient  3. Hypertension continue home meds  4. Diabetes continue sliding scale  5. Chronic dementia patient is a resident [facility]  6. DVT prophylaxis subcutaneous Lovenox  7. UTI as noted on  urinalysis patient is poor historian and is asymptomatic I will treated empirically with Keflex 500 twice a day for 5 days.  Above was discussed with [facility] director and patient's caregiver no family present. Patient is a full code according to her paperwork at [facility] this was relayed to me by the director of [facility].   All the records are reviewed and case discussed with ED provider. Management plans discussed with the patient, family and they are in agreement.  CODE STATUS: full  TOTAL TIME TAKING CARE OF THIS PATIENT: 45 minutes.    Danny Yackley M.D on 12/06/2016 at 5:20 PM  Between 7am to 6pm - Pager - [phone]  After 6pm go to [email or web address] - password [email or web address] [facility]  [facility]  Office   [phone]  CC: Primary care physician; Bonnita Nasuti, MD

## 2016-12-06 NOTE — ED Triage Notes (Signed)
Pt was in cancer center receiving 2nd infusion of Fe when she became unresponsive and pulseless - pt was stimulated by code team and aroused without starting cpr - at this time pt is back to base line per caregiver

## 2016-12-06 NOTE — Progress Notes (Signed)
Called ED for report. Nurse not available. Awaiting call back.

## 2016-12-06 NOTE — ED Notes (Signed)
Savannah Young [phone] - caregiver says to call her with any questions

## 2016-12-07 DIAGNOSIS — R55 Syncope and collapse: Secondary | ICD-10-CM | POA: Diagnosis not present

## 2016-12-07 LAB — TROPONIN I
Troponin I: 0.03 ng/mL (ref <0.03)
Troponin I: <0.03 ng/mL (ref <0.03)

## 2016-12-07 LAB — MRSA PCR SCREENING: MRSA by PCR: NEGATIVE

## 2016-12-07 MED ORDER — CEPHALEXIN 500 MG PO CAPS: 500.0000 mg | ORAL_CAPSULE | Freq: Two times a day (BID) | ORAL | 0 refills | Status: DC

## 2016-12-07 NOTE — Clinical Social Work Note (Signed)
Clinical Social Work Assessment  Patient Details  Name: Savannah Young MRN: [id number] Date of Birth: June 25, 1932  Date of referral:  12/07/16               Reason for consult:  Other (Comment Required) (From [facility] memory care )                Permission sought to share information with:    Permission granted to share information::     Name::        Agency::     Relationship::     Contact Information:     Housing/Transportation Living arrangements for the past 2 months:  [place] of Information:  Patient, Facility Patient Interpreter Needed:  None Criminal Activity/Legal Involvement Pertinent to Current Situation/Hospitalization:  No - Comment as needed Significant Relationships:  Siblings Lives with:  Facility Resident Do you feel safe going back to the place where you live?  Yes Need for family participation in patient care:  Yes (Comment)  Care giving concerns:  Patient is a long term care resident at [facility] memory care.    Social Worker assessment / plan:  Holiday representative (CSW) received consult that patient is from [facility]. PT is recommending no follow up. MD has discharged patient today. CSW contacted [facility] and made her aware of above. Per Thayer Headings patient can return to [facility] today and she will come to [facility] and pick her up. Per Thayer Headings patient is in the memory care unit and her family does not visit often because they are elderly. CSW met with patient and made her aware she is going back to [facility] today. Patient is agreeable to return to [facility]. D/C packet given to RN. Please reconsult if future social work needs arise. CSW signing off.   Employment status:  Disabled (Comment on whether or not currently receiving Disability) Insurance information:  Medicaid [place], [employer] PT Recommendations:  No Follow Up Information / Referral to community resources:  Other (Comment  Required) (Patient will return to ALF memory care )  Patient/Family's Response to care:  Patient is agreeable to D/C to [facility] today.   Patient/Family's Understanding of and Emotional Response to Diagnosis, Current Treatment, and Prognosis:  Patient was pleasant and thanked CSW for visit.   Emotional Assessment Appearance:  Appears stated age Attitude/Demeanor/Rapport:    Affect (typically observed):  Pleasant Orientation:  Oriented to Self, Fluctuating Orientation (Suspected and/or reported Sundowners) Alcohol / Substance use:  Not Applicable Psych involvement (Current and /or in the community):  No (Comment)  Discharge Needs  Concerns to be addressed:  Discharge Planning Concerns Readmission within the last 30 days:  No Current discharge risk:  None Barriers to Discharge:  No Barriers Identified   Embry Manrique, Veronia Beets, LCSW 12/07/2016, 3:54 PM

## 2016-12-07 NOTE — NC FL2 (Signed)
[place] LEVEL OF CARE SCREENING TOOL     IDENTIFICATION  Patient Name: Savannah Young Birthdate: Sep 06, 1931 Sex: female Admission Date (Current Location): 12/06/2016  [facility] [place] Number:  Savannah Young  ([id number]) Facility and Address:  [facility], [address], [place], [place] [address]      Provider Number: [id number]  Attending Physician Name and Address:  Gladstone Lighter, MD  Relative Name and Phone Number:       Current Level of Care: Hospital Recommended Level of Care: [place], Memory Care Prior Approval Number:    Date Approved/Denied:   PASRR Number:  [id number])  Discharge Plan: Domiciliary (Rest home) (Memory Care)    Current Diagnoses: Primary: Dementia  Patient Active Problem List   Diagnosis Date Noted  . Syncope 12/06/2016  . Iron deficiency anemia due to chronic blood loss 11/27/2016  . Encounter for blood transfusion 10/16/2016  . Abnormal findings on esophagogastroduodenoscopy (EGD) 10/16/2016  . Angiodysplasia of stomach and duodenum with bleeding 10/16/2016  . Thrombocytosis ([place]) 10/16/2016  . SVT (supraventricular tachycardia) ([place]) 10/16/2016  . Iron deficiency anemia 10/14/2016  . Thrombocythemia ([place]) 10/14/2016  . TIA (transient ischemic attack) 10/14/2016  . Encephalopathy acute 03/07/2016  . Pressure ulcer 02/23/2016  . Confusion 02/22/2016  . Agitation 01/20/2016    Orientation RESPIRATION BLADDER Height & Weight     Self  Normal Continent Weight: 100 lb (45.4 kg) Height:  5' (152.4 cm)  BEHAVIORAL SYMPTOMS/MOOD NEUROLOGICAL BOWEL NUTRITION STATUS   (none)  (none) Continent Diet (Diet: Low Sodium/ Heart Healthy )  AMBULATORY STATUS COMMUNICATION OF NEEDS Skin   Supervision Verbally Normal                       Personal Care Assistance Level of Assistance  Bathing, Feeding, Dressing Bathing Assistance: Limited assistance Feeding assistance:  Independent Dressing Assistance: Limited assistance     Functional Limitations Info  Sight, Hearing, Speech Sight Info: Adequate Hearing Info: Adequate Speech Info: Adequate    SPECIAL CARE FACTORS FREQUENCY        PT Frequency:  (no PT needs )              Contractures      Additional Factors Info  Code Status, Allergies Code Status Info:  (Full Code. ) Allergies Info:  (No Known Allergies. )          Discharge Medications: Please see discharge summary for a list of discharge medications. Medication List    TAKE these medications   acetaminophen 500 MG tablet Commonly known as:  TYLENOL Take 500 mg by mouth 3 (three) times daily as needed.   ALPRAZolam 0.25 MG tablet Commonly known as:  XANAX Take 1 tablet (0.25 mg total) by mouth 3 (three) times daily as needed for anxiety.   atorvastatin 20 MG tablet Commonly known as:  LIPITOR Take 1 tablet (20 mg total) by mouth daily.   cephALEXin 500 MG capsule Commonly known as:  KEFLEX Take 1 capsule (500 mg total) by mouth every 12 (twelve) hours. X 6 more days   Cranberry 425 MG Caps Take 425 mg by mouth daily.   docusate sodium 100 MG capsule Commonly known as:  COLACE Take 1 capsule (100 mg total) by mouth 2 (two) times daily as needed for mild constipation.   fexofenadine 180 MG tablet Commonly known as:  ALLEGRA Take 180 mg by mouth daily.   fluticasone 50 MCG/ACT nasal spray Commonly known  as:  FLONASE Place 2 sprays into both nostrils daily.   guaifenesin 100 MG/5ML syrup Commonly known as:  ROBITUSSIN Take 200 mg by mouth 3 (three) times daily as needed for cough.   lactose free nutrition Liqd Take 237 mLs by mouth 2 (two) times daily between meals.   Melatonin 10 MG Tabs Take 10 mg by mouth at bedtime.   metoprolol tartrate 25 MG tablet Commonly known as:  LOPRESSOR Take 1 tablet (25 mg total) by mouth 2 (two) times daily.   NAMENDA XR 28 MG Cp24 24 hr capsule Generic  drug:  memantine Take 28 mg by mouth at bedtime.   pantoprazole 40 MG tablet Commonly known as:  PROTONIX Take 1 tablet (40 mg total) by mouth daily.   QUEtiapine 25 MG tablet Commonly known as:  SEROQUEL Take 0.5 tablets (12.5 mg total) by mouth daily before supper.   Relevant Imaging Results: Relevant Lab Results: Additional Information  (SSN: [id number])  Savannah Young, Veronia Beets, LCSW

## 2016-12-07 NOTE — Progress Notes (Signed)
Unable to fully complete patient's admission assessment data due to cognitive limitations.

## 2016-12-07 NOTE — Progress Notes (Signed)
Patient discharged via wheelchair and private vehicle. IV removed and catheter intact. All discharge instructions given and patient verbalizes understanding. Tele removed and returned. No prescriptions given to patient No distress noted.   

## 2016-12-07 NOTE — Care Management (Addendum)
Consult present for CM to assess home health needs.  Patient is from [place] and currently receiving home health SN and PT with [facility].  She was placed in observation after having syncope episode in the cancer center while receiving iron infusion.  Patient with advanced dementia

## 2016-12-07 NOTE — Care Management Obs Status (Signed)
[place] NOTIFICATION   Patient Details  Name: ARMINDA FOGLIO MRN: [id number] Date of Birth: Jan 15, 1932   Medicare Observation Status Notification Given:  Yes  Patient with advanced dementia and not able to sign notice.  Placed notice in patient's room with CM contact information.  Reaching out to contacts to explain notice    Katrina Stack, RN 12/07/2016, 10:25 AM

## 2016-12-07 NOTE — Evaluation (Signed)
Physical Therapy Evaluation Patient Details Name: NEVIAH BRAUD MRN: [id number] DOB: 1932/07/15 Today's Date: 12/07/2016   History of Present Illness  pt is a 83 yo female admitted under observation due to an episode of unresponsiveness (syncope) at the cancer center during a ferriheme infusion. Pt is from [facility] and has a PMH of; Dementia, HTN, DM, Anemia, and COPD    Clinical Impression  Pt awake and slightly annoyed during session but able to follow basic commands. She has a history of dementia at baseline and was unable to provide any information concerning her history or PLOF, states she does not use a walker or a cane. Per chart review pt lives at [facility].  During evaluation she was able to perform all mobility tasks independently w/ PT supervision and ambulated safely around the nursing station w/o any AD. No LOB or staggering gait noted. Following session nursing stated the patient has been ambulating around the nursing station multiple times this morning w/o any LOB. Pt appears to be functioning at baseline levels and does not display any current PT needs. Will complete orders at this time, please resubmit orders if pt's status changes.    Follow Up Recommendations No PT follow up;Other (comment) (Return to ALF)    Equipment Recommendations  None recommended by PT    Recommendations for Other Services       Precautions / Restrictions Precautions Precautions: None Restrictions Weight Bearing Restrictions: No      Mobility  Bed Mobility               General bed mobility comments: not formally assessed pt was seated in recliner upon entrance  Transfers Overall transfer level: Independent Equipment used: None             General transfer comment: able to transfer from STS w/o AD and demonstrates good safe technique   Ambulation/Gait Ambulation/Gait assistance: Supervision Ambulation Distance (Feet): 200 Feet Assistive device: None Gait  Pattern/deviations: Step-through pattern   Gait velocity interpretation: at or above normal speed for age/gender General Gait Details: pt able to ambulate around nursing station safely w/ functional gait pattern, keeps arms crossed due to feeling cold, overall good balance and no staggering   Stairs            Wheelchair Mobility    Modified Rankin (Stroke Patients Only)       Balance Overall balance assessment: No apparent balance deficits (not formally assessed)                                           Pertinent Vitals/Pain Pain Assessment: No/denies pain    Home Living Family/patient expects to be discharged to:: Assisted living                 Additional Comments: pt reports she does not use a walker or cane    Prior Function Level of Independence: Needs assistance         Comments: unable to asses due to impaired cognition     Hand Dominance        Extremity/Trunk Assessment   Upper Extremity Assessment Upper Extremity Assessment: Overall WFL for tasks assessed    Lower Extremity Assessment Lower Extremity Assessment: Overall WFL for tasks assessed       Communication   Communication: No difficulties  Cognition Arousal/Alertness: Awake/alert Behavior During Therapy: Flat affect Overall  Cognitive Status: History of cognitive impairments - at baseline                                 General Comments: pt oriented to person but disoriented to time, place and situation, follows basic commands       General Comments      Exercises     Assessment/Plan    PT Assessment Patent does not need any further PT services  PT Problem List         PT Treatment Interventions      PT Goals (Current goals can be found in the Care Plan section)  Acute Rehab PT Goals PT Goal Formulation: Patient unable to participate in goal setting    Frequency     Barriers to discharge        Co-evaluation                End of Session Equipment Utilized During Treatment: Gait belt Activity Tolerance: Patient tolerated treatment well Patient left: in chair;with call bell/phone within reach;with chair alarm set Nurse Communication: Mobility status      Time: 1029-1040 PT Time Calculation (min) (ACUTE ONLY): 11 min   Charges:         PT G Codes:        [employer] Student PT 12/07/16, 10:57 AM [phone]   Tareka Jhaveri 12/07/2016, 10:53 AM

## 2016-12-07 NOTE — Discharge Summary (Signed)
[facility] [place] NAME: Savannah Young    MR#:  [id number]  DATE OF BIRTH:  03/21/1932  DATE OF ADMISSION:  12/06/2016   ADMITTING PHYSICIAN: Fritzi Mandes, MD  DATE OF DISCHARGE:  12/07/2016  PRIMARY CARE PHYSICIAN: Bonnita Nasuti, MD   ADMISSION DIAGNOSIS:   Lower urinary tract infectious disease [N39.0] Syncope, unspecified syncope type [R55]  DISCHARGE DIAGNOSIS:   Active Problems:   Syncope   SECONDARY DIAGNOSIS:   Past Medical History:  Diagnosis Date  . COPD (chronic obstructive pulmonary disease) ([place])   . Dementia   . Diabetes mellitus ([place])   . Hypertension     HOSPITAL COURSE:   83 year old female with past medical history significant for dementia, hypertension, COPD, diabetes from assisted living facility starting from [place] [place] secondary to syncopal episode during IV iron infusion.  #1 syncope-likely vasovagal. Monitored on telemetry. No arrhythmias noted. -Patient regained consciousness within seconds. She had a complete workup including MRI, Dopplers and echocardiogram done in 2017 which were normal and appropriate for her age. -She ambulated well with physical therapy. No other abnormalities and is being discharged to assisted living facility today.  #2 acute cystitis-received Rocephin. Will be discharged on Keflex.  #3 COPD-stable. Continue inhalers.  #4 dementia-alert but disoriented to place person and time. Continue Seroquel, Namenda. Seems to be at baseline.  #5 hypertension-continue metoprolol.  Patient is otherwise stable for discharge today  DISCHARGE CONDITIONS:   Guarded CONSULTS OBTAINED:    none  DRUG ALLERGIES:   No Known Allergies DISCHARGE MEDICATIONS:   Allergies as of 12/07/2016   No Known Allergies     Medication List    TAKE these medications   acetaminophen 500 MG tablet Commonly known as:  TYLENOL Take 500 mg by mouth 3 (three) times daily as needed.     ALPRAZolam 0.25 MG tablet Commonly known as:  XANAX Take 1 tablet (0.25 mg total) by mouth 3 (three) times daily as needed for anxiety.   atorvastatin 20 MG tablet Commonly known as:  LIPITOR Take 1 tablet (20 mg total) by mouth daily.   cephALEXin 500 MG capsule Commonly known as:  KEFLEX Take 1 capsule (500 mg total) by mouth every 12 (twelve) hours. X 6 more days   Cranberry 425 MG Caps Take 425 mg by mouth daily.   docusate sodium 100 MG capsule Commonly known as:  COLACE Take 1 capsule (100 mg total) by mouth 2 (two) times daily as needed for mild constipation.   fexofenadine 180 MG tablet Commonly known as:  ALLEGRA Take 180 mg by mouth daily.   fluticasone 50 MCG/ACT nasal spray Commonly known as:  FLONASE Place 2 sprays into both nostrils daily.   guaifenesin 100 MG/5ML syrup Commonly known as:  ROBITUSSIN Take 200 mg by mouth 3 (three) times daily as needed for cough.   lactose free nutrition Liqd Take 237 mLs by mouth 2 (two) times daily between meals.   Melatonin 10 MG Tabs Take 10 mg by mouth at bedtime.   metoprolol tartrate 25 MG tablet Commonly known as:  LOPRESSOR Take 1 tablet (25 mg total) by mouth 2 (two) times daily.   NAMENDA XR 28 MG Cp24 24 hr capsule Generic drug:  memantine Take 28 mg by mouth at bedtime.   pantoprazole 40 MG tablet Commonly known as:  PROTONIX Take 1 tablet (40 mg total) by mouth daily.   QUEtiapine 25 MG tablet Commonly known as:  SEROQUEL  Take 0.5 tablets (12.5 mg total) by mouth daily before supper.        DISCHARGE INSTRUCTIONS:   1. PCP f/u in 1 week  DIET:   Mechanical soft diet  ACTIVITY:   Activity as tolerated  OXYGEN:   Home Oxygen: No.  Oxygen Delivery: room air  DISCHARGE LOCATION:   Assisted Living   If you experience worsening of your admission symptoms, develop shortness of breath, life threatening emergency, suicidal or homicidal thoughts you must seek medical attention  immediately by calling 911 or calling your MD immediately  if symptoms less severe.  You Must read complete instructions/literature along with all the possible adverse reactions/side effects for all the Medicines you take and that have been prescribed to you. Take any new Medicines after you have completely understood and accpet all the possible adverse reactions/side effects.   Please note  You were cared for by a hospitalist during your hospital stay. If you have any questions about your discharge medications or the care you received while you were in the hospital after you are discharged, you can call the unit and asked to speak with the hospitalist on call if the hospitalist that took care of you is not available. Once you are discharged, your primary care physician will handle any further medical issues. Please note that NO REFILLS for any discharge medications will be authorized once you are discharged, as it is imperative that you return to your primary care physician (or establish a relationship with a primary care physician if you do not have one) for your aftercare needs so that they can reassess your need for medications and monitor your lab values.    On the day of Discharge:  VITAL SIGNS:   Blood pressure (!) 135/50, pulse (!) 54, temperature 98.3 F (36.8 C), resp. rate 17, height 5' (1.524 m), weight 45.4 kg (100 lb), last menstrual period 01/20/2016, SpO2 97 %.  PHYSICAL EXAMINATION:    GENERAL:  83 y.o.-year-old patient sitting in the chair with no acute distress.  EYES: Pupils equal, round, reactive to light and accommodation. No scleral icterus. Extraocular muscles intact.  HEENT: Head atraumatic, normocephalic. Oropharynx and nasopharynx clear.  NECK:  Supple, no jugular venous distention. No thyroid enlargement, no tenderness.  LUNGS: Normal breath sounds bilaterally, no wheezing, rales,rhonchi or crepitation. No use of accessory muscles of respiration.decreased bibasilar  breath sounds  CARDIOVASCULAR: S1, S2 normal. No  rubs, or gallops. 2/6 systolic murmur present ABDOMEN: Soft, non-tender, non-distended. Bowel sounds present. No organomegaly or mass.  EXTREMITIES: No pedal edema, cyanosis, or clubbing.  NEUROLOGIC: Cranial nerves II through XII are intact. No focal deficits noted on ambulation, does not follow commands at times. Sensation intact. Ambulated well PSYCHIATRIC: The patient is alert but disoriented  SKIN: No obvious rash, lesion, or ulcer.   DATA REVIEW:   CBC  Recent Labs Lab 12/06/16 1506  WBC 9.2  HGB 10.6*  HCT 34.1*  PLT 614*    Chemistries   Recent Labs Lab 12/06/16 1506  NA 143  K 3.9  CL 110  CO2 26  GLUCOSE 115*  BUN 13  CREATININE 0.82  CALCIUM 8.6*  AST 38  ALT 22  ALKPHOS 121  BILITOT 0.4     Microbiology Results  Results for orders placed or performed during the hospital encounter of 12/06/16  MRSA PCR Screening     Status: None   Collection Time: 12/07/16  4:25 AM  Result Value Ref Range Status   MRSA  by PCR NEGATIVE NEGATIVE Final    Comment:        The GeneXpert MRSA Assay (FDA approved for NASAL specimens only), is one component of a comprehensive MRSA colonization surveillance program. It is not intended to diagnose MRSA infection nor to guide or monitor treatment for MRSA infections.     RADIOLOGY:  No results found.   Management plans discussed with the patient, family and they are in agreement.  CODE STATUS:     Code Status Orders        Start     Ordered   12/06/16 2025  Full code  Continuous     12/06/16 2024    Code Status History    Date Active Date Inactive Code Status Order ID Comments User Context   10/14/2016  4:46 PM 10/16/2016  6:42 PM Full Code [id number]  Vaughan Basta, MD Inpatient   03/07/2016  2:20 PM 03/09/2016  5:35 PM Full Code [id number]  Lytle Butte, MD ED   02/22/2016  8:10 PM 02/24/2016  7:54 PM Full Code [id number]  Hillary Bow, MD ED    01/20/2016  7:47 PM 01/21/2016  4:40 PM Full Code [id number]  Gladstone Lighter, MD ED      TOTAL TIME TAKING CARE OF THIS PATIENT: 38 minutes.    Gladstone Lighter M.D on 12/07/2016 at 1:15 PM  Between 7am to 6pm - Pager - [phone]  After 6pm go to [email or web address] - Proofreader  [facility]  Office  [phone]  CC: Primary care physician; Bonnita Nasuti, MD   Note: This dictation was prepared with Dragon dictation along with smaller phrase technology. Any transcriptional errors that result from this process are unintentional.

## 2016-12-09 LAB — URINE CULTURE: Culture: >=100000 — AB

## 2017-01-25 ENCOUNTER — Emergency Department: Payer: Medicare Other

## 2017-01-25 ENCOUNTER — Encounter: Payer: Self-pay | Admitting: Emergency Medicine

## 2017-01-25 ENCOUNTER — Emergency Department
Admission: EM | Admit: 2017-01-25 | Discharge: 2017-01-25 | Disposition: A | Discharge status: Home / Self Care | Payer: Medicare Other | Attending: Emergency Medicine | Admitting: Emergency Medicine

## 2017-01-25 DIAGNOSIS — Z8673 Personal history of transient ischemic attack (TIA), and cerebral infarction without residual deficits: Secondary | ICD-10-CM | POA: Insufficient documentation

## 2017-01-25 DIAGNOSIS — J449 Chronic obstructive pulmonary disease, unspecified: Secondary | ICD-10-CM | POA: Diagnosis not present

## 2017-01-25 DIAGNOSIS — S0990XA Unspecified injury of head, initial encounter: Secondary | ICD-10-CM | POA: Diagnosis present

## 2017-01-25 DIAGNOSIS — R079 Chest pain, unspecified: Secondary | ICD-10-CM | POA: Diagnosis not present

## 2017-01-25 DIAGNOSIS — W010XXA Fall on same level from slipping, tripping and stumbling without subsequent striking against object, initial encounter: Secondary | ICD-10-CM | POA: Diagnosis not present

## 2017-01-25 DIAGNOSIS — Y999 Unspecified external cause status: Secondary | ICD-10-CM | POA: Diagnosis not present

## 2017-01-25 DIAGNOSIS — Z79899 Other long term (current) drug therapy: Secondary | ICD-10-CM | POA: Diagnosis not present

## 2017-01-25 DIAGNOSIS — Y939 Activity, unspecified: Secondary | ICD-10-CM | POA: Insufficient documentation

## 2017-01-25 DIAGNOSIS — F0390 Unspecified dementia without behavioral disturbance: Secondary | ICD-10-CM | POA: Diagnosis not present

## 2017-01-25 DIAGNOSIS — Y92129 Unspecified place in nursing home as the place of occurrence of the external cause: Secondary | ICD-10-CM | POA: Diagnosis not present

## 2017-01-25 DIAGNOSIS — I10 Essential (primary) hypertension: Secondary | ICD-10-CM | POA: Diagnosis not present

## 2017-01-25 DIAGNOSIS — E119 Type 2 diabetes mellitus without complications: Secondary | ICD-10-CM | POA: Insufficient documentation

## 2017-01-25 LAB — URINALYSIS, COMPLETE (UACMP) WITH MICROSCOPIC
BILIRUBIN URINE: NEGATIVE
Bacteria, UA: NONE SEEN
GLUCOSE, UA: NEGATIVE mg/dL
HGB URINE DIPSTICK: NEGATIVE
KETONES UR: NEGATIVE mg/dL
LEUKOCYTES UA: NEGATIVE
Nitrite: NEGATIVE
PROTEIN: NEGATIVE mg/dL
Specific Gravity, Urine: 1.011 (ref 1.005–1.030)
Squamous Epithelial / LPF: NONE SEEN
pH: 6 (ref 5.0–8.0)

## 2017-01-25 LAB — CBC
HCT: 35.3 % (ref 35.0–47.0)
Hemoglobin: 12.1 g/dL (ref 12.0–16.0)
MCH: 25.1 pg — ABNORMAL LOW (ref 26.0–34.0)
MCHC: 34.3 g/dL (ref 32.0–36.0)
MCV: 73.4 fL — ABNORMAL LOW (ref 80.0–100.0)
Platelets: 795 10*3/uL — ABNORMAL HIGH (ref 150–440)
RBC: 4.81 MIL/uL (ref 3.80–5.20)
RDW: 30.4 % — AB (ref 11.5–14.5)
WBC: 5.6 10*3/uL (ref 3.6–11.0)

## 2017-01-25 LAB — BASIC METABOLIC PANEL
ANION GAP: 7 (ref 5–15)
BUN: 13 mg/dL (ref 6–20)
CHLORIDE: 112 mmol/L — AB (ref 101–111)
CO2: 24 mmol/L (ref 22–32)
Calcium: 8.8 mg/dL — ABNORMAL LOW (ref 8.9–10.3)
Creatinine, Ser: 0.86 mg/dL (ref 0.44–1.00)
Glucose, Bld: 99 mg/dL (ref 65–99)
POTASSIUM: 3.9 mmol/L (ref 3.5–5.1)
SODIUM: 143 mmol/L (ref 135–145)

## 2017-01-25 LAB — TROPONIN I: Troponin I: <0.03 ng/mL (ref <0.03)

## 2017-01-25 NOTE — ED Notes (Signed)
Pt visualized in NAD at this time. Resting in bed under blankets. VSS and WNL. Denies any needs, will continue to monitor for further patient needs.

## 2017-01-25 NOTE — ED Triage Notes (Addendum)
Pt presents to ED via [facility] from [facility] on [address] due to unwitenessed fall. Per EMS, they were called to facility earlier today due to pt c/o chest pain. Pt denies chest pain at this time. Pt does not remember falling down. Pt has hx of dementia. Hematoma noted to left side of forehead.

## 2017-01-25 NOTE — ED Notes (Signed)
NAD noted at time of D/C. Pt taken back to [facility] by Clancy Gourd, Director of [address] on [address]. Denies any comments/concerns.

## 2017-01-25 NOTE — ED Notes (Signed)
This RN notified by Anderson Malta, RN, that patient had attempted to get out of bed while this RN was off the floor. Pt able to be redirected, now visualized resting in bed at this time.

## 2017-01-25 NOTE — ED Notes (Signed)
Patient transported to CT 

## 2017-01-25 NOTE — ED Notes (Signed)
This RN to bedside with Savannah Young. Introduced selves to patient and collected 2nd troponin, pt tolerated well. Pt denies any needs, lights dimmed for patient comfort, door left open for best visual access of patient. Will continue to monitor for further patient needs.

## 2017-01-25 NOTE — ED Notes (Signed)
Pt resting in bed at this time. Lights remain dimmed for patient comfort. Will continue to monitor for further patient needs.

## 2017-01-25 NOTE — ED Provider Notes (Signed)
[facility] Emergency Department Provider Note    First MD Initiated Contact with Patient 01/25/17 [phone]     (approximate)  I have reviewed the triage vital signs and the nursing notes.  L level 5 caveat: Alzheimer's dementia HISTORY  Chief Complaint Chest Pain and Fall   HPI Savannah Young is a 83 y.o. female below list of chronic medical conditions including dementia presents to the emergency department via EMS status post unwitnessed fall at the nursing facility where she resides. Per EMS patient informed the nursing staff earlier today of chest pain which spontaneously resolved. The EMS was notified at the patient had an unwitnessed fall before arrival.   Past Medical History:  Diagnosis Date  . COPD (chronic obstructive pulmonary disease) ([place])   . Dementia   . Diabetes mellitus ([place])   . Hypertension     Patient Active Problem List   Diagnosis Date Noted  . Syncope 12/06/2016  . Iron deficiency anemia due to chronic blood loss 11/27/2016  . Encounter for blood transfusion 10/16/2016  . Abnormal findings on esophagogastroduodenoscopy (EGD) 10/16/2016  . Angiodysplasia of stomach and duodenum with bleeding 10/16/2016  . Thrombocytosis ([place]) 10/16/2016  . SVT (supraventricular tachycardia) ([place]) 10/16/2016  . Iron deficiency anemia 10/14/2016  . Thrombocythemia ([place]) 10/14/2016  . TIA (transient ischemic attack) 10/14/2016  . Encephalopathy acute 03/07/2016  . Pressure ulcer 02/23/2016  . Confusion 02/22/2016  . Agitation 01/20/2016    Past Surgical History:  Procedure Laterality Date  . ESOPHAGOGASTRODUODENOSCOPY (EGD) WITH PROPOFOL N/A 10/16/2016   Procedure: ESOPHAGOGASTRODUODENOSCOPY (EGD) WITH PROPOFOL;  Surgeon: San Jetty, MD;  Location: [facility] ENDOSCOPY;  Service: Gastroenterology;  Laterality: N/A;    Prior to Admission medications   Medication Sig Start Date End Date Taking? Authorizing Provider  acetaminophen (TYLENOL) 500  MG tablet Take 500 mg by mouth 3 (three) times daily as needed.   Yes [provider]  ALPRAZolam (XANAX) 0.25 MG tablet Take 1 tablet (0.25 mg total) by mouth 3 (three) times daily as needed for anxiety. 10/16/16  Yes Theodoro Grist, MD  atorvastatin (LIPITOR) 20 MG tablet Take 1 tablet (20 mg total) by mouth daily. 02/24/16  Yes Theodoro Grist, MD  Cranberry 425 MG CAPS Take 425 mg by mouth daily.   Yes [provider]  docusate sodium (COLACE) 100 MG capsule Take 1 capsule (100 mg total) by mouth 2 (two) times daily as needed for mild constipation. 10/16/16  Yes Theodoro Grist, MD  fexofenadine (ALLEGRA) 180 MG tablet Take 180 mg by mouth daily.   Yes [provider]  fluticasone (FLONASE) 50 MCG/ACT nasal spray Place 2 sprays into both nostrils daily.   Yes [provider]  guaifenesin (ROBITUSSIN) 100 MG/5ML syrup Take 200 mg by mouth 3 (three) times daily as needed for cough.   Yes [provider]  Melatonin 10 MG TABS Take 10 mg by mouth at bedtime.   Yes [provider]  memantine (NAMENDA XR) 28 MG CP24 24 hr capsule Take 28 mg by mouth at bedtime.   Yes [provider]  pantoprazole (PROTONIX) 40 MG tablet Take 1 tablet (40 mg total) by mouth daily. 10/16/16  Yes Theodoro Grist, MD  QUEtiapine (SEROQUEL) 25 MG tablet Take 0.5 tablets (12.5 mg total) by mouth daily before supper. 10/16/16  Yes Theodoro Grist, MD  cephALEXin (KEFLEX) 500 MG capsule Take 1 capsule (500 mg total) by mouth every 12 (twelve) hours. X 6 more days Patient not taking: Reported  on 01/25/2017 12/07/16   Gladstone Lighter, MD  metoprolol tartrate (LOPRESSOR) 25 MG tablet Take 1 tablet (25 mg total) by mouth 2 (two) times daily. 10/16/16   Theodoro Grist, MD    Allergies No known drug allergies  Family History  Problem Relation Age of Onset  . Hypertension Other     Social History Social History  Substance Use Topics  . Smoking status: Never Smoker  .  Smokeless tobacco: Former Systems developer  . Alcohol use No    Review of Systems Constitutional: No fever/chills Eyes: No visual changes. ENT: No sore throat. Cardiovascular: Denies chest pain. Respiratory: Denies shortness of breath. Gastrointestinal: No abdominal pain.  No nausea, no vomiting.  No diarrhea.  No constipation. Genitourinary: Negative for dysuria. Musculoskeletal: Negative for neck pain.  Negative for back pain. Integumentary: Negative for rash. Positive for left forehead swelling/contusion Neurological: Negative for headaches, focal weakness or numbness.   ____________________________________________   PHYSICAL EXAM:  VITAL SIGNS: ED Triage Vitals  Enc Vitals Group     BP 01/25/17 0337 (!) 155/84     Pulse Rate 01/25/17 0337 60     Resp 01/25/17 0337 16     Temp 01/25/17 0337 98.3 F (36.8 C)     Temp Source 01/25/17 0337 Oral     SpO2 01/25/17 0337 96 %     Weight 01/25/17 0327 52.2 kg (115 lb)     Height 01/25/17 0327 1.6 m (5\' 3" )     Head Circumference --      Peak Flow --      Pain Score --      Pain Loc --      Pain Edu? --      Excl. in [place]? --     Constitutional: Alert and . Well appearing and in no acute distress. Eyes: Conjunctivae are normal. PERRL. EOMI Head: 5 x 5 cm area of contusion swelling noted to the left forehead Nose: No congestion/rhinnorhea. Mouth/Throat: Mucous membranes are moist.  Oropharynx non-erythematous. Neck: No stridor.  Cardiovascular: Normal rate, regular rhythm. Good peripheral circulation. Grossly normal heart sounds. Respiratory: Normal respiratory effort.  No retractions. Lungs CTAB. Gastrointestinal: Soft and nontender. No distention.  Musculoskeletal: No lower extremity tenderness nor edema. No gross deformities of extremities. Neurologic:  No gross focal neurologic deficits are appreciated.  Skin:  Skin is warm, dry and intact. No rash noted. Psychiatric: Mood and affect are normal. Speech and behavior are  normal.  ____________________________________________   LABS (all labs ordered are listed, but only abnormal results are displayed)  Labs Reviewed  BASIC METABOLIC PANEL - Abnormal; Notable for the following:       Result Value   Chloride 112 (*)    Calcium 8.8 (*)    All other components within normal limits  CBC - Abnormal; Notable for the following:    MCV 73.4 (*)    MCH 25.1 (*)    RDW 30.4 (*)    Platelets 795 (*)    All other components within normal limits  URINALYSIS, COMPLETE (UACMP) WITH MICROSCOPIC - Abnormal; Notable for the following:    Color, Urine YELLOW (*)    APPearance CLEAR (*)    All other components within normal limits  TROPONIN I  TROPONIN I   ____________________________________________  EKG  ED ECG REPORT I, [place] N BROWN, the attending physician, personally viewed and interpreted this ECG.   Date: 01/25/2017  EKG Time: 3:40 AM  Rate: 62  Rhythm: Normal sinus rhythm  Axis:  Normal  Intervals: Normal  ST&T Change: None  ____________________________________________  RADIOLOGY I, [place] N BROWN, personally viewed and evaluated these images (plain radiographs) as part of my medical decision making, as well as reviewing the written report by the radiologist.  Ct Head Wo Contrast  Result Date: 01/25/2017 CLINICAL DATA:  Status post unwitnessed fall. Concern for head or cervical spine injury. Left frontal knot. Initial encounter. EXAM: CT HEAD WITHOUT CONTRAST CT CERVICAL SPINE WITHOUT CONTRAST TECHNIQUE: Multidetector CT imaging of the head and cervical spine was performed following the standard protocol without intravenous contrast. Multiplanar CT image reconstructions of the cervical spine were also generated. COMPARISON:  CT of the head performed 10/14/2016, and MRI of the brain performed 02/23/2016 FINDINGS: CT HEAD FINDINGS Brain: No evidence of acute infarction, hemorrhage, hydrocephalus, extra-axial collection or mass lesion/mass effect.  Prominence of the ventricles and sulci reflects moderate cortical volume loss. Cerebellar atrophy is noted. Mild periventricular white matter change likely reflects small vessel ischemic microangiopathy. The brainstem and fourth ventricle are within normal limits. The basal ganglia are unremarkable in appearance. The cerebral hemispheres demonstrate grossly normal gray-white differentiation. No mass effect or midline shift is seen. Vascular: No hyperdense vessel or unexpected calcification. Skull: There is no evidence of fracture; visualized osseous structures are unremarkable in appearance. Sinuses/Orbits: The visualized portions of the orbits are within normal limits. The paranasal sinuses and mastoid air cells are well-aerated. Other: Mild soft tissue swelling is noted overlying the left frontal calvarium. CT CERVICAL SPINE FINDINGS Alignment: Normal. Skull base and vertebrae: No acute fracture. No primary bone lesion or focal pathologic process. Soft tissues and spinal canal: No prevertebral fluid or swelling. No visible canal hematoma. Disc levels: Mild intervertebral disc space narrowing is noted at C5-C6, with small anterior and posterior disc osteophyte complexes. Upper chest: Emphysema is noted at the lung apices, with associated scarring. The thyroid gland is unremarkable in appearance. Other: No additional soft tissue abnormalities are seen. IMPRESSION: 1. No evidence of traumatic intracranial injury or fracture. 2. No evidence of fracture or subluxation along the cervical spine. 3. Mild soft tissue swelling overlying the left frontal calvarium. 4. Moderate cortical volume loss and scattered small vessel ischemic microangiopathy. 5. Minimal degenerative change at the lower cervical spine. 6. Emphysema at the lung apices, with associated scarring. Electronically Signed   By: Garald Balding M.D.   On: 01/25/2017 04:16   Ct Cervical Spine Wo Contrast  Result Date: 01/25/2017 CLINICAL DATA:  Status post  unwitnessed fall. Concern for head or cervical spine injury. Left frontal knot. Initial encounter. EXAM: CT HEAD WITHOUT CONTRAST CT CERVICAL SPINE WITHOUT CONTRAST TECHNIQUE: Multidetector CT imaging of the head and cervical spine was performed following the standard protocol without intravenous contrast. Multiplanar CT image reconstructions of the cervical spine were also generated. COMPARISON:  CT of the head performed 10/14/2016, and MRI of the brain performed 02/23/2016 FINDINGS: CT HEAD FINDINGS Brain: No evidence of acute infarction, hemorrhage, hydrocephalus, extra-axial collection or mass lesion/mass effect. Prominence of the ventricles and sulci reflects moderate cortical volume loss. Cerebellar atrophy is noted. Mild periventricular white matter change likely reflects small vessel ischemic microangiopathy. The brainstem and fourth ventricle are within normal limits. The basal ganglia are unremarkable in appearance. The cerebral hemispheres demonstrate grossly normal gray-white differentiation. No mass effect or midline shift is seen. Vascular: No hyperdense vessel or unexpected calcification. Skull: There is no evidence of fracture; visualized osseous structures are unremarkable in appearance. Sinuses/Orbits: The visualized portions of the orbits are  within normal limits. The paranasal sinuses and mastoid air cells are well-aerated. Other: Mild soft tissue swelling is noted overlying the left frontal calvarium. CT CERVICAL SPINE FINDINGS Alignment: Normal. Skull base and vertebrae: No acute fracture. No primary bone lesion or focal pathologic process. Soft tissues and spinal canal: No prevertebral fluid or swelling. No visible canal hematoma. Disc levels: Mild intervertebral disc space narrowing is noted at C5-C6, with small anterior and posterior disc osteophyte complexes. Upper chest: Emphysema is noted at the lung apices, with associated scarring. The thyroid gland is unremarkable in appearance. Other:  No additional soft tissue abnormalities are seen. IMPRESSION: 1. No evidence of traumatic intracranial injury or fracture. 2. No evidence of fracture or subluxation along the cervical spine. 3. Mild soft tissue swelling overlying the left frontal calvarium. 4. Moderate cortical volume loss and scattered small vessel ischemic microangiopathy. 5. Minimal degenerative change at the lower cervical spine. 6. Emphysema at the lung apices, with associated scarring. Electronically Signed   By: Garald Balding M.D.   On: 01/25/2017 04:16   Dg Chest Portable 1 View  Result Date: 01/25/2017 CLINICAL DATA:  Unwitnessed fall. History of dementia, COPD and hypertension. EXAM: PORTABLE CHEST 1 VIEW COMPARISON:  Chest radiograph October 14, 2016 FINDINGS: The cardiac silhouette is mildly enlarged and unchanged. Mildly calcified aortic knob. Mild chronic interstitial changes without pleural effusion or focal consolidation. No pneumothorax. Osteopenia. Soft tissue planes are nonsuspicious. IMPRESSION: Stable examination: Cardiomegaly and mild chronic interstitial changes. Electronically Signed   By: Elon Alas M.D.   On: 01/25/2017 03:47      Procedures   ____________________________________________   INITIAL IMPRESSION / ASSESSMENT AND PLAN / ED COURSE  Pertinent labs & imaging results that were available during my care of the patient were reviewed by me and considered in my medical decision making (see chart for details).  83 year old female presenting with unwitnessed fall. Laboratory data EKG and chest x-ray performed secondary to complaint of chest pain earlier yesterday. CT scan of the head revealed no acute abnormality. We'll obtain second troponin and if negative anticipate discharge home      ____________________________________________  FINAL CLINICAL IMPRESSION(S) / ED DIAGNOSES  Final diagnoses:  Injury of head, initial encounter     MEDICATIONS GIVEN DURING THIS  VISIT:  Medications - No data to display   NEW OUTPATIENT MEDICATIONS STARTED DURING THIS VISIT:  New Prescriptions   No medications on file    Modified Medications   No medications on file    Discontinued Medications   LACTOSE FREE NUTRITION (BOOST) LIQD    Take 237 mLs by mouth 2 (two) times daily between meals.     Note:  This document was prepared using Dragon voice recognition software and may include unintentional dictation errors.    Gregor Hams, MD 01/25/17 [phone]

## 2017-02-21 ENCOUNTER — Ambulatory Visit: Payer: Medicare Other

## 2017-02-21 ENCOUNTER — Other Ambulatory Visit: Payer: Medicare Other

## 2017-02-21 ENCOUNTER — Ambulatory Visit: Payer: Medicare Other | Admitting: Oncology

## 2017-03-12 NOTE — Progress Notes (Signed)
[place]  Telephone:([phone]) [phone] Fax:(336) [phone]  ID: Neil Crouch OB: 18-Jul-1932  MR#: [id number]  MPN#:[id number]  Patient Care Team: Raelyn Number, MD as PCP - General (Internal Medicine)  CHIEF COMPLAINT: Iron deficiency anemia, thrombocytosis.  INTERVAL HISTORY: Patient was seen in clinic on November 21, 2016. She returns to clinic today for repeat laboratory work and further evaluation. She has baseline dementia and much of the history is given by her family members. She currently feels well and is asymptomatic. She has no neurologic complaint. She denies any recent fevers or illnesses. She is good appetite and denies weight loss. She has no chest pain or shortness of breath. She denies any weakness or fatigue. She has no nausea, vomiting, constipation, or diarrhea. She has no melena or hematochezia. Patient offers no specific complaints today.  REVIEW OF SYSTEMS:   Review of Systems  Constitutional: Negative.  Negative for chills, fever, malaise/fatigue and weight loss.  Respiratory: Negative.  Negative for cough and shortness of breath.   Cardiovascular: Negative for chest pain and leg swelling.  Gastrointestinal: Negative.  Negative for abdominal pain, blood in stool and melena.  Skin: Negative.  Negative for rash.  Neurological: Negative.  Negative for weakness.  Endo/Heme/Allergies: Does not bruise/bleed easily.  Psychiatric/Behavioral: Positive for memory loss. The patient is not nervous/anxious.     As per HPI. Otherwise, a complete review of systems is negative.  PAST MEDICAL HISTORY: Past Medical History:  Diagnosis Date  . COPD (chronic obstructive pulmonary disease) ([place])   . Dementia   . Diabetes mellitus ([place])   . Hypertension     PAST SURGICAL HISTORY: Past Surgical History:  Procedure Laterality Date  . ESOPHAGOGASTRODUODENOSCOPY (EGD) WITH PROPOFOL N/A 10/16/2016   Procedure: ESOPHAGOGASTRODUODENOSCOPY (EGD) WITH PROPOFOL;   Surgeon: San Jetty, MD;  Location: [facility] ENDOSCOPY;  Service: Gastroenterology;  Laterality: N/A;    FAMILY HISTORY: Family History  Problem Relation Age of Onset  . Hypertension Other     ADVANCED DIRECTIVES (Y/N):  N  HEALTH MAINTENANCE: Social History  Substance Use Topics  . Smoking status: Never Smoker  . Smokeless tobacco: Former Systems developer  . Alcohol use No     Colonoscopy:  PAP:  Bone density:  Lipid panel:  No Known Allergies  Current Outpatient Prescriptions  Medication Sig Dispense Refill  . acetaminophen (TYLENOL) 500 MG tablet Take 500 mg by mouth 3 (three) times daily as needed.    Marland Kitchen atorvastatin (LIPITOR) 20 MG tablet Take 1 tablet (20 mg total) by mouth daily. 30 tablet 0  . Cranberry 425 MG CAPS Take 425 mg by mouth daily.    Marland Kitchen docusate sodium (COLACE) 100 MG capsule Take 1 capsule (100 mg total) by mouth 2 (two) times daily as needed for mild constipation. 10 capsule 0  . fexofenadine (ALLEGRA) 180 MG tablet Take 180 mg by mouth daily.    . fluticasone (FLONASE) 50 MCG/ACT nasal spray Place 2 sprays into both nostrils daily.    Marland Kitchen guaifenesin (ROBITUSSIN) 100 MG/5ML syrup Take 200 mg by mouth 3 (three) times daily as needed for cough.    . Melatonin 10 MG TABS Take 10 mg by mouth at bedtime.    . memantine (NAMENDA XR) 28 MG CP24 24 hr capsule Take 28 mg by mouth at bedtime.    . metoprolol tartrate (LOPRESSOR) 25 MG tablet Take 1 tablet (25 mg total) by mouth 2 (two) times daily. 60 tablet 6  . pantoprazole (PROTONIX) 40 MG tablet Take  1 tablet (40 mg total) by mouth daily. 30 tablet 6  . QUEtiapine (SEROQUEL) 25 MG tablet Take 0.5 tablets (12.5 mg total) by mouth daily before supper. 15 tablet 6  . ALPRAZolam (XANAX) 0.25 MG tablet Take 1 tablet (0.25 mg total) by mouth 3 (three) times daily as needed for anxiety. (Patient not taking: Reported on 03/14/2017) 30 tablet 0  . cephALEXin (KEFLEX) 500 MG capsule Take 1 capsule (500 mg total) by mouth every 12  (twelve) hours. X 6 more days (Patient not taking: Reported on 01/25/2017) 12 capsule 0   No current facility-administered medications for this visit.     OBJECTIVE: Vitals:   03/14/17 1421  BP: 120/82  Pulse: 73  Resp: 18  Temp: (!) 96 F (35.6 C)     Body mass index is 18.74 kg/m.    ECOG FS:1 - Symptomatic but completely ambulatory  General: Well-developed, well-nourished, no acute distress. Eyes: Pink conjunctiva, anicteric sclera. Lungs: Clear to auscultation bilaterally. Heart: Regular rate and rhythm. No rubs, murmurs, or gallops. Abdomen: Soft, nontender, nondistended. No organomegaly noted, normoactive bowel sounds. Musculoskeletal: No edema, cyanosis, or clubbing. Neuro: Alert, answering all questions appropriately. Cranial nerves grossly intact. Skin: No rashes or petechiae noted. Psych: Normal affect.  LAB RESULTS:  Lab Results  Component Value Date   NA 143 01/25/2017   K 3.9 01/25/2017   CL 112 (H) 01/25/2017   CO2 24 01/25/2017   GLUCOSE 99 01/25/2017   BUN 13 01/25/2017   CREATININE 0.86 01/25/2017   CALCIUM 8.8 (L) 01/25/2017   PROT 6.5 12/06/2016   ALBUMIN 3.4 (L) 12/06/2016   AST 38 12/06/2016   ALT 22 12/06/2016   ALKPHOS 121 12/06/2016   BILITOT 0.4 12/06/2016   GFRNONAA >60 01/25/2017   GFRAA >60 01/25/2017    Lab Results  Component Value Date   WBC 6.6 03/14/2017   NEUTROABS 4.4 03/14/2017   HGB 13.2 03/14/2017   HCT 39.1 03/14/2017   MCV 77.6 (L) 03/14/2017   PLT 1,027 (HH) 03/14/2017   Lab Results  Component Value Date   IRON 85 03/14/2017   TIBC 321 03/14/2017   IRONPCTSAT 27 03/14/2017   Lab Results  Component Value Date   FERRITIN 49 03/14/2017     STUDIES: No results found.  ASSESSMENT: Iron deficiency anemia, thrombocytosis  PLAN:    1. Iron deficiency anemia: Patient's hemoglobin Sores continue to be within normal limits. She last received IV Feraheme on December 06, 2016. No further intervention is needed.  2.  Thrombocytosis: Initially thought to be secondary to iron deficiency anemia, but now that her iron stores and hemoglobin are within normal limits patient may have a component of essential thrombocytosis. JAK-2 mutation as well as peripheral blood flow cytometry have been ordered for completeness and are pending at time of dictation. Patient will return to clinic in 3 weeks to discuss the results. She likely will need treatment with Hydrea even if results are negative.   Approximately 30 minutes was spent in discussion of which greater than 50% was consultation.  Patient expressed understanding and was in agreement with this plan. She also understands that She can call clinic at any time with any questions, concerns, or complaints.    Lloyd Huger, MD   03/19/2017 11:34 AM

## 2017-03-14 ENCOUNTER — Inpatient Hospital Stay: Payer: Medicare Other | Attending: Oncology

## 2017-03-14 ENCOUNTER — Inpatient Hospital Stay: Payer: Medicare Other

## 2017-03-14 ENCOUNTER — Inpatient Hospital Stay (HOSPITAL_BASED_OUTPATIENT_CLINIC_OR_DEPARTMENT_OTHER): Payer: Medicare Other | Admitting: Oncology

## 2017-03-14 VITALS — BP 120/82 | HR 73 | Temp 96.0°F | Resp 18 | Wt 105.8 lb

## 2017-03-14 DIAGNOSIS — D500 Iron deficiency anemia secondary to blood loss (chronic): Secondary | ICD-10-CM

## 2017-03-14 DIAGNOSIS — F0390 Unspecified dementia without behavioral disturbance: Secondary | ICD-10-CM | POA: Diagnosis not present

## 2017-03-14 DIAGNOSIS — D75839 Thrombocytosis, unspecified: Secondary | ICD-10-CM

## 2017-03-14 DIAGNOSIS — D473 Essential (hemorrhagic) thrombocythemia: Secondary | ICD-10-CM

## 2017-03-14 DIAGNOSIS — E119 Type 2 diabetes mellitus without complications: Secondary | ICD-10-CM | POA: Insufficient documentation

## 2017-03-14 DIAGNOSIS — I10 Essential (primary) hypertension: Secondary | ICD-10-CM | POA: Diagnosis not present

## 2017-03-14 DIAGNOSIS — Z79899 Other long term (current) drug therapy: Secondary | ICD-10-CM

## 2017-03-14 DIAGNOSIS — D509 Iron deficiency anemia, unspecified: Secondary | ICD-10-CM | POA: Diagnosis not present

## 2017-03-14 DIAGNOSIS — J449 Chronic obstructive pulmonary disease, unspecified: Secondary | ICD-10-CM | POA: Insufficient documentation

## 2017-03-14 LAB — CBC WITH DIFFERENTIAL/PLATELET
Basophils Absolute: 0.3 10*3/uL — ABNORMAL HIGH (ref 0–0.1)
Basophils Relative: 4 %
Eosinophils Absolute: 0.1 10*3/uL (ref 0–0.7)
Eosinophils Relative: 1 %
HCT: 39.1 % (ref 35.0–47.0)
Hemoglobin: 13.2 g/dL (ref 12.0–16.0)
LYMPHS PCT: 20 %
Lymphs Abs: 1.3 10*3/uL (ref 1.0–3.6)
MCH: 26.1 pg (ref 26.0–34.0)
MCHC: 33.6 g/dL (ref 32.0–36.0)
MCV: 77.6 fL — ABNORMAL LOW (ref 80.0–100.0)
Monocytes Absolute: 0.5 10*3/uL (ref 0.2–0.9)
Monocytes Relative: 8 %
NEUTROS ABS: 4.4 10*3/uL (ref 1.4–6.5)
Neutrophils Relative %: 67 %
PLATELETS: 1027 10*3/uL — AB (ref 150–400)
RBC: 5.04 MIL/uL (ref 3.80–5.20)
RDW: 19.8 % — AB (ref 11.5–14.5)
WBC: 6.6 10*3/uL (ref 3.6–11.0)

## 2017-03-14 LAB — IRON AND TIBC
Iron: 85 ug/dL (ref 28–170)
Saturation Ratios: 27 % (ref 10.4–31.8)
TIBC: 321 ug/dL (ref 250–450)
UIBC: 236 ug/dL

## 2017-03-14 LAB — FERRITIN: Ferritin: 49 ng/mL (ref 11–307)

## 2017-03-14 NOTE — Progress Notes (Signed)
Patient denies any concerns today.  

## 2017-03-28 LAB — COMP PANEL: LEUKEMIA/LYMPHOMA

## 2017-03-29 LAB — JAK2 GENOTYPR

## 2017-04-03 NOTE — Progress Notes (Signed)
[place]  Telephone:([phone] [phone]  ID: Savannah Young OB: Sep 12, 1931  MR#: [id number]  PYP#:[id number]  Patient Care Team: Raelyn Number, MD as PCP - General (Internal Medicine)  CHIEF COMPLAINT: JAK-2 positive essential thrombocytosis.   INTERVAL HISTORY: Patient returns to clinic today for repeat laboratory work, further evaluation, and initiation of Hydrea. She has baseline dementia and much of the history is given by her caretaker. She currently feels well and is asymptomatic. She has no neurologic complaints. She denies any recent fevers or illnesses. She has a good appetite and denies weight loss. She has no chest pain or shortness of breath. She denies any weakness or fatigue. She has no nausea, vomiting, constipation, or diarrhea. She has no melena or hematochezia. Patient offers no specific complaints today.  REVIEW OF SYSTEMS:   Review of Systems  Constitutional: Negative.  Negative for chills, fever, malaise/fatigue and weight loss.  Respiratory: Negative.  Negative for cough and shortness of breath.   Cardiovascular: Negative for chest pain and leg swelling.  Gastrointestinal: Negative.  Negative for abdominal pain, blood in stool and melena.  Skin: Negative.  Negative for rash.  Neurological: Negative.  Negative for weakness.  Endo/Heme/Allergies: Does not bruise/bleed easily.  Psychiatric/Behavioral: Positive for memory loss. The patient is not nervous/anxious.     As per HPI. Otherwise, a complete review of systems is negative.  PAST MEDICAL HISTORY: Past Medical History:  Diagnosis Date  . COPD (chronic obstructive pulmonary disease) ([place])   . Dementia   . Diabetes mellitus ([place])   . Hypertension     PAST SURGICAL HISTORY: Past Surgical History:  Procedure Laterality Date  . ESOPHAGOGASTRODUODENOSCOPY (EGD) WITH PROPOFOL N/A 10/16/2016   Procedure: ESOPHAGOGASTRODUODENOSCOPY (EGD) WITH PROPOFOL;  Surgeon: San Jetty,  MD;  Location: [facility] ENDOSCOPY;  Service: Gastroenterology;  Laterality: N/A;    FAMILY HISTORY: Family History  Problem Relation Age of Onset  . Hypertension Other     ADVANCED DIRECTIVES (Y/N):  N  HEALTH MAINTENANCE: Social History  Substance Use Topics  . Smoking status: Never Smoker  . Smokeless tobacco: Former Systems developer  . Alcohol use No     Colonoscopy:  PAP:  Bone density:  Lipid panel:  No Known Allergies  Current Outpatient Prescriptions  Medication Sig Dispense Refill  . acetaminophen (TYLENOL) 500 MG tablet Take 500 mg by mouth 3 (three) times daily as needed.    Marland Kitchen atorvastatin (LIPITOR) 20 MG tablet Take 1 tablet (20 mg total) by mouth daily. 30 tablet 0  . Cholecalciferol 50000 units TABS Take by mouth.    . Cranberry 425 MG CAPS Take 425 mg by mouth daily.    Marland Kitchen docusate sodium (COLACE) 100 MG capsule Take 1 capsule (100 mg total) by mouth 2 (two) times daily as needed for mild constipation. 10 capsule 0  . ferrous gluconate (FERGON) 324 MG tablet Take 240 mg by mouth daily with breakfast.    . fexofenadine (ALLEGRA) 180 MG tablet Take 180 mg by mouth daily.    . fluticasone (FLONASE) 50 MCG/ACT nasal spray Place 2 sprays into both nostrils daily.    . hydroxyurea (HYDREA) 500 MG capsule Take 1 capsule (500 mg total) by mouth daily. May take with food to minimize GI side effects. 30 capsule 3  . LORazepam (ATIVAN) 0.5 MG tablet Take 0.5 mg by mouth every 4 (four) hours as needed for anxiety.    . Melatonin 10 MG TABS Take 10 mg by mouth at bedtime.    Marland Kitchen  memantine (NAMENDA XR) 28 MG CP24 24 hr capsule Take 28 mg by mouth at bedtime.    . metoprolol tartrate (LOPRESSOR) 25 MG tablet Take 1 tablet (25 mg total) by mouth 2 (two) times daily. 60 tablet 6  . mirtazapine (REMERON) 15 MG tablet Take 15 mg by mouth at bedtime.    . pantoprazole (PROTONIX) 40 MG tablet Take 1 tablet (40 mg total) by mouth daily. 30 tablet 6  . QUEtiapine (SEROQUEL) 25 MG tablet Take 0.5  tablets (12.5 mg total) by mouth daily before supper. 15 tablet 6   No current facility-administered medications for this visit.     OBJECTIVE: Vitals:   04/04/17 1427  BP: 134/69  Pulse: 67  Temp: (!) 97.2 F (36.2 C)     Body mass index is 18.97 kg/m.    ECOG FS:1 - Symptomatic but completely ambulatory  General: Well-developed, well-nourished, no acute distress. Eyes: Pink conjunctiva, anicteric sclera. Lungs: Clear to auscultation bilaterally. Heart: Regular rate and rhythm. No rubs, murmurs, or gallops. Abdomen: Soft, nontender, nondistended. No organomegaly noted, normoactive bowel sounds. Musculoskeletal: No edema, cyanosis, or clubbing. Neuro: Alert, answering all questions appropriately. Cranial nerves grossly intact. Skin: No rashes or petechiae noted. Psych: Normal affect.  LAB RESULTS:  Lab Results  Component Value Date   NA 143 01/25/2017   K 3.9 01/25/2017   CL 112 (H) 01/25/2017   CO2 24 01/25/2017   GLUCOSE 99 01/25/2017   BUN 13 01/25/2017   CREATININE 0.86 01/25/2017   CALCIUM 8.8 (L) 01/25/2017   PROT 6.5 12/06/2016   ALBUMIN 3.4 (L) 12/06/2016   AST 38 12/06/2016   ALT 22 12/06/2016   ALKPHOS 121 12/06/2016   BILITOT 0.4 12/06/2016   GFRNONAA >60 01/25/2017   GFRAA >60 01/25/2017    Lab Results  Component Value Date   WBC 6.6 03/14/2017   NEUTROABS 4.4 03/14/2017   HGB 13.2 03/14/2017   HCT 39.1 03/14/2017   MCV 77.6 (L) 03/14/2017   PLT 1,027 (HH) 03/14/2017   Lab Results  Component Value Date   IRON 85 03/14/2017   TIBC 321 03/14/2017   IRONPCTSAT 27 03/14/2017   Lab Results  Component Value Date   FERRITIN 49 03/14/2017     STUDIES: No results found.  ASSESSMENT: JAK-2 positive essential thrombocytosis.   PLAN:    1. JAK-2 positive essential thrombocytosis: Patient's platelet count remains significantly elevated. The remainder of her blood work, including peripheral blood flow cytometry, is either negative or within  normal limits. Patient was given a prescription for 500 mg Hydrea daily today. Return to clinic in 4 weeks for repeat laboratory work and further evaluation. 2. Iron deficiency anemia: Patient's hemoglobin and iron stores continue to be within normal limits. She last received IV Feraheme on December 06, 2016. No further intervention is needed.   Approximately 30 minutes was spent in discussion of which greater than 50% was consultation.  Patient expressed understanding and was in agreement with this plan. She also understands that She can call clinic at any time with any questions, concerns, or complaints.    Lloyd Huger, MD   04/07/2017 3:43 PM

## 2017-04-04 ENCOUNTER — Encounter: Payer: Self-pay | Admitting: Nurse Practitioner

## 2017-04-04 ENCOUNTER — Inpatient Hospital Stay (HOSPITAL_BASED_OUTPATIENT_CLINIC_OR_DEPARTMENT_OTHER): Payer: Medicare Other | Admitting: Oncology

## 2017-04-04 ENCOUNTER — Inpatient Hospital Stay: Payer: Medicare Other | Attending: Oncology

## 2017-04-04 ENCOUNTER — Encounter: Payer: Self-pay | Admitting: Oncology

## 2017-04-04 VITALS — BP 134/69 | HR 67 | Temp 97.2°F | Wt 107.1 lb

## 2017-04-04 DIAGNOSIS — D509 Iron deficiency anemia, unspecified: Secondary | ICD-10-CM

## 2017-04-04 DIAGNOSIS — J449 Chronic obstructive pulmonary disease, unspecified: Secondary | ICD-10-CM | POA: Insufficient documentation

## 2017-04-04 DIAGNOSIS — Z79899 Other long term (current) drug therapy: Secondary | ICD-10-CM

## 2017-04-04 DIAGNOSIS — F0390 Unspecified dementia without behavioral disturbance: Secondary | ICD-10-CM | POA: Diagnosis not present

## 2017-04-04 DIAGNOSIS — D473 Essential (hemorrhagic) thrombocythemia: Secondary | ICD-10-CM

## 2017-04-04 DIAGNOSIS — E119 Type 2 diabetes mellitus without complications: Secondary | ICD-10-CM | POA: Diagnosis not present

## 2017-04-04 DIAGNOSIS — I10 Essential (primary) hypertension: Secondary | ICD-10-CM | POA: Insufficient documentation

## 2017-04-04 MED ORDER — HYDROXYUREA 500 MG PO CAPS: 500.0000 mg | ORAL_CAPSULE | Freq: Every day | ORAL | 3 refills | Status: DC

## 2017-05-03 NOTE — Progress Notes (Signed)
[place]  Telephone:([phone] [phone]  ID: Neil Crouch OB: 1932/03/16  MR#: [id number]  UYQ#:[id number]  Patient Care Team: Raelyn Number, MD as PCP - General (Internal Medicine)  CHIEF COMPLAINT: JAK-2 positive essential thrombocytosis.   INTERVAL HISTORY: Patient returns to clinic today for repeat laboratory work and further evaluation. She has baseline dementia and much of the history is given by her caretaker. She currently feels well and is asymptomatic. She is tolerating Hydrea.  She has no neurologic complaints. She denies any recent fevers or illnesses. She has a good appetite and denies weight loss. She has no chest pain or shortness of breath. She denies any weakness or fatigue. She has no nausea, vomiting, constipation, or diarrhea. She has no melena or hematochezia. Patient offers no specific complaints today.  REVIEW OF SYSTEMS:   Review of Systems  Constitutional: Negative.  Negative for chills, fever, malaise/fatigue and weight loss.  Respiratory: Negative.  Negative for cough and shortness of breath.   Cardiovascular: Negative for chest pain and leg swelling.  Gastrointestinal: Negative.  Negative for abdominal pain, blood in stool and melena.  Skin: Negative.  Negative for rash.  Neurological: Negative.  Negative for weakness.  Endo/Heme/Allergies: Does not bruise/bleed easily.  Psychiatric/Behavioral: Positive for memory loss. The patient is not nervous/anxious.     As per HPI. Otherwise, a complete review of systems is negative.  PAST MEDICAL HISTORY: Past Medical History:  Diagnosis Date  . COPD (chronic obstructive pulmonary disease) ([place])   . Dementia   . Diabetes mellitus ([place])   . Hypertension     PAST SURGICAL HISTORY: Past Surgical History:  Procedure Laterality Date  . ESOPHAGOGASTRODUODENOSCOPY (EGD) WITH PROPOFOL N/A 10/16/2016   Procedure: ESOPHAGOGASTRODUODENOSCOPY (EGD) WITH PROPOFOL;  Surgeon: San Jetty, MD;  Location: [facility] ENDOSCOPY;  Service: Gastroenterology;  Laterality: N/A;    FAMILY HISTORY: Family History  Problem Relation Age of Onset  . Hypertension Other     ADVANCED DIRECTIVES (Y/N):  N  HEALTH MAINTENANCE: Social History  Substance Use Topics  . Smoking status: Never Smoker  . Smokeless tobacco: Former Systems developer  . Alcohol use No     Colonoscopy:  PAP:  Bone density:  Lipid panel:  No Known Allergies  Current Outpatient Prescriptions  Medication Sig Dispense Refill  . atorvastatin (LIPITOR) 20 MG tablet Take 1 tablet (20 mg total) by mouth daily. 30 tablet 0  . docusate sodium (COLACE) 100 MG capsule Take 1 capsule (100 mg total) by mouth 2 (two) times daily as needed for mild constipation. 10 capsule 0  . ferrous gluconate (FERGON) 324 MG tablet Take 240 mg by mouth daily with breakfast.    . fexofenadine (ALLEGRA) 180 MG tablet Take 180 mg by mouth daily.    . fluticasone (FLONASE) 50 MCG/ACT nasal spray Place 2 sprays into both nostrils daily.    . hydroxyurea (HYDREA) 500 MG capsule Take 1 capsule (500 mg total) by mouth daily. May take with food to minimize GI side effects. 30 capsule 3  . LORazepam (ATIVAN) 0.5 MG tablet Take 0.5 mg by mouth every 4 (four) hours as needed for anxiety.    . Melatonin 10 MG TABS Take 10 mg by mouth at bedtime.    . memantine (NAMENDA XR) 28 MG CP24 24 hr capsule Take 28 mg by mouth at bedtime.    . metoprolol tartrate (LOPRESSOR) 25 MG tablet Take 1 tablet (25 mg total) by mouth 2 (two) times daily. 60 tablet  6  . mirtazapine (REMERON) 15 MG tablet Take 15 mg by mouth at bedtime.    . pantoprazole (PROTONIX) 40 MG tablet Take 1 tablet (40 mg total) by mouth daily. 30 tablet 6  . QUEtiapine (SEROQUEL) 25 MG tablet Take 0.5 tablets (12.5 mg total) by mouth daily before supper. 15 tablet 6  . acetaminophen (TYLENOL) 500 MG tablet Take 500 mg by mouth 3 (three) times daily as needed.    . Cholecalciferol 50000 units TABS Take  by mouth.    . Cranberry 425 MG CAPS Take 425 mg by mouth daily.     No current facility-administered medications for this visit.     OBJECTIVE: Vitals:   05/04/17 1518  BP: 128/72  Pulse: 72  Resp: 20  Temp: (!) 96.1 F (35.6 C)     Body mass index is 19.01 kg/m.    ECOG FS:1 - Symptomatic but completely ambulatory  General: Well-developed, well-nourished, no acute distress. Eyes: Pink conjunctiva, anicteric sclera. Lungs: Clear to auscultation bilaterally. Heart: Regular rate and rhythm. No rubs, murmurs, or gallops. Abdomen: Soft, nontender, nondistended. No organomegaly noted, normoactive bowel sounds. Musculoskeletal: No edema, cyanosis, or clubbing. Neuro: Alert, but confused. Cranial nerves grossly intact. Skin: No rashes or petechiae noted. Psych: Normal affect.  LAB RESULTS:  Lab Results  Component Value Date   NA 143 01/25/2017   K 3.9 01/25/2017   CL 112 (H) 01/25/2017   CO2 24 01/25/2017   GLUCOSE 99 01/25/2017   BUN 13 01/25/2017   CREATININE 0.86 01/25/2017   CALCIUM 8.8 (L) 01/25/2017   PROT 6.5 12/06/2016   ALBUMIN 3.4 (L) 12/06/2016   AST 38 12/06/2016   ALT 22 12/06/2016   ALKPHOS 121 12/06/2016   BILITOT 0.4 12/06/2016   GFRNONAA >60 01/25/2017   GFRAA >60 01/25/2017    Lab Results  Component Value Date   WBC 5.1 05/04/2017   NEUTROABS 2.8 05/04/2017   HGB 12.9 05/04/2017   HCT 38.5 05/04/2017   MCV 81.7 05/04/2017   PLT 735 (H) 05/04/2017   Lab Results  Component Value Date   IRON 111 05/04/2017   TIBC 284 05/04/2017   IRONPCTSAT 39 (H) 05/04/2017   Lab Results  Component Value Date   FERRITIN 70 05/04/2017     STUDIES: No results found.  ASSESSMENT: JAK-2 positive essential thrombocytosis.   PLAN:    1. JAK-2 positive essential thrombocytosis: Patient's platelet count Has trended down over the last 4 weeks since initiating Hydrea. Previously, the remainder of her blood work, including peripheral blood flow cytometry,  was either negative or within normal limits. Continue 500 mg Hydrea daily. Return to clinic in 6 weeks for repeat laboratory work and then in 3 months for repeat laboratory work and further evaluation. 2. Iron deficiency anemia: Patient's hemoglobin and iron stores continue to be within normal limits. She last received IV Feraheme on December 06, 2016. No further intervention is needed.  3. Dementia: Patient appears to be at her baseline.   Patient expressed understanding and was in agreement with this plan. She also understands that She can call clinic at any time with any questions, concerns, or complaints.    Lloyd Huger, MD   05/04/2017 4:35 PM

## 2017-05-04 ENCOUNTER — Inpatient Hospital Stay: Payer: Medicare Other

## 2017-05-04 ENCOUNTER — Inpatient Hospital Stay: Payer: Medicare Other | Attending: Oncology | Admitting: Oncology

## 2017-05-04 VITALS — BP 128/72 | HR 72 | Temp 96.1°F | Resp 20 | Wt 107.3 lb

## 2017-05-04 DIAGNOSIS — Z79899 Other long term (current) drug therapy: Secondary | ICD-10-CM | POA: Insufficient documentation

## 2017-05-04 DIAGNOSIS — J449 Chronic obstructive pulmonary disease, unspecified: Secondary | ICD-10-CM | POA: Diagnosis not present

## 2017-05-04 DIAGNOSIS — I10 Essential (primary) hypertension: Secondary | ICD-10-CM | POA: Insufficient documentation

## 2017-05-04 DIAGNOSIS — E119 Type 2 diabetes mellitus without complications: Secondary | ICD-10-CM | POA: Insufficient documentation

## 2017-05-04 DIAGNOSIS — F0390 Unspecified dementia without behavioral disturbance: Secondary | ICD-10-CM | POA: Diagnosis not present

## 2017-05-04 DIAGNOSIS — D509 Iron deficiency anemia, unspecified: Secondary | ICD-10-CM | POA: Insufficient documentation

## 2017-05-04 DIAGNOSIS — D473 Essential (hemorrhagic) thrombocythemia: Secondary | ICD-10-CM

## 2017-05-04 LAB — CBC WITH DIFFERENTIAL/PLATELET
Basophils Absolute: 0.1 10*3/uL (ref 0–0.1)
Basophils Relative: 1 %
EOS PCT: 2 %
Eosinophils Absolute: 0.1 10*3/uL (ref 0–0.7)
HEMATOCRIT: 38.5 % (ref 35.0–47.0)
Hemoglobin: 12.9 g/dL (ref 12.0–16.0)
LYMPHS ABS: 1.4 10*3/uL (ref 1.0–3.6)
LYMPHS PCT: 28 %
MCH: 27.4 pg (ref 26.0–34.0)
MCHC: 33.6 g/dL (ref 32.0–36.0)
MCV: 81.7 fL (ref 80.0–100.0)
MONO ABS: 0.7 10*3/uL (ref 0.2–0.9)
MONOS PCT: 14 %
NEUTROS ABS: 2.8 10*3/uL (ref 1.4–6.5)
Neutrophils Relative %: 55 %
PLATELETS: 735 10*3/uL — AB (ref 150–440)
RBC: 4.72 MIL/uL (ref 3.80–5.20)
RDW: 18.1 % — ABNORMAL HIGH (ref 11.5–14.5)
WBC: 5.1 10*3/uL (ref 3.6–11.0)

## 2017-05-04 LAB — IRON AND TIBC
Iron: 111 ug/dL (ref 28–170)
Saturation Ratios: 39 % — ABNORMAL HIGH (ref 10.4–31.8)
TIBC: 284 ug/dL (ref 250–450)
UIBC: 173 ug/dL

## 2017-05-04 LAB — FERRITIN: Ferritin: 70 ng/mL (ref 11–307)

## 2017-05-04 NOTE — Progress Notes (Signed)
Patient denies any concerns today.  

## 2017-06-15 ENCOUNTER — Inpatient Hospital Stay: Payer: Medicare Other

## 2017-06-20 ENCOUNTER — Inpatient Hospital Stay: Payer: Medicare Other | Attending: Oncology

## 2017-06-20 DIAGNOSIS — D509 Iron deficiency anemia, unspecified: Secondary | ICD-10-CM | POA: Insufficient documentation

## 2017-06-20 DIAGNOSIS — D473 Essential (hemorrhagic) thrombocythemia: Secondary | ICD-10-CM | POA: Insufficient documentation

## 2017-06-20 LAB — CBC WITH DIFFERENTIAL/PLATELET
Basophils Absolute: 0 10*3/uL (ref 0–0.1)
Basophils Relative: 1 %
Eosinophils Absolute: 0.1 10*3/uL (ref 0–0.7)
Eosinophils Relative: 2 %
HEMATOCRIT: 38.8 % (ref 35.0–47.0)
HEMOGLOBIN: 12.8 g/dL (ref 12.0–16.0)
LYMPHS ABS: 0.8 10*3/uL — AB (ref 1.0–3.6)
Lymphocytes Relative: 21 %
MCH: 29.7 pg (ref 26.0–34.0)
MCHC: 32.9 g/dL (ref 32.0–36.0)
MCV: 90.2 fL (ref 80.0–100.0)
MONOS PCT: 15 %
Monocytes Absolute: 0.6 10*3/uL (ref 0.2–0.9)
NEUTROS ABS: 2.4 10*3/uL (ref 1.4–6.5)
NEUTROS PCT: 61 %
Platelets: 642 10*3/uL — ABNORMAL HIGH (ref 150–440)
RBC: 4.3 MIL/uL (ref 3.80–5.20)
RDW: 22.6 % — ABNORMAL HIGH (ref 11.5–14.5)
WBC: 3.8 10*3/uL (ref 3.6–11.0)

## 2017-08-09 NOTE — Progress Notes (Deleted)
[place]  [phone] [phone]  ID: Savannah Young OB: [birth year]  MR#: [id number]  GBT#:[id number]  Patient Care Team: Raelyn Number, MD as PCP - General (Internal Medicine)  CHIEF COMPLAINT: JAK-2 positive essential thrombocytosis.   INTERVAL HISTORY: Patient returns to clinic today for repeat laboratory work and further evaluation. She has baseline dementia and much of the history is given by her caretaker. She currently feels well and is asymptomatic. She is tolerating Hydrea.  She has no neurologic complaints. She denies any recent fevers or illnesses. She has a good appetite and denies weight loss. She has no chest pain or shortness of breath. She denies any weakness or fatigue. She has no nausea, vomiting, constipation, or diarrhea. She has no melena or hematochezia. Patient offers no specific complaints today.  REVIEW OF SYSTEMS:   Review of Systems  Constitutional: Negative.  Negative for chills, fever, malaise/fatigue and weight loss.  Respiratory: Negative.  Negative for cough and shortness of breath.   Cardiovascular: Negative for chest pain and leg swelling.  Gastrointestinal: Negative.  Negative for abdominal pain, blood in stool and melena.  Skin: Negative.  Negative for rash.  Neurological: Negative.  Negative for weakness.  Endo/Heme/Allergies: Does not bruise/bleed easily.  Psychiatric/Behavioral: Positive for memory loss. The patient is not nervous/anxious.     As per HPI. Otherwise, a complete review of systems is negative.  PAST MEDICAL HISTORY: Past Medical History:  Diagnosis Date  . COPD (chronic obstructive pulmonary disease) ([place])   . Dementia   . Diabetes mellitus ([place])   . Hypertension     PAST SURGICAL HISTORY: Past Surgical History:  Procedure Laterality Date  . ESOPHAGOGASTRODUODENOSCOPY (EGD) WITH PROPOFOL N/A 10/16/2016   Procedure: ESOPHAGOGASTRODUODENOSCOPY (EGD) WITH PROPOFOL;  Surgeon: San Jetty, MD;  Location: [facility] ENDOSCOPY;  Service: Gastroenterology;  Laterality: N/A;    FAMILY HISTORY: Family History  Problem Relation Age of Onset  . Hypertension Other     ADVANCED DIRECTIVES (Y/N):  N  HEALTH MAINTENANCE: Social History   Tobacco Use  . Smoking status: Never Smoker  . Smokeless tobacco: Former Network engineer Use Topics  . Alcohol use: No  . Drug use: No     Colonoscopy:  PAP:  Bone density:  Lipid panel:  No Known Allergies  Current Outpatient Medications  Medication Sig Dispense Refill  . acetaminophen (TYLENOL) 500 MG tablet Take 500 mg by mouth 3 (three) times daily as needed.    Marland Kitchen atorvastatin (LIPITOR) 20 MG tablet Take 1 tablet (20 mg total) by mouth daily. 30 tablet 0  . Cholecalciferol 50000 units TABS Take by mouth.    . Cranberry 425 MG CAPS Take 425 mg by mouth daily.    Marland Kitchen docusate sodium (COLACE) 100 MG capsule Take 1 capsule (100 mg total) by mouth 2 (two) times daily as needed for mild constipation. 10 capsule 0  . ferrous gluconate (FERGON) 324 MG tablet Take 240 mg by mouth daily with breakfast.    . fexofenadine (ALLEGRA) 180 MG tablet Take 180 mg by mouth daily.    . fluticasone (FLONASE) 50 MCG/ACT nasal spray Place 2 sprays into both nostrils daily.    . hydroxyurea (HYDREA) 500 MG capsule Take 1 capsule (500 mg total) by mouth daily. May take with food to minimize GI side effects. 30 capsule 3  . LORazepam (ATIVAN) 0.5 MG tablet Take 0.5 mg by mouth every 4 (four) hours as needed for anxiety.    . Melatonin  10 MG TABS Take 10 mg by mouth at bedtime.    . memantine (NAMENDA XR) 28 MG CP24 24 hr capsule Take 28 mg by mouth at bedtime.    . metoprolol tartrate (LOPRESSOR) 25 MG tablet Take 1 tablet (25 mg total) by mouth 2 (two) times daily. 60 tablet 6  . mirtazapine (REMERON) 15 MG tablet Take 15 mg by mouth at bedtime.    . pantoprazole (PROTONIX) 40 MG tablet Take 1 tablet (40 mg total) by mouth daily. 30 tablet 6  . QUEtiapine  (SEROQUEL) 25 MG tablet Take 0.5 tablets (12.5 mg total) by mouth daily before supper. 15 tablet 6   No current facility-administered medications for this visit.     OBJECTIVE: There were no vitals filed for this visit.   There is no height or weight on file to calculate BMI.    ECOG FS:1 - Symptomatic but completely ambulatory  General: Well-developed, well-nourished, no acute distress. Eyes: Pink conjunctiva, anicteric sclera. Lungs: Clear to auscultation bilaterally. Heart: Regular rate and rhythm. No rubs, murmurs, or gallops. Abdomen: Soft, nontender, nondistended. No organomegaly noted, normoactive bowel sounds. Musculoskeletal: No edema, cyanosis, or clubbing. Neuro: Alert, but confused. Cranial nerves grossly intact. Skin: No rashes or petechiae noted. Psych: Normal affect.  LAB RESULTS:  Lab Results  Component Value Date   NA 143 01/25/2017   K 3.9 01/25/2017   CL 112 (H) 01/25/2017   CO2 24 01/25/2017   GLUCOSE 99 01/25/2017   BUN 13 01/25/2017   CREATININE 0.86 01/25/2017   CALCIUM 8.8 (L) 01/25/2017   PROT 6.5 12/06/2016   ALBUMIN 3.4 (L) 12/06/2016   AST 38 12/06/2016   ALT 22 12/06/2016   ALKPHOS 121 12/06/2016   BILITOT 0.4 12/06/2016   GFRNONAA >60 01/25/2017   GFRAA >60 01/25/2017    Lab Results  Component Value Date   WBC 3.8 06/20/2017   NEUTROABS 2.4 06/20/2017   HGB 12.8 06/20/2017   HCT 38.8 06/20/2017   MCV 90.2 06/20/2017   PLT 642 (H) 06/20/2017   Lab Results  Component Value Date   IRON 111 05/04/2017   TIBC 284 05/04/2017   IRONPCTSAT 39 (H) 05/04/2017   Lab Results  Component Value Date   FERRITIN 70 05/04/2017     STUDIES: No results found.  ASSESSMENT: JAK-2 positive essential thrombocytosis.   PLAN:    1. JAK-2 positive essential thrombocytosis: Patient's platelet count Has trended down over the last 4 weeks since initiating Hydrea. Previously, the remainder of her blood work, including peripheral blood flow cytometry,  was either negative or within normal limits. Continue 500 mg Hydrea daily. Return to clinic in 6 weeks for repeat laboratory work and then in 3 months for repeat laboratory work and further evaluation. 2. Iron deficiency anemia: Patient's hemoglobin and iron stores continue to be within normal limits. She last received IV Feraheme on December 06, 2016. No further intervention is needed.  3. Dementia: Patient appears to be at her baseline.   Patient expressed understanding and was in agreement with this plan. She also understands that She can call clinic at any time with any questions, concerns, or complaints.    Lloyd Huger, MD   08/09/2017 10:42 PM

## 2017-08-10 ENCOUNTER — Inpatient Hospital Stay: Payer: Medicare Other

## 2017-08-10 ENCOUNTER — Inpatient Hospital Stay: Payer: Medicare Other | Admitting: Oncology

## 2017-09-03 NOTE — Progress Notes (Unsigned)
[place]  [phone] [phone]  ID: Neil Crouch OB: 24-May-1932  MR#: [id number]  NLZ#:[id number]  Patient Care Team: Raelyn Number, MD as PCP - General (Internal Medicine)  CHIEF COMPLAINT: JAK-2 positive essential thrombocytosis.   INTERVAL HISTORY: Patient returns to clinic today for repeat laboratory work and further evaluation. She has baseline dementia and much of the history is given by her caretaker. She currently feels well and is asymptomatic. She is tolerating Hydrea.  She has no neurologic complaints. She denies any recent fevers or illnesses. She has a good appetite and denies weight loss. She has no chest pain or shortness of breath. She denies any weakness or fatigue. She has no nausea, vomiting, constipation, or diarrhea. She has no melena or hematochezia. Patient offers no specific complaints today.  REVIEW OF SYSTEMS:   Review of Systems  Constitutional: Negative.  Negative for chills, fever, malaise/fatigue and weight loss.  Respiratory: Negative.  Negative for cough and shortness of breath.   Cardiovascular: Negative for chest pain and leg swelling.  Gastrointestinal: Negative.  Negative for abdominal pain, blood in stool and melena.  Skin: Negative.  Negative for rash.  Neurological: Negative.  Negative for weakness.  Endo/Heme/Allergies: Does not bruise/bleed easily.  Psychiatric/Behavioral: Positive for memory loss. The patient is not nervous/anxious.     As per HPI. Otherwise, a complete review of systems is negative.  PAST MEDICAL HISTORY: Past Medical History:  Diagnosis Date  . COPD (chronic obstructive pulmonary disease) ([place])   . Dementia   . Diabetes mellitus ([place])   . Hypertension     PAST SURGICAL HISTORY: Past Surgical History:  Procedure Laterality Date  . ESOPHAGOGASTRODUODENOSCOPY (EGD) WITH PROPOFOL N/A 10/16/2016   Procedure: ESOPHAGOGASTRODUODENOSCOPY (EGD) WITH PROPOFOL;  Surgeon: San Jetty, MD;  Location: [facility] ENDOSCOPY;  Service: Gastroenterology;  Laterality: N/A;    FAMILY HISTORY: Family History  Problem Relation Age of Onset  . Hypertension Other     ADVANCED DIRECTIVES (Y/N):  N  HEALTH MAINTENANCE: Social History   Tobacco Use  . Smoking status: Never Smoker  . Smokeless tobacco: Former Network engineer Use Topics  . Alcohol use: No  . Drug use: No     Colonoscopy:  PAP:  Bone density:  Lipid panel:  No Known Allergies  Current Outpatient Medications  Medication Sig Dispense Refill  . acetaminophen (TYLENOL) 500 MG tablet Take 500 mg by mouth 3 (three) times daily as needed.    Marland Kitchen atorvastatin (LIPITOR) 20 MG tablet Take 1 tablet (20 mg total) by mouth daily. 30 tablet 0  . Cholecalciferol 50000 units TABS Take by mouth.    . Cranberry 425 MG CAPS Take 425 mg by mouth daily.    Marland Kitchen docusate sodium (COLACE) 100 MG capsule Take 1 capsule (100 mg total) by mouth 2 (two) times daily as needed for mild constipation. 10 capsule 0  . ferrous gluconate (FERGON) 324 MG tablet Take 240 mg by mouth daily with breakfast.    . fexofenadine (ALLEGRA) 180 MG tablet Take 180 mg by mouth daily.    . fluticasone (FLONASE) 50 MCG/ACT nasal spray Place 2 sprays into both nostrils daily.    . hydroxyurea (HYDREA) 500 MG capsule Take 1 capsule (500 mg total) by mouth daily. May take with food to minimize GI side effects. 30 capsule 3  . LORazepam (ATIVAN) 0.5 MG tablet Take 0.5 mg by mouth every 4 (four) hours as needed for anxiety.    . Melatonin  10 MG TABS Take 10 mg by mouth at bedtime.    . memantine (NAMENDA XR) 28 MG CP24 24 hr capsule Take 28 mg by mouth at bedtime.    . metoprolol tartrate (LOPRESSOR) 25 MG tablet Take 1 tablet (25 mg total) by mouth 2 (two) times daily. 60 tablet 6  . mirtazapine (REMERON) 15 MG tablet Take 15 mg by mouth at bedtime.    . pantoprazole (PROTONIX) 40 MG tablet Take 1 tablet (40 mg total) by mouth daily. 30 tablet 6  . QUEtiapine  (SEROQUEL) 25 MG tablet Take 0.5 tablets (12.5 mg total) by mouth daily before supper. 15 tablet 6   No current facility-administered medications for this visit.     OBJECTIVE: There were no vitals filed for this visit.   There is no height or weight on file to calculate BMI.    ECOG FS:1 - Symptomatic but completely ambulatory  General: Well-developed, well-nourished, no acute distress. Eyes: Pink conjunctiva, anicteric sclera. Lungs: Clear to auscultation bilaterally. Heart: Regular rate and rhythm. No rubs, murmurs, or gallops. Abdomen: Soft, nontender, nondistended. No organomegaly noted, normoactive bowel sounds. Musculoskeletal: No edema, cyanosis, or clubbing. Neuro: Alert, but confused. Cranial nerves grossly intact. Skin: No rashes or petechiae noted. Psych: Normal affect.  LAB RESULTS:  Lab Results  Component Value Date   NA 143 01/25/2017   K 3.9 01/25/2017   CL 112 (H) 01/25/2017   CO2 24 01/25/2017   GLUCOSE 99 01/25/2017   BUN 13 01/25/2017   CREATININE 0.86 01/25/2017   CALCIUM 8.8 (L) 01/25/2017   PROT 6.5 12/06/2016   ALBUMIN 3.4 (L) 12/06/2016   AST 38 12/06/2016   ALT 22 12/06/2016   ALKPHOS 121 12/06/2016   BILITOT 0.4 12/06/2016   GFRNONAA >60 01/25/2017   GFRAA >60 01/25/2017    Lab Results  Component Value Date   WBC 3.8 06/20/2017   NEUTROABS 2.4 06/20/2017   HGB 12.8 06/20/2017   HCT 38.8 06/20/2017   MCV 90.2 06/20/2017   PLT 642 (H) 06/20/2017   Lab Results  Component Value Date   IRON 111 05/04/2017   TIBC 284 05/04/2017   IRONPCTSAT 39 (H) 05/04/2017   Lab Results  Component Value Date   FERRITIN 70 05/04/2017     STUDIES: No results found.  ASSESSMENT: JAK-2 positive essential thrombocytosis.   PLAN:    1. JAK-2 positive essential thrombocytosis: Patient's platelet count Has trended down over the last 4 weeks since initiating Hydrea. Previously, the remainder of her blood work, including peripheral blood flow cytometry,  was either negative or within normal limits. Continue 500 mg Hydrea daily. Return to clinic in 6 weeks for repeat laboratory work and then in 3 months for repeat laboratory work and further evaluation. 2. Iron deficiency anemia: Patient's hemoglobin and iron stores continue to be within normal limits. She last received IV Feraheme on December 06, 2016. No further intervention is needed.  3. Dementia: Patient appears to be at her baseline.   Patient expressed understanding and was in agreement with this plan. She also understands that She can call clinic at any time with any questions, concerns, or complaints.    Lloyd Huger, MD   09/03/2017 10:37 AM

## 2017-09-04 ENCOUNTER — Inpatient Hospital Stay: Payer: Medicare Other | Attending: Oncology

## 2017-09-04 ENCOUNTER — Inpatient Hospital Stay: Payer: Medicare Other | Admitting: Oncology

## 2017-09-04 DIAGNOSIS — D473 Essential (hemorrhagic) thrombocythemia: Secondary | ICD-10-CM | POA: Diagnosis present

## 2017-09-04 DIAGNOSIS — D509 Iron deficiency anemia, unspecified: Secondary | ICD-10-CM | POA: Insufficient documentation

## 2017-09-04 LAB — CBC WITH DIFFERENTIAL/PLATELET
BASOS PCT: 2 %
Basophils Absolute: 0.1 10*3/uL (ref 0–0.1)
EOS ABS: 0.1 10*3/uL (ref 0–0.7)
Eosinophils Relative: 1 %
HCT: 38 % (ref 35.0–47.0)
HEMOGLOBIN: 12.5 g/dL (ref 12.0–16.0)
LYMPHS ABS: 1.7 10*3/uL (ref 1.0–3.6)
Lymphocytes Relative: 27 %
MCH: 32 pg (ref 26.0–34.0)
MCHC: 32.9 g/dL (ref 32.0–36.0)
MCV: 97.5 fL (ref 80.0–100.0)
MONOS PCT: 10 %
Monocytes Absolute: 0.6 10*3/uL (ref 0.2–0.9)
NEUTROS PCT: 60 %
Neutro Abs: 4 10*3/uL (ref 1.4–6.5)
PLATELETS: 748 10*3/uL — AB (ref 150–440)
RBC: 3.89 MIL/uL (ref 3.80–5.20)
RDW: 15.6 % — ABNORMAL HIGH (ref 11.5–14.5)
WBC: 6.6 10*3/uL (ref 3.6–11.0)

## 2017-09-06 ENCOUNTER — Ambulatory Visit: Payer: Medicare Other | Admitting: Oncology

## 2018-01-29 IMAGING — CT CT HEAD W/O CM
3 series · 15 of 43 positions shown, 18 images · non-contrast
Comparison: 03/07/2016.

CLINICAL DATA: Altered mental status.  Dementia.

EXAM:
CT HEAD WITHOUT CONTRAST
TECHNIQUE: Contiguous axial images were obtained from the base of the skull
through the vertex without intravenous contrast.

[Series 3: head wo · axial · 0.38mm/px · z∈[-135,-30]mm · 9 of 26 slices shown, 12 images]
[im 3/26  brain]
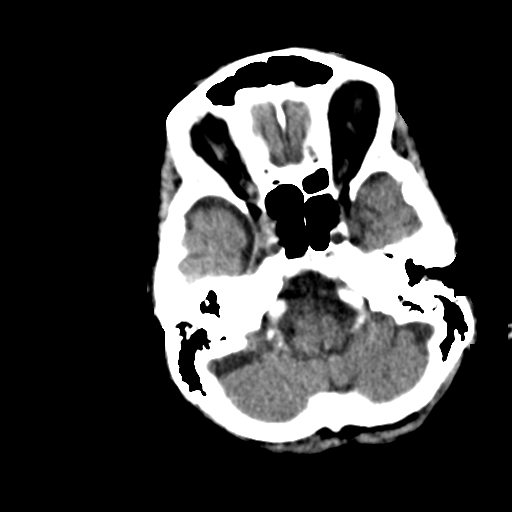
[im 3/26  bone]
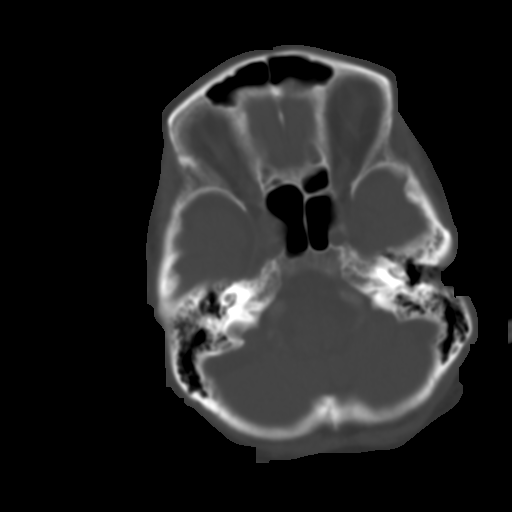
[im 6/26  brain]
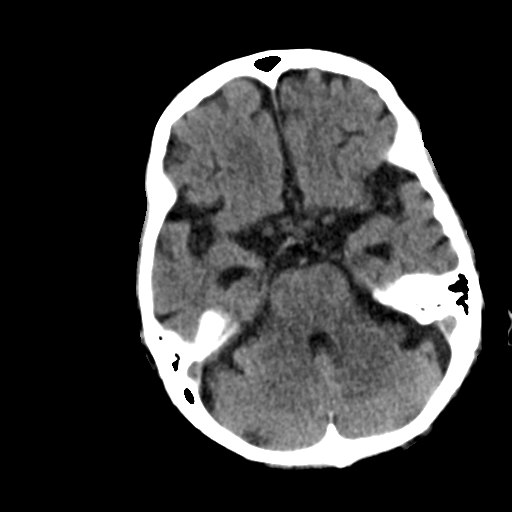
[im 8/26  brain]
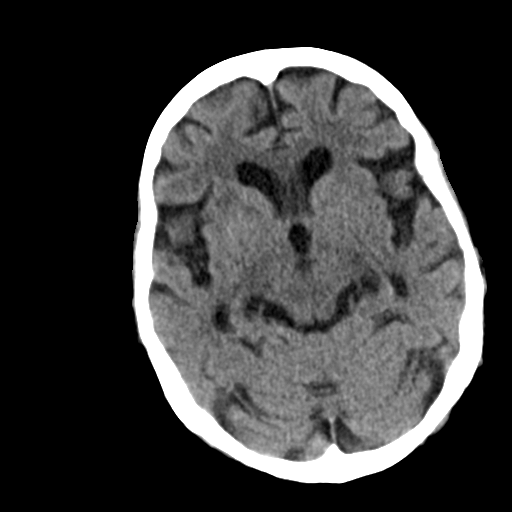
[im 11/26  brain]
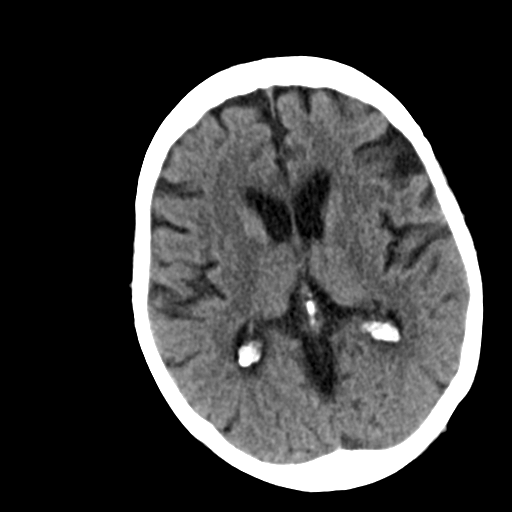
[im 14/26  brain]
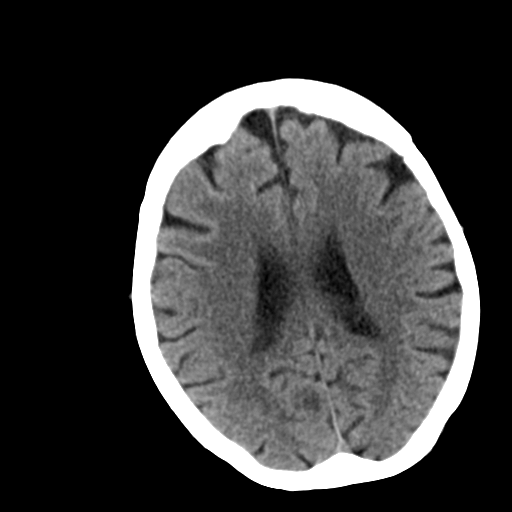
[im 14/26  bone]
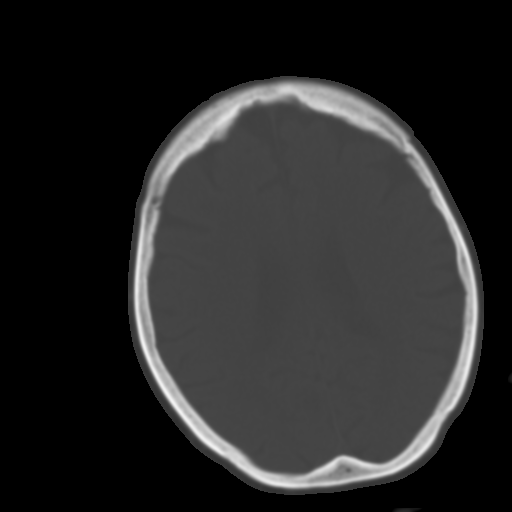
[im 16/26  brain]
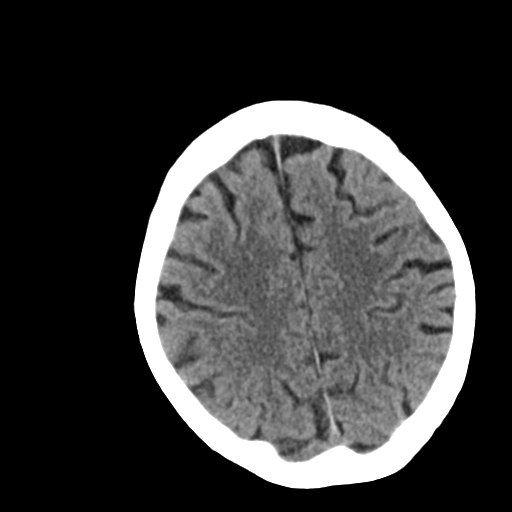
[im 19/26  brain]
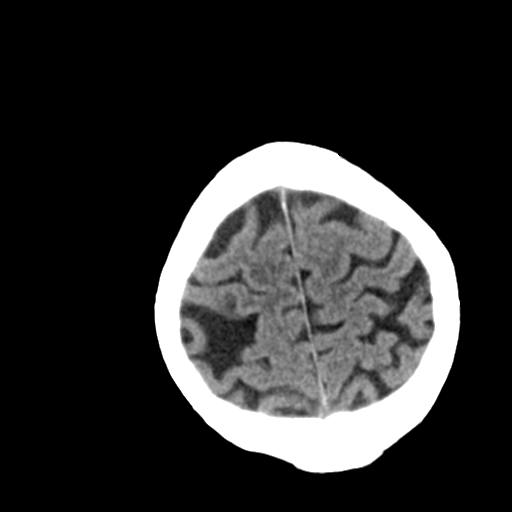
[im 22/26  brain]
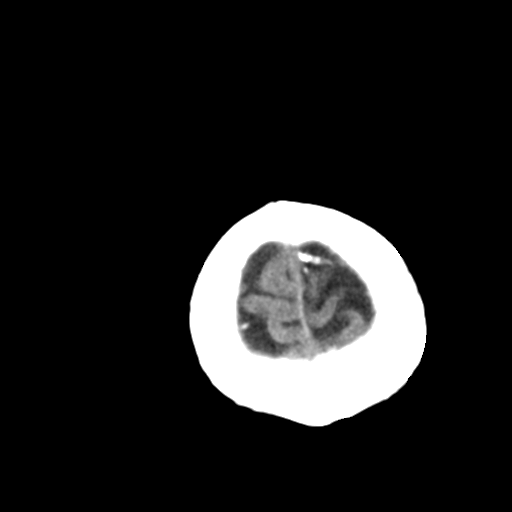
[im 24/26  brain]
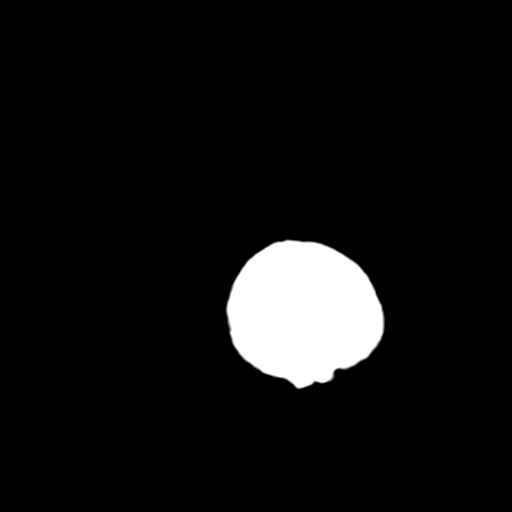
[im 24/26  bone]
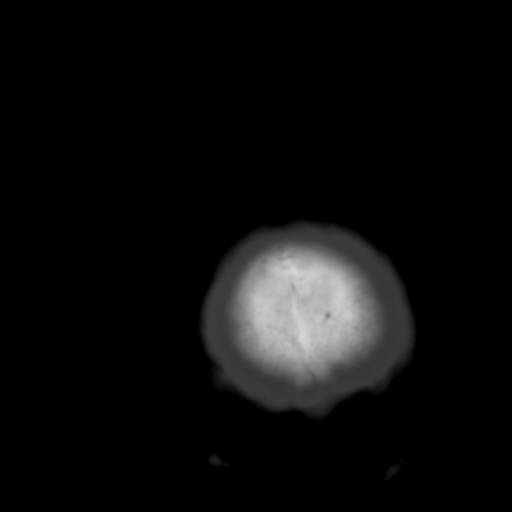

[Series 5: coronal soft tissue · coronal · 0.26mm/px · 3 of 67 slices shown]
[im 23/67  brain]
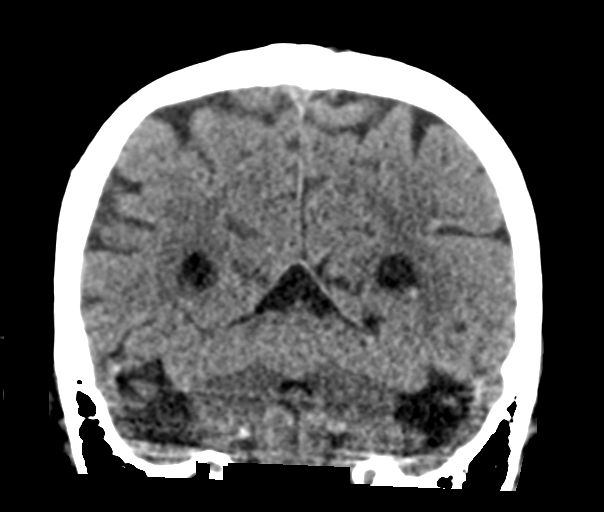
[im 30/67  brain]
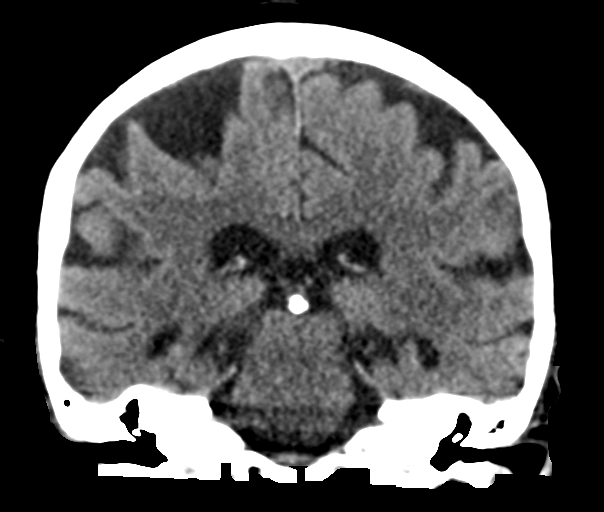
[im 37/67  brain]
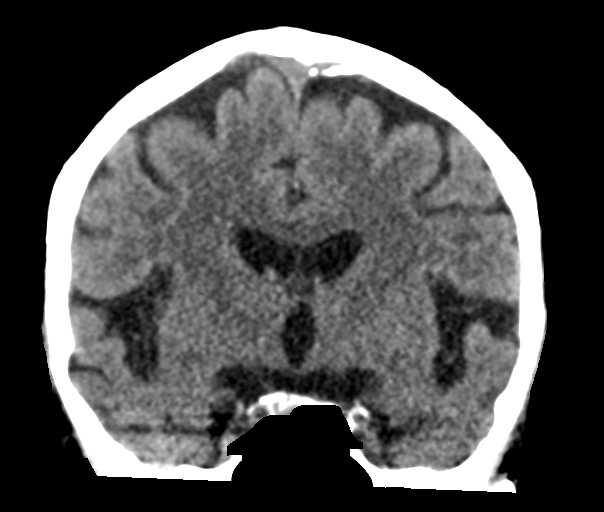

[Series 6: sagittal soft tissue · sagittal · 0.29mm/px · 3 of 64 slices shown]
[im 22/64  brain]
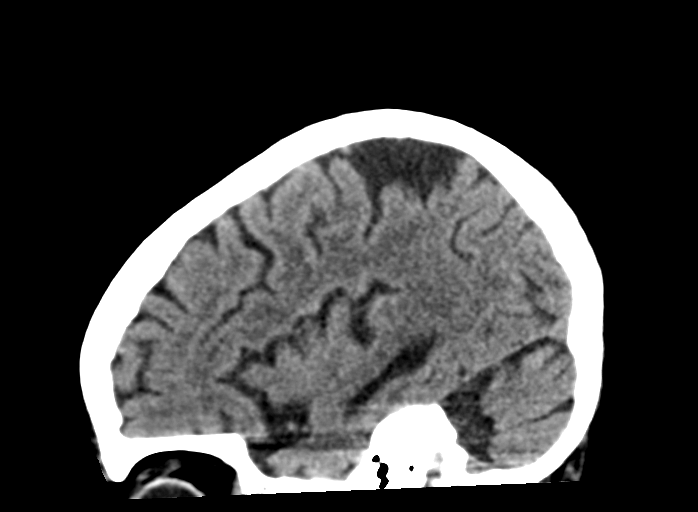
[im 32/64  brain]
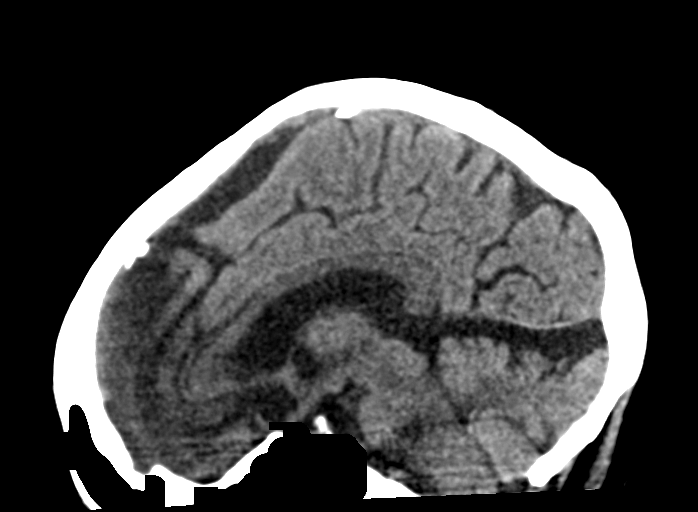
[im 43/64  brain]
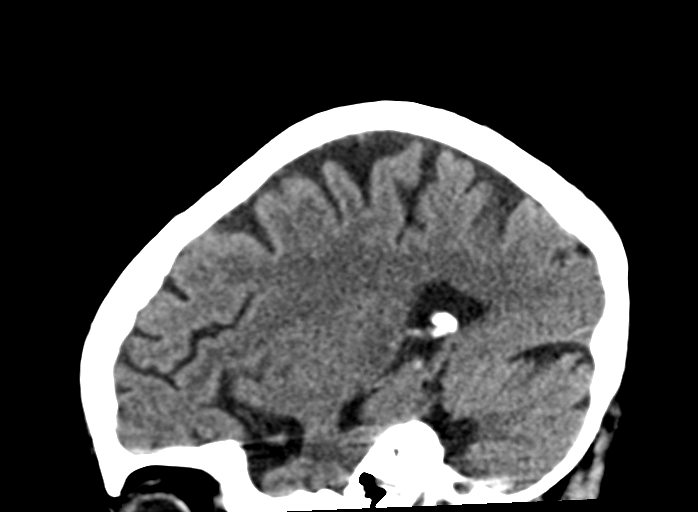

[15 of 43 positions shown; findings below may reference images not displayed]

FINDINGS: Brain: No evidence for acute infarction, hemorrhage, mass lesion,
hydrocephalus, or extra-axial fluid. Advanced atrophy. Chronic
microvascular ischemic change.

Vascular: No hyperdense vessel. Vascular calcification not
unexpected for age.

Skull: Normal. Negative for fracture or focal lesion.

Sinuses/Orbits: No acute finding.

Other: None.  Compared with priors, similar appearance.
IMPRESSION: Negative exam.

## 2018-01-29 IMAGING — DX DG CHEST 1V
1 series · 1 of 1 positions shown · non-contrast
Comparison: 03/07/2016 .

CLINICAL DATA: Altered mental status.  Weakness.

EXAM:
CHEST 1 VIEW

[chest ap]
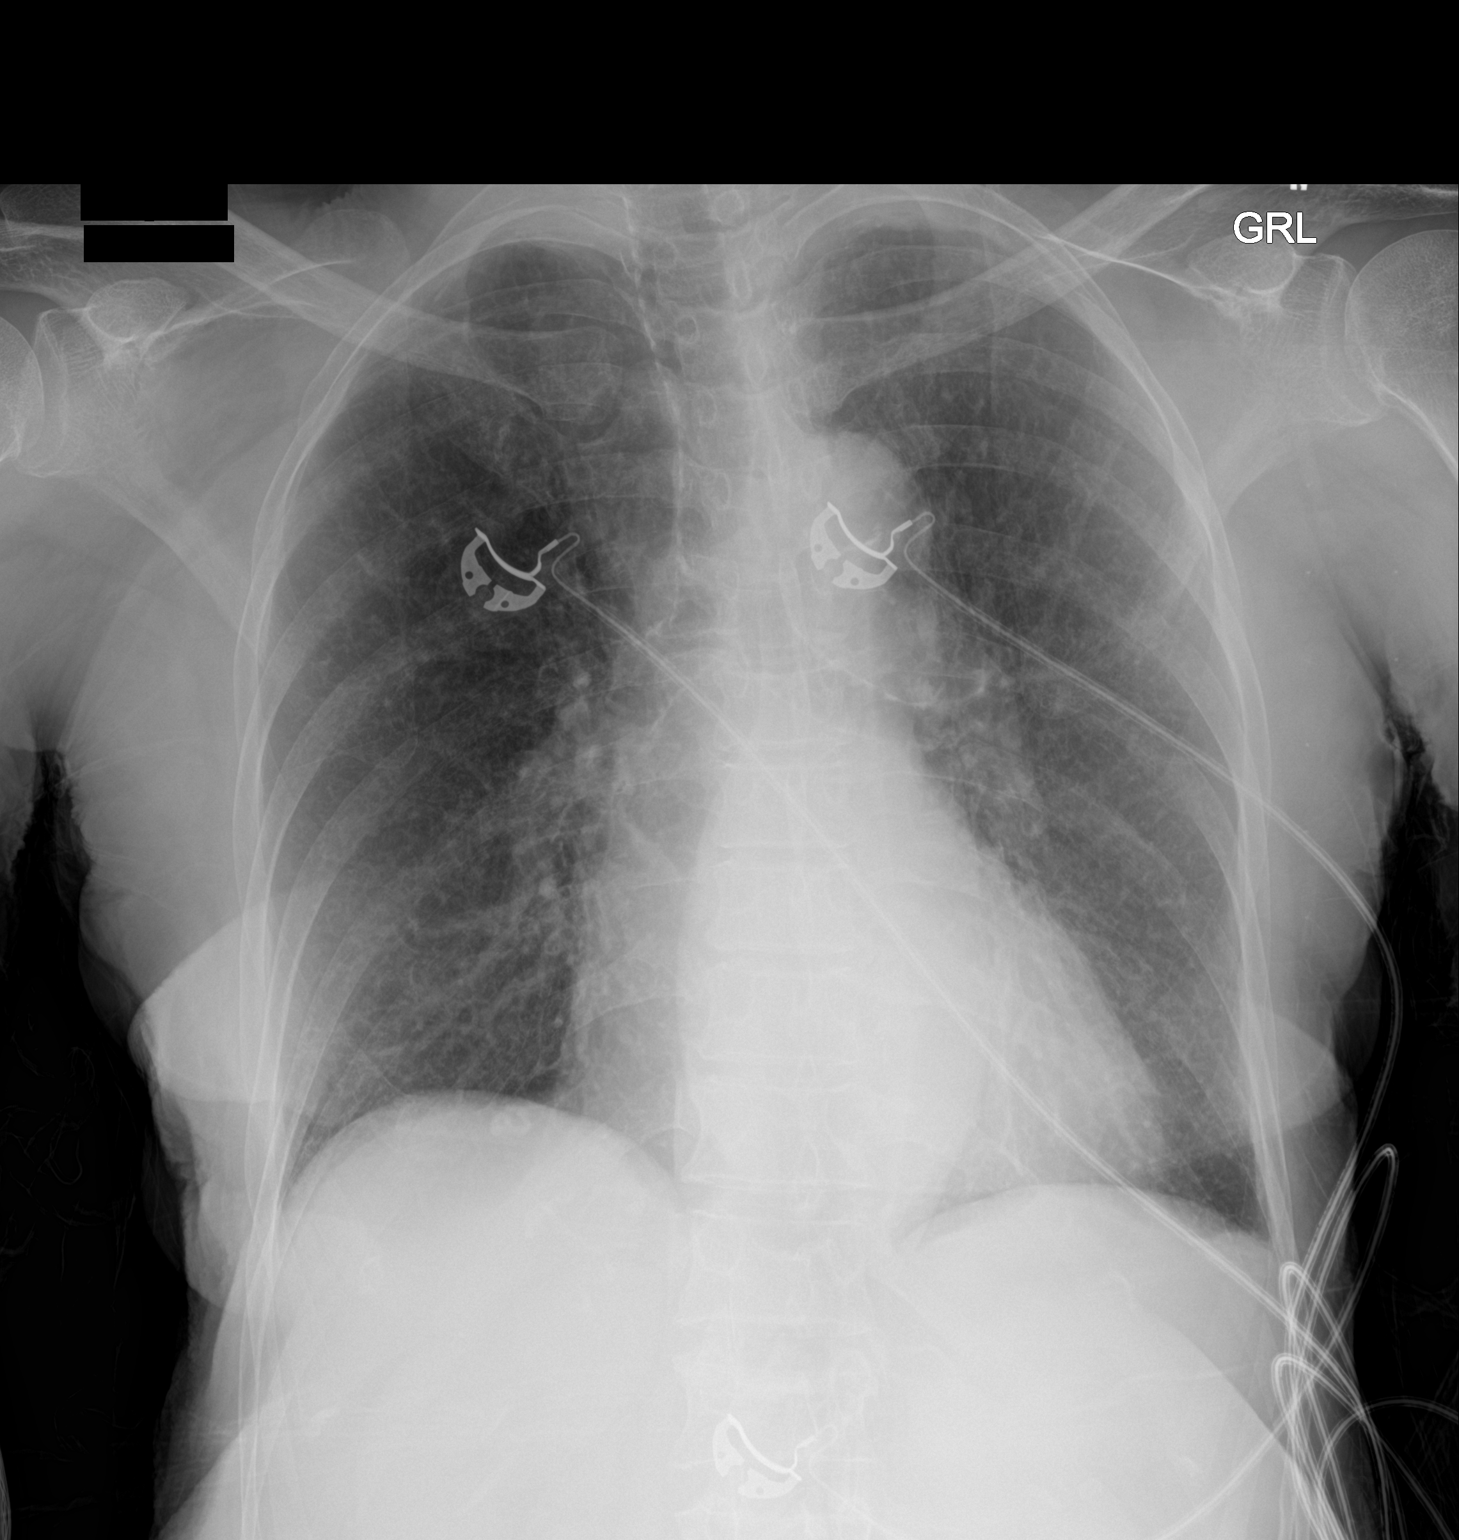

[1 of 1 positions shown; findings below may reference images not displayed]

FINDINGS: Cardiomegaly with normal pulmonary vascularity. No focal infiltrate.
No pleural effusion or pneumothorax. Degenerative changes thoracic
spine.
IMPRESSION: Cardiomegaly. No overt congestive heart failure. No focal pulmonary
infiltrate .

## 2018-12-14 ENCOUNTER — Emergency Department: Payer: Medicare Other

## 2018-12-14 ENCOUNTER — Inpatient Hospital Stay
Admission: EM | Admit: 2018-12-14 | Discharge: 2018-12-18 | DRG: 470 | Disposition: A | Discharge status: Skilled Nursing Facility | Payer: Medicare Other | Source: Skilled Nursing Facility | Attending: Specialist | Admitting: Specialist

## 2018-12-14 ENCOUNTER — Other Ambulatory Visit: Payer: Self-pay

## 2018-12-14 DIAGNOSIS — J449 Chronic obstructive pulmonary disease, unspecified: Secondary | ICD-10-CM | POA: Diagnosis present

## 2018-12-14 DIAGNOSIS — W1830XA Fall on same level, unspecified, initial encounter: Secondary | ICD-10-CM | POA: Diagnosis present

## 2018-12-14 DIAGNOSIS — F0390 Unspecified dementia without behavioral disturbance: Secondary | ICD-10-CM | POA: Diagnosis present

## 2018-12-14 DIAGNOSIS — Z7951 Long term (current) use of inhaled steroids: Secondary | ICD-10-CM | POA: Diagnosis not present

## 2018-12-14 DIAGNOSIS — D473 Essential (hemorrhagic) thrombocythemia: Secondary | ICD-10-CM | POA: Diagnosis present

## 2018-12-14 DIAGNOSIS — Z8673 Personal history of transient ischemic attack (TIA), and cerebral infarction without residual deficits: Secondary | ICD-10-CM | POA: Diagnosis not present

## 2018-12-14 DIAGNOSIS — Z79899 Other long term (current) drug therapy: Secondary | ICD-10-CM

## 2018-12-14 DIAGNOSIS — Z8249 Family history of ischemic heart disease and other diseases of the circulatory system: Secondary | ICD-10-CM

## 2018-12-14 DIAGNOSIS — S72009A Fracture of unspecified part of neck of unspecified femur, initial encounter for closed fracture: Secondary | ICD-10-CM

## 2018-12-14 DIAGNOSIS — Y92129 Unspecified place in nursing home as the place of occurrence of the external cause: Secondary | ICD-10-CM

## 2018-12-14 DIAGNOSIS — Z87891 Personal history of nicotine dependence: Secondary | ICD-10-CM | POA: Diagnosis not present

## 2018-12-14 DIAGNOSIS — E119 Type 2 diabetes mellitus without complications: Secondary | ICD-10-CM

## 2018-12-14 DIAGNOSIS — D62 Acute posthemorrhagic anemia: Secondary | ICD-10-CM | POA: Diagnosis not present

## 2018-12-14 DIAGNOSIS — S72001A Fracture of unspecified part of neck of right femur, initial encounter for closed fracture: Principal | ICD-10-CM | POA: Diagnosis present

## 2018-12-14 DIAGNOSIS — I10 Essential (primary) hypertension: Secondary | ICD-10-CM | POA: Diagnosis present

## 2018-12-14 DIAGNOSIS — K219 Gastro-esophageal reflux disease without esophagitis: Secondary | ICD-10-CM | POA: Diagnosis present

## 2018-12-14 DIAGNOSIS — M25551 Pain in right hip: Secondary | ICD-10-CM | POA: Diagnosis present

## 2018-12-14 DIAGNOSIS — W19XXXA Unspecified fall, initial encounter: Secondary | ICD-10-CM

## 2018-12-14 LAB — PROTIME-INR
INR: 1.2 (ref 0.8–1.2)
Prothrombin Time: 15.2 seconds (ref 11.4–15.2)

## 2018-12-14 LAB — TROPONIN I: Troponin I: <0.03 ng/mL (ref <0.03)

## 2018-12-14 LAB — BASIC METABOLIC PANEL
Anion gap: 9 (ref 5–15)
BUN: 18 mg/dL (ref 8–23)
CO2: 19 mmol/L — ABNORMAL LOW (ref 22–32)
Calcium: 7.5 mg/dL — ABNORMAL LOW (ref 8.9–10.3)
Chloride: 117 mmol/L — ABNORMAL HIGH (ref 98–111)
Creatinine, Ser: 0.71 mg/dL (ref 0.44–1.00)
GFR calc Af Amer: >60 mL/min (ref >60)
GFR calc non Af Amer: >60 mL/min (ref >60)
Glucose, Bld: 125 mg/dL — ABNORMAL HIGH (ref 70–99)
Potassium: 3.7 mmol/L (ref 3.5–5.1)
Sodium: 145 mmol/L (ref 135–145)

## 2018-12-14 LAB — CBC WITH DIFFERENTIAL/PLATELET
Abs Immature Granulocytes: 0.05 10*3/uL (ref 0.00–0.07)
Basophils Absolute: 0 10*3/uL (ref 0.0–0.1)
Basophils Relative: 0 %
Eosinophils Absolute: 0 10*3/uL (ref 0.0–0.5)
Eosinophils Relative: 0 %
HCT: 37.8 % (ref 36.0–46.0)
Hemoglobin: 11.8 g/dL — ABNORMAL LOW (ref 12.0–15.0)
Immature Granulocytes: 1 %
Lymphocytes Relative: 9 %
Lymphs Abs: 0.8 10*3/uL (ref 0.7–4.0)
MCH: 28.9 pg (ref 26.0–34.0)
MCHC: 31.2 g/dL (ref 30.0–36.0)
MCV: 92.4 fL (ref 80.0–100.0)
Monocytes Absolute: 0.7 10*3/uL (ref 0.1–1.0)
Monocytes Relative: 8 %
Neutro Abs: 8 10*3/uL — ABNORMAL HIGH (ref 1.7–7.7)
Neutrophils Relative %: 82 %
Platelets: 942 10*3/uL (ref 150–400)
RBC: 4.09 MIL/uL (ref 3.87–5.11)
RDW: 16 % — ABNORMAL HIGH (ref 11.5–15.5)
WBC: 9.7 10*3/uL (ref 4.0–10.5)
nRBC: 0 % (ref 0.0–0.2)

## 2018-12-14 LAB — APTT: aPTT: 29 seconds (ref 24–36)

## 2018-12-14 LAB — URINALYSIS, COMPLETE (UACMP) WITH MICROSCOPIC
Bacteria, UA: NONE SEEN
Bilirubin Urine: NEGATIVE
Glucose, UA: NEGATIVE mg/dL
Hgb urine dipstick: NEGATIVE
Ketones, ur: 20 mg/dL — AB
Leukocytes,Ua: NEGATIVE
Nitrite: NEGATIVE
Protein, ur: NEGATIVE mg/dL
Specific Gravity, Urine: 1.026 (ref 1.005–1.030)
Squamous Epithelial / LPF: NONE SEEN (ref 0–5)
pH: 5 (ref 5.0–8.0)

## 2018-12-14 NOTE — ED Notes (Signed)
Repeat green top obtained for lab.

## 2018-12-14 NOTE — ED Triage Notes (Signed)
PT to ED via EMS from [facility]. Called out for c/o altered mental status. Facility states that pt will not feed herself. Unwitnessed fall this morning. Pt  Takes 81mg  aspirin. HX of alzheimer's/dementia. Baseline AOx1 per EMS.

## 2018-12-14 NOTE — H&P (Signed)
[facility] [place] NAME: Savannah Young    MR#:  [id number]  DATE OF BIRTH:  12/07/1931  DATE OF ADMISSION:  12/14/2018  PRIMARY CARE PHYSICIAN: Raelyn Number, MD   REQUESTING/REFERRING PHYSICIAN: Archie Balboa, MD  CHIEF COMPLAINT:   Chief Complaint  Patient presents with  . Fall  . Altered Mental Status    HISTORY OF PRESENT ILLNESS:  Savannah Young  is a 85 y.o. female who presents with chief complaint as above.  Patient brought in from living facility due to fall.  She has dementia and is unable to contribute any significant information to her history.  On evaluation here in the ED she was found to have a right hip fracture.  Orthopedic surgery was contacted by ED physician and hospitalist were called for admission  PAST MEDICAL HISTORY:   Past Medical History:  Diagnosis Date  . COPD (chronic obstructive pulmonary disease) ([place])   . Dementia ([place])   . Diabetes mellitus ([place])   . Hypertension      PAST SURGICAL HISTORY:   Past Surgical History:  Procedure Laterality Date  . ESOPHAGOGASTRODUODENOSCOPY (EGD) WITH PROPOFOL N/A 10/16/2016   Procedure: ESOPHAGOGASTRODUODENOSCOPY (EGD) WITH PROPOFOL;  Surgeon: San Jetty, MD;  Location: [facility] ENDOSCOPY;  Service: Gastroenterology;  Laterality: N/A;     SOCIAL HISTORY:   Social History   Tobacco Use  . Smoking status: Never Smoker  . Smokeless tobacco: Former Network engineer Use Topics  . Alcohol use: No     FAMILY HISTORY:   Family History  Problem Relation Age of Onset  . Hypertension Other      DRUG ALLERGIES:  No Known Allergies  MEDICATIONS AT HOME:   Prior to Admission medications   Medication Sig Start Date End Date Taking? Authorizing Provider  acetaminophen (TYLENOL) 500 MG tablet Take 500 mg by mouth 3 (three) times daily as needed.    [provider]  atorvastatin (LIPITOR) 20 MG tablet Take 1 tablet (20 mg total) by mouth daily.  02/24/16   Theodoro Grist, MD  Cholecalciferol 50000 units TABS Take by mouth.    [provider]  Cranberry 425 MG CAPS Take 425 mg by mouth daily.    [provider]  docusate sodium (COLACE) 100 MG capsule Take 1 capsule (100 mg total) by mouth 2 (two) times daily as needed for mild constipation. 10/16/16   Theodoro Grist, MD  ferrous gluconate (FERGON) 324 MG tablet Take 240 mg by mouth daily with breakfast.    [provider]  fexofenadine (ALLEGRA) 180 MG tablet Take 180 mg by mouth daily.    [provider]  fluticasone (FLONASE) 50 MCG/ACT nasal spray Place 2 sprays into both nostrils daily.    [provider]  hydroxyurea (HYDREA) 500 MG capsule Take 1 capsule (500 mg total) by mouth daily. May take with food to minimize GI side effects. 04/04/17   Lloyd Huger, MD  LORazepam (ATIVAN) 0.5 MG tablet Take 0.5 mg by mouth every 4 (four) hours as needed for anxiety.    [provider]  Melatonin 10 MG TABS Take 10 mg by mouth at bedtime.    [provider]  memantine (NAMENDA XR) 28 MG CP24 24 hr capsule Take 28 mg by mouth at bedtime.    [provider]  metoprolol tartrate (LOPRESSOR) 25 MG tablet Take 1 tablet (25 mg total) by mouth 2 (two) times daily. 10/16/16   Theodoro Grist, MD  mirtazapine (REMERON) 15 MG tablet Take 15 mg by mouth at bedtime.    [provider]  pantoprazole (PROTONIX) 40 MG tablet Take 1 tablet (40 mg total) by mouth daily. 10/16/16   Theodoro Grist, MD  QUEtiapine (SEROQUEL) 25 MG tablet Take 0.5 tablets (12.5 mg total) by mouth daily before supper. 10/16/16   Theodoro Grist, MD    REVIEW OF SYSTEMS:  Review of Systems  Unable to perform ROS: Dementia     VITAL SIGNS:   Vitals:   12/14/18 1938 12/14/18 1941 12/14/18 2130 12/14/18 2230  BP:  (!) 140/92 118/61 124/70  Pulse:   84 99  Resp:  16 (!) 21 (!) 21  Temp:  98.9 F (37.2 C)    TempSrc:  Oral    SpO2:   96% 96%   Weight: 45.4 kg     Height: 5\' 5"  (1.651 m)      Wt Readings from Last 3 Encounters:  12/14/18 45.4 kg  05/04/17 48.7 kg  04/04/17 48.6 kg    PHYSICAL EXAMINATION:  Physical Exam  Vitals reviewed. Constitutional: She is oriented to person, place, and time. She appears well-developed and well-nourished. No distress.  HENT:  Head: Normocephalic and atraumatic.  Mouth/Throat: Oropharynx is clear and moist.  Eyes: Pupils are equal, round, and reactive to light. Conjunctivae and EOM are normal. No scleral icterus.  Neck: Normal range of motion. Neck supple. No JVD present. No thyromegaly present.  Cardiovascular: Normal rate, regular rhythm and intact distal pulses. Exam reveals no gallop and no friction rub.  No murmur heard. Respiratory: Effort normal and breath sounds normal. No respiratory distress. She has no wheezes. She has no rales.  GI: Soft. Bowel sounds are normal. She exhibits no distension. There is no abdominal tenderness.  Musculoskeletal: Normal range of motion.        General: Tenderness (right hip) present. No edema.     Comments: No arthritis, no gout  Lymphadenopathy:    She has no cervical adenopathy.  Neurological: She is alert and oriented to person, place, and time. No cranial nerve deficit.  No dysarthria, no aphasia  Skin: Skin is warm and dry. No rash noted. No erythema.  Psychiatric: She has a normal mood and affect. Her behavior is normal. Judgment and thought content normal.    LABORATORY PANEL:   CBC Recent Labs  Lab 12/14/18 2020  WBC 9.7  HGB 11.8*  HCT 37.8  PLT 942*   ------------------------------------------------------------------------------------------------------------------  Chemistries  Recent Labs  Lab 12/14/18 2020  NA 145  K 3.7  CL 117*  CO2 19*  GLUCOSE 125*  BUN 18  CREATININE 0.71  CALCIUM 7.5*    ------------------------------------------------------------------------------------------------------------------  Cardiac Enzymes Recent Labs  Lab 12/14/18 2020  TROPONINI <0.03   ------------------------------------------------------------------------------------------------------------------  RADIOLOGY:  Dg Chest 1 View  Result Date: 12/14/2018 CLINICAL DATA:  85 y/o  F; fall. EXAM: CHEST  1 VIEW COMPARISON:  01/25/2017 chest radiograph FINDINGS: Stable mildly enlarged cardiac silhouette given projection and technique. Aortic atherosclerosis with calcification. Stable chronic interstitial changes of the lungs. No focal consolidation. No pleural effusion or pneumothorax. No acute osseous abnormality is evident. IMPRESSION: 1. Stable chronic interstitial changes of the lungs. No acute pulmonary process identified. 2.  Aortic Atherosclerosis (ICD10-I70.0).  Stable mild cardiomegaly. Electronically Signed   By: Kristine Garbe M.D.   On: 12/14/2018 22:29   Ct Head Wo Contrast  Result Date: 12/14/2018 CLINICAL DATA:  Unwitnessed fall today. Altered mental status. History of dementia. EXAM:  CT HEAD WITHOUT CONTRAST CT CERVICAL SPINE WITHOUT CONTRAST TECHNIQUE: Multidetector CT imaging of the head and cervical spine was performed following the standard protocol without intravenous contrast. Multiplanar CT image reconstructions of the cervical spine were also generated. COMPARISON:  01/25/2017 FINDINGS: CT HEAD FINDINGS Brain: There is no evidence of acute infarct, intracranial hemorrhage, mass, midline shift, or extra-axial fluid collection. Mild-to-moderate cerebral atrophy is unchanged. Periventricular white matter hypodensities are unchanged and nonspecific but compatible with minimal chronic small vessel ischemic disease, not greater than expected for age. Vascular: Calcified atherosclerosis at the skull base. No hyperdense vessel. Skull: No fracture or focal osseous lesion.  Sinuses/Orbits: Minimal debris in the left sphenoid sinus. Clear mastoid air cells. Unremarkable orbits. Other: None. CT CERVICAL SPINE FINDINGS Alignment: Cervical spine straightening. No listhesis. Skull base and vertebrae: No acute fracture or suspicious osseous lesion. Soft tissues and spinal canal: No prevertebral fluid or swelling. No visible canal hematoma. Disc levels: Unchanged disc degeneration with disc space narrowing at C5-6 greater than C6-7. No evidence of osseous neural foraminal or spinal canal stenosis. Upper chest: Mild centrilobular emphysema in apically lung scarring. Other: None. IMPRESSION: 1. No evidence of acute intracranial abnormality. 2. No acute cervical spine fracture or subluxation. Electronically Signed   By: Logan Bores M.D.   On: 12/14/2018 20:53   Ct Cervical Spine Wo Contrast  Result Date: 12/14/2018 CLINICAL DATA:  Unwitnessed fall today. Altered mental status. History of dementia. EXAM: CT HEAD WITHOUT CONTRAST CT CERVICAL SPINE WITHOUT CONTRAST TECHNIQUE: Multidetector CT imaging of the head and cervical spine was performed following the standard protocol without intravenous contrast. Multiplanar CT image reconstructions of the cervical spine were also generated. COMPARISON:  01/25/2017 FINDINGS: CT HEAD FINDINGS Brain: There is no evidence of acute infarct, intracranial hemorrhage, mass, midline shift, or extra-axial fluid collection. Mild-to-moderate cerebral atrophy is unchanged. Periventricular white matter hypodensities are unchanged and nonspecific but compatible with minimal chronic small vessel ischemic disease, not greater than expected for age. Vascular: Calcified atherosclerosis at the skull base. No hyperdense vessel. Skull: No fracture or focal osseous lesion. Sinuses/Orbits: Minimal debris in the left sphenoid sinus. Clear mastoid air cells. Unremarkable orbits. Other: None. CT CERVICAL SPINE FINDINGS Alignment: Cervical spine straightening. No listhesis.  Skull base and vertebrae: No acute fracture or suspicious osseous lesion. Soft tissues and spinal canal: No prevertebral fluid or swelling. No visible canal hematoma. Disc levels: Unchanged disc degeneration with disc space narrowing at C5-6 greater than C6-7. No evidence of osseous neural foraminal or spinal canal stenosis. Upper chest: Mild centrilobular emphysema in apically lung scarring. Other: None. IMPRESSION: 1. No evidence of acute intracranial abnormality. 2. No acute cervical spine fracture or subluxation. Electronically Signed   By: Logan Bores M.D.   On: 12/14/2018 20:53   Dg Hip Unilat W Or Wo Pelvis 2-3 Views Right  Result Date: 12/14/2018 CLINICAL DATA:  85 y/o  F; unwitnessed fall. EXAM: DG HIP (WITH OR WITHOUT PELVIS) 2-3V RIGHT COMPARISON:  None. FINDINGS: Acute right femoral neck fracture with mild proximal migration of the femoral shaft. No joint dislocation. No pelvic fracture or pelvic diastasis identified. IMPRESSION: Acute right femoral neck fracture with mild proximal migration of the femoral shaft. Electronically Signed   By: Kristine Garbe M.D.   On: 12/14/2018 22:28    EKG:   Orders placed or performed during the hospital encounter of 12/14/18  . EKG 12-Lead  . EKG 12-Lead    IMPRESSION AND PLAN:  Principal Problem:   Closed right  hip fracture ([place]) -seen on x-ray imaging.  Orthopedic surgery consult.  PRN analgesia Active Problems:   Type 2 diabetes mellitus without complication (HCC) -sliding scale insulin coverage   COPD (chronic obstructive pulmonary disease) (HCC) -home dose inhalers   Essential thrombocytosis (HCC) -home dose hydroxyurea   Dementia ([place]) -continue home meds  Chart review performed and case discussed with ED provider. Labs, imaging and/or ECG reviewed by provider and discussed with patient/family. Management plans discussed with the patient and/or family.  DVT PROPHYLAXIS: Mechanical only  GI PROPHYLAXIS:  None  ADMISSION  STATUS: Inpatient     CODE STATUS: Full Code Status History    Date Active Date Inactive Code Status Order ID Comments User Context   12/06/2016 2024 12/07/2016 2028 Full Code [id number]  Fritzi Mandes, MD Inpatient   10/14/2016 1646 10/16/2016 1842 Full Code [id number]  Vaughan Basta, MD Inpatient   03/07/2016 1420 03/09/2016 1735 Full Code [id number]  Lytle Butte, MD ED   02/22/2016 2010 02/24/2016 1954 Full Code [id number]  Hillary Bow, MD ED   01/20/2016 1947 01/21/2016 1640 Full Code [id number]  Gladstone Lighter, MD ED      TOTAL TIME TAKING CARE OF THIS PATIENT: 45 minutes.   Ethlyn Daniels 12/14/2018, 11:37 PM  [facility]  Office  [phone]  CC: Primary care physician; Raelyn Number, MD  Note:  This document was prepared using Dragon voice recognition software and may include unintentional dictation errors.

## 2018-12-14 NOTE — ED Notes (Signed)
Spoke with Leandrew Koyanagi at [facility]. Kyla informed this RN that pt baseline is normally active and that pt walks well. States that pt usually sundowns and stares off into space, but this evening was different because she was drooling and would not stand up. States pt sleeps well at night. Normally can not carry a conversation but will answer some yes or no questions.

## 2018-12-14 NOTE — ED Notes (Signed)
Pt sleeping. 

## 2018-12-14 NOTE — ED Provider Notes (Signed)
[facility] Emergency Department Provider Note   ____________________________________________   I have reviewed the triage vital signs and the nursing notes.   HISTORY  Chief Complaint Fall and Altered Mental Status   History limited by and level 5 caveat due to dementia   HPI Savannah Young is a 85 y.o. female who presents to the emergency department today from living facility because of concerns for some altered mental status and decreased ambulation.  Patient is coming from living facility.  Does have a history of dementia and is unable to give any history here.  Per report however the patient was found to be altered and not willing to walk.    Records reviewed. Per medical record review patient has a history of dementia, DM  Past Medical History:  Diagnosis Date  . COPD (chronic obstructive pulmonary disease) ([place])   . Dementia ([place])   . Diabetes mellitus ([place])   . Hypertension     Patient Active Problem List   Diagnosis Date Noted  . Syncope 12/06/2016  . Iron deficiency anemia due to chronic blood loss 11/27/2016  . Encounter for blood transfusion 10/16/2016  . Abnormal findings on esophagogastroduodenoscopy (EGD) 10/16/2016  . Angiodysplasia of stomach and duodenum with bleeding 10/16/2016  . Essential thrombocytosis ([place]) 10/16/2016  . SVT (supraventricular tachycardia) ([place]) 10/16/2016  . Iron deficiency anemia 10/14/2016  . Thrombocythemia ([place]) 10/14/2016  . TIA (transient ischemic attack) 10/14/2016  . Encephalopathy acute 03/07/2016  . Pressure ulcer 02/23/2016  . Confusion 02/22/2016  . Agitation 01/20/2016  . Overactive bladder 02/06/2015  . Dementia ([place]) 02/06/2014  . Type 2 diabetes mellitus without complication ([place]) 25/85/2778  . Increased frequency of urination 01/07/2013    Past Surgical History:  Procedure Laterality Date  . ESOPHAGOGASTRODUODENOSCOPY (EGD) WITH PROPOFOL N/A 10/16/2016   Procedure:  ESOPHAGOGASTRODUODENOSCOPY (EGD) WITH PROPOFOL;  Surgeon: San Jetty, MD;  Location: [facility] ENDOSCOPY;  Service: Gastroenterology;  Laterality: N/A;    Prior to Admission medications   Medication Sig Start Date End Date Taking? Authorizing Provider  acetaminophen (TYLENOL) 500 MG tablet Take 500 mg by mouth 3 (three) times daily as needed.    [provider]  atorvastatin (LIPITOR) 20 MG tablet Take 1 tablet (20 mg total) by mouth daily. 02/24/16   Theodoro Grist, MD  Cholecalciferol 50000 units TABS Take by mouth.    [provider]  Cranberry 425 MG CAPS Take 425 mg by mouth daily.    [provider]  docusate sodium (COLACE) 100 MG capsule Take 1 capsule (100 mg total) by mouth 2 (two) times daily as needed for mild constipation. 10/16/16   Theodoro Grist, MD  ferrous gluconate (FERGON) 324 MG tablet Take 240 mg by mouth daily with breakfast.    [provider]  fexofenadine (ALLEGRA) 180 MG tablet Take 180 mg by mouth daily.    [provider]  fluticasone (FLONASE) 50 MCG/ACT nasal spray Place 2 sprays into both nostrils daily.    [provider]  hydroxyurea (HYDREA) 500 MG capsule Take 1 capsule (500 mg total) by mouth daily. May take with food to minimize GI side effects. 04/04/17   Lloyd Huger, MD  LORazepam (ATIVAN) 0.5 MG tablet Take 0.5 mg by mouth every 4 (four) hours as needed for anxiety.    [provider]  Melatonin 10 MG TABS Take 10 mg by mouth at bedtime.    [provider]  memantine (NAMENDA XR) 28 MG CP24 24 hr capsule Take  28 mg by mouth at bedtime.    [provider]  metoprolol tartrate (LOPRESSOR) 25 MG tablet Take 1 tablet (25 mg total) by mouth 2 (two) times daily. 10/16/16   Theodoro Grist, MD  mirtazapine (REMERON) 15 MG tablet Take 15 mg by mouth at bedtime.    [provider]  pantoprazole (PROTONIX) 40 MG tablet Take 1 tablet (40 mg total) by mouth daily. 10/16/16    Theodoro Grist, MD  QUEtiapine (SEROQUEL) 25 MG tablet Take 0.5 tablets (12.5 mg total) by mouth daily before supper. 10/16/16   Theodoro Grist, MD    Allergies Patient has no known allergies.  Family History  Problem Relation Age of Onset  . Hypertension Other     Social History Social History   Tobacco Use  . Smoking status: Never Smoker  . Smokeless tobacco: Former Network engineer Use Topics  . Alcohol use: No  . Drug use: No    Review of Systems Unable to obtain reliable ROS secondary to dementia.  ____________________________________________   PHYSICAL EXAM:  VITAL SIGNS: ED Triage Vitals  Enc Vitals Group     BP 12/14/18 1941 (!) 140/92     Pulse --      Resp 12/14/18 1941 16     Temp 12/14/18 1941 98.9 F (37.2 C)     Temp Source 12/14/18 1941 Oral     SpO2 --      Weight 12/14/18 1938 100 lb (45.4 kg)     Height 12/14/18 1938 5\' 5"  (1.651 m)   Constitutional: Awake and alert.  Eyes: Conjunctivae are normal.  ENT      Head: Normocephalic and atraumatic.      Nose: No congestion/rhinnorhea.      Mouth/Throat: Mucous membranes are moist.      Neck: No stridor. Hematological/Lymphatic/Immunilogical: No cervical lymphadenopathy. Cardiovascular: Normal rate, regular rhythm.  No murmurs, rubs, or gallops.  Respiratory: Normal respiratory effort without tachypnea nor retractions. Breath sounds are clear and equal bilaterally. No wheezes/rales/rhonchi. Gastrointestinal: Soft and non tender. No rebound. No guarding.  Genitourinary: Deferred Musculoskeletal: Tender to manipulation of the right hip Neurologic:  Awake and alert. Not verbal in answering questions. Skin:  Skin is warm, dry and intact. No rash noted. ____________________________________________    LABS (pertinent positives/negatives)  Trop <0.03 UA hazy, ketones 20, rbc and wbc 0-5 BMP na 145, k 3.7, glu 125, cr 0.71 CBC wbc 9.7, hgb 11.8, plt  942  ____________________________________________   EKG  I, Nance Pear, attending physician, personally viewed and interpreted this EKG  EKG Time: 1945 Rate: 87 Rhythm: sinus rhythm Axis: normal Intervals: qtc 432 QRS: narrow ST changes: no st elevation Impression: normal ekg   ____________________________________________    RADIOLOGY  CXR No acute abnormality  CT head/cervical spine No acute abnormality  Right hip Right femoral neck fracture  ____________________________________________   PROCEDURES  Procedures  ____________________________________________   INITIAL IMPRESSION / ASSESSMENT AND PLAN / ED COURSE  Pertinent labs & imaging results that were available during my care of the patient were reviewed by me and considered in my medical decision making (see chart for details).   Patient presented to the emergency department today because of concerns for decreased ambulation and altered mental status.  Patient has a history of dementia cannot give any history.  Work-up however did show a right femoral neck fracture.  I discussed with orthopedics as well as hospitalist for admission.   ____________________________________________   FINAL CLINICAL IMPRESSION(S) / ED DIAGNOSES  Final diagnoses:  Closed fracture of right hip, initial encounter [facility])  Fall, initial encounter     Note: This dictation was prepared with Diplomatic Services operational officer dictation. Any transcriptional errors that result from this process are unintentional     Nance Pear, MD 12/14/18 2248

## 2018-12-14 NOTE — ED Notes (Signed)
ED TO INPATIENT HANDOFF REPORT  ED Nurse Name and Phone #: Brendt Dible [phone]   S Name/Age/Gender Savannah Young 85 y.o. female Room/Bed: ED10A/ED10A  Code Status   Code Status: Prior  Home/SNF/Other Nursing Home Patient oriented to: self Is this baseline? pt is usually alert to self, but today is not per staff at [facility]  Triage Complete: Triage complete  Chief Complaint alt mental status  Triage Note PT to ED via EMS from [facility] assisted living. Called out for c/o altered mental status. Facility states that pt will not feed herself. Unwitnessed fall this morning. Pt  Takes 81mg  aspirin. HX of alzheimer's/dementia. Baseline AOx1 per EMS.    Allergies No Known Allergies  Level of Care/Admitting Diagnosis ED Disposition    ED Disposition Condition [facility] Area: [place] [[id number]]  Level of Care: Med-Surg [16]  Covid Evaluation: N/A  Diagnosis: Closed right hip fracture [facility]) [[id number]]  Admitting Physician: Lance Coon [[id number]]  Attending Physician: Lance Coon [phone]  Estimated length of stay: past midnight tomorrow  Certification:: I certify this patient will need inpatient services for at least 2 midnights  PT Class (Do Not Modify): Inpatient [101]  PT Acc Code (Do Not Modify): Private [1]       B Medical/Surgery History Past Medical History:  Diagnosis Date  . COPD (chronic obstructive pulmonary disease) ([place])   . Dementia ([place])   . Diabetes mellitus ([place])   . Hypertension    Past Surgical History:  Procedure Laterality Date  . ESOPHAGOGASTRODUODENOSCOPY (EGD) WITH PROPOFOL N/A 10/16/2016   Procedure: ESOPHAGOGASTRODUODENOSCOPY (EGD) WITH PROPOFOL;  Surgeon: San Jetty, MD;  Location: [facility] ENDOSCOPY;  Service: Gastroenterology;  Laterality: N/A;     A IV Location/Drains/Wounds Patient Lines/Drains/Airways Status   Active Line/Drains/Airways    Name:   Placement date:   Placement time:   Site:   Days:    Peripheral IV 01/25/17 Right Forearm   01/25/17    0335    Forearm   688   Urethral Catheter Naquisha Whitehair, rn Straight-tip 14 Fr.   12/14/18    2330    Straight-tip   less than 1          Intake/Output Last 24 hours No intake or output data in the 24 hours ending 12/14/18 2341  Labs/Imaging Results for orders placed or performed during the hospital encounter of 12/14/18 (from the past 48 hour(s))  CBC with Differential     Status: Abnormal   Collection Time: 12/14/18  8:20 PM  Result Value Ref Range   WBC 9.7 4.0 - 10.5 K/uL   RBC 4.09 3.87 - 5.11 MIL/uL   Hemoglobin 11.8 (L) 12.0 - 15.0 g/dL   HCT 37.8 36.0 - 46.0 %   MCV 92.4 80.0 - 100.0 fL   MCH 28.9 26.0 - 34.0 pg   MCHC 31.2 30.0 - 36.0 g/dL   RDW 16.0 (H) 11.5 - 15.5 %   Platelets 942 (HH) 150 - 400 K/uL    Comment: This critical result has verified and been called to [facility] by Prudy Feeler on 04 17 2020 at 2102, and has been read back.    nRBC 0.0 0.0 - 0.2 %   Neutrophils Relative % 82 %   Neutro Abs 8.0 (H) 1.7 - 7.7 K/uL   Lymphocytes Relative 9 %   Lymphs Abs 0.8 0.7 - 4.0 K/uL   Monocytes Relative 8 %   Monocytes Absolute 0.7 0.1 - 1.0 K/uL  Eosinophils Relative 0 %   Eosinophils Absolute 0.0 0.0 - 0.5 K/uL   Basophils Relative 0 %   Basophils Absolute 0.0 0.0 - 0.1 K/uL   Immature Granulocytes 1 %   Abs Immature Granulocytes 0.05 0.00 - 0.07 K/uL    Comment: Performed at [facility], [address] [place], [place] [address]  Basic metabolic panel     Status: Abnormal   Collection Time: 12/14/18  8:20 PM  Result Value Ref Range   Sodium 145 135 - 145 mmol/L   Potassium 3.7 3.5 - 5.1 mmol/L    Comment: HEMOLYSIS AT THIS LEVEL MAY AFFECT RESULT   Chloride 117 (H) 98 - 111 mmol/L   CO2 19 (L) 22 - 32 mmol/L   Glucose, Bld 125 (H) 70 - 99 mg/dL   BUN 18 8 - 23 mg/dL   Creatinine, Ser 0.71 0.44 - 1.00 mg/dL   Calcium 7.5 (L) 8.9 - 10.3 mg/dL   GFR calc non Af Amer >60 >60 mL/min   GFR  calc Af Amer >60 >60 mL/min   Anion gap 9 5 - 15    Comment: Performed at [facility], [address] [place], [place] [address]  Troponin I -     Status: None   Collection Time: 12/14/18  8:20 PM  Result Value Ref Range   Troponin I <0.03 <0.03 ng/mL    Comment: Performed at [facility], [address] [place], [place] [address]  Urinalysis, Complete w Microscopic     Status: Abnormal   Collection Time: 12/14/18  9:19 PM  Result Value Ref Range   Color, Urine YELLOW (A) YELLOW   APPearance HAZY (A) CLEAR   Specific Gravity, Urine 1.026 1.005 - 1.030   pH 5.0 5.0 - 8.0   Glucose, UA NEGATIVE NEGATIVE mg/dL   Hgb urine dipstick NEGATIVE NEGATIVE   Bilirubin Urine NEGATIVE NEGATIVE   Ketones, ur 20 (A) NEGATIVE mg/dL   Protein, ur NEGATIVE NEGATIVE mg/dL   Nitrite NEGATIVE NEGATIVE   Leukocytes,Ua NEGATIVE NEGATIVE   RBC / HPF 0-5 0 - 5 RBC/hpf   WBC, UA 0-5 0 - 5 WBC/hpf   Bacteria, UA NONE SEEN NONE SEEN   Squamous Epithelial / LPF NONE SEEN 0 - 5   Mucus PRESENT    Hyaline Casts, UA PRESENT     Comment: Performed at [facility], [address] [place], [place] [address]   Dg Chest 1 View  Result Date: 12/14/2018 CLINICAL DATA:  85 y/o  F; fall. EXAM: CHEST  1 VIEW COMPARISON:  01/25/2017 chest radiograph FINDINGS: Stable mildly enlarged cardiac silhouette given projection and technique. Aortic atherosclerosis with calcification. Stable chronic interstitial changes of the lungs. No focal consolidation. No pleural effusion or pneumothorax. No acute osseous abnormality is evident. IMPRESSION: 1. Stable chronic interstitial changes of the lungs. No acute pulmonary process identified. 2.  Aortic Atherosclerosis (ICD10-I70.0).  Stable mild cardiomegaly. Electronically Signed   By: Kristine Garbe M.D.   On: 12/14/2018 22:29   Ct Head Wo Contrast  Result Date: 12/14/2018 CLINICAL DATA:  Unwitnessed fall today. Altered mental status. History  of dementia. EXAM: CT HEAD WITHOUT CONTRAST CT CERVICAL SPINE WITHOUT CONTRAST TECHNIQUE: Multidetector CT imaging of the head and cervical spine was performed following the standard protocol without intravenous contrast. Multiplanar CT image reconstructions of the cervical spine were also generated. COMPARISON:  01/25/2017 FINDINGS: CT HEAD FINDINGS Brain: There is no evidence of acute infarct, intracranial hemorrhage, mass, midline  shift, or extra-axial fluid collection. Mild-to-moderate cerebral atrophy is unchanged. Periventricular white matter hypodensities are unchanged and nonspecific but compatible with minimal chronic small vessel ischemic disease, not greater than expected for age. Vascular: Calcified atherosclerosis at the skull base. No hyperdense vessel. Skull: No fracture or focal osseous lesion. Sinuses/Orbits: Minimal debris in the left sphenoid sinus. Clear mastoid air cells. Unremarkable orbits. Other: None. CT CERVICAL SPINE FINDINGS Alignment: Cervical spine straightening. No listhesis. Skull base and vertebrae: No acute fracture or suspicious osseous lesion. Soft tissues and spinal canal: No prevertebral fluid or swelling. No visible canal hematoma. Disc levels: Unchanged disc degeneration with disc space narrowing at C5-6 greater than C6-7. No evidence of osseous neural foraminal or spinal canal stenosis. Upper chest: Mild centrilobular emphysema in apically lung scarring. Other: None. IMPRESSION: 1. No evidence of acute intracranial abnormality. 2. No acute cervical spine fracture or subluxation. Electronically Signed   By: Logan Bores M.D.   On: 12/14/2018 20:53   Ct Cervical Spine Wo Contrast  Result Date: 12/14/2018 CLINICAL DATA:  Unwitnessed fall today. Altered mental status. History of dementia. EXAM: CT HEAD WITHOUT CONTRAST CT CERVICAL SPINE WITHOUT CONTRAST TECHNIQUE: Multidetector CT imaging of the head and cervical spine was performed following the standard protocol without  intravenous contrast. Multiplanar CT image reconstructions of the cervical spine were also generated. COMPARISON:  01/25/2017 FINDINGS: CT HEAD FINDINGS Brain: There is no evidence of acute infarct, intracranial hemorrhage, mass, midline shift, or extra-axial fluid collection. Mild-to-moderate cerebral atrophy is unchanged. Periventricular white matter hypodensities are unchanged and nonspecific but compatible with minimal chronic small vessel ischemic disease, not greater than expected for age. Vascular: Calcified atherosclerosis at the skull base. No hyperdense vessel. Skull: No fracture or focal osseous lesion. Sinuses/Orbits: Minimal debris in the left sphenoid sinus. Clear mastoid air cells. Unremarkable orbits. Other: None. CT CERVICAL SPINE FINDINGS Alignment: Cervical spine straightening. No listhesis. Skull base and vertebrae: No acute fracture or suspicious osseous lesion. Soft tissues and spinal canal: No prevertebral fluid or swelling. No visible canal hematoma. Disc levels: Unchanged disc degeneration with disc space narrowing at C5-6 greater than C6-7. No evidence of osseous neural foraminal or spinal canal stenosis. Upper chest: Mild centrilobular emphysema in apically lung scarring. Other: None. IMPRESSION: 1. No evidence of acute intracranial abnormality. 2. No acute cervical spine fracture or subluxation. Electronically Signed   By: Logan Bores M.D.   On: 12/14/2018 20:53   Dg Hip Unilat W Or Wo Pelvis 2-3 Views Right  Result Date: 12/14/2018 CLINICAL DATA:  85 y/o  F; unwitnessed fall. EXAM: DG HIP (WITH OR WITHOUT PELVIS) 2-3V RIGHT COMPARISON:  None. FINDINGS: Acute right femoral neck fracture with mild proximal migration of the femoral shaft. No joint dislocation. No pelvic fracture or pelvic diastasis identified. IMPRESSION: Acute right femoral neck fracture with mild proximal migration of the femoral shaft. Electronically Signed   By: Kristine Garbe M.D.   On: 12/14/2018 22:28     Pending Labs Unresulted Labs (From admission, onward)    Start     Ordered   12/14/18 2314  [place] - STAT,   STAT     12/14/18 2313   12/14/18 2314  APTT  ONCE - STAT,   STAT     12/14/18 2313   12/14/18 2020  Pathologist smear review  Once,   STAT     12/14/18 2020   Signed and Held  Basic metabolic panel  Tomorrow morning,   R  Signed and Held   Signed and Held  CBC  Tomorrow morning,   R     Signed and Held          Vitals/Pain Today's Vitals   12/14/18 2130 12/14/18 2230 12/14/18 2300 12/14/18 2330  BP: 118/61 124/70 130/69 (!) 143/76  Pulse: 84 99 88   Resp: (!) 21 (!) 21 (!) 21 (!) 21  Temp:      TempSrc:      SpO2: 96% 96% 97%   Weight:      Height:        Isolation Precautions No active isolations  Medications Medications - No data to display  Mobility walks with person assist High fall risk   Focused Assessments musculoskeletal   R Recommendations: See Admitting Provider Note  Report given to:   Additional Notes:

## 2018-12-14 NOTE — ED Notes (Signed)
MD notified of critical platelet count. NO new orders

## 2018-12-14 NOTE — ED Notes (Signed)
PT cleansed of incontinent urine, 3  Dirty briefs removed PT given warm blanket

## 2018-12-15 ENCOUNTER — Inpatient Hospital Stay: Payer: Medicare Other | Admitting: Anesthesiology

## 2018-12-15 ENCOUNTER — Other Ambulatory Visit: Payer: Self-pay

## 2018-12-15 ENCOUNTER — Encounter
Admission: EM | Disposition: A | Discharge status: Skilled Nursing Facility | Payer: Self-pay | Source: Skilled Nursing Facility | Attending: Specialist

## 2018-12-15 ENCOUNTER — Encounter: Payer: Self-pay | Admitting: Anesthesiology

## 2018-12-15 ENCOUNTER — Inpatient Hospital Stay: Payer: Medicare Other

## 2018-12-15 HISTORY — PX: HIP ARTHROPLASTY: SHX981

## 2018-12-15 LAB — CBC
HCT: 36.7 % (ref 36.0–46.0)
Hemoglobin: 11.7 g/dL — ABNORMAL LOW (ref 12.0–15.0)
MCH: 29.1 pg (ref 26.0–34.0)
MCHC: 31.9 g/dL (ref 30.0–36.0)
MCV: 91.3 fL (ref 80.0–100.0)
Platelets: 1023 10*3/uL (ref 150–400)
RBC: 4.02 MIL/uL (ref 3.87–5.11)
RDW: 15.9 % — ABNORMAL HIGH (ref 11.5–15.5)
WBC: 7.9 10*3/uL (ref 4.0–10.5)
nRBC: 0 % (ref 0.0–0.2)

## 2018-12-15 LAB — GLUCOSE, CAPILLARY: Glucose-Capillary: 121 mg/dL — ABNORMAL HIGH (ref 70–99)

## 2018-12-15 LAB — BASIC METABOLIC PANEL
Anion gap: 9 (ref 5–15)
BUN: 19 mg/dL (ref 8–23)
CO2: 23 mmol/L (ref 22–32)
Calcium: 8.7 mg/dL — ABNORMAL LOW (ref 8.9–10.3)
Chloride: 114 mmol/L — ABNORMAL HIGH (ref 98–111)
Creatinine, Ser: 0.75 mg/dL (ref 0.44–1.00)
GFR calc Af Amer: >60 mL/min (ref >60)
GFR calc non Af Amer: >60 mL/min (ref >60)
Glucose, Bld: 129 mg/dL — ABNORMAL HIGH (ref 70–99)
Potassium: 3.6 mmol/L (ref 3.5–5.1)
Sodium: 146 mmol/L — ABNORMAL HIGH (ref 135–145)

## 2018-12-15 SURGERY — HEMIARTHROPLASTY, HIP, DIRECT ANTERIOR APPROACH, FOR FRACTURE
Anesthesia: General | Site: Hip | Laterality: Right

## 2018-12-15 MED ORDER — ENOXAPARIN SODIUM 30 MG/0.3ML ~~LOC~~ SOLN
30.0000 mg | Freq: Two times a day (BID) | SUBCUTANEOUS | Status: DC
  Administered 2018-12-16 – 2018-12-18 (×5): 30 mg via SUBCUTANEOUS
  Filled 2018-12-15 (×5): qty 0.3

## 2018-12-15 MED ORDER — GENTAMICIN SULFATE 40 MG/ML IJ SOLN
INTRAMUSCULAR | Status: AC
  Filled 2018-12-15: qty 6

## 2018-12-15 MED ORDER — HYDROXYUREA 500 MG PO CAPS
500.0000 mg | ORAL_CAPSULE | Freq: Every day | ORAL | Status: DC
  Administered 2018-12-16 – 2018-12-18 (×3): 500 mg via ORAL
  Filled 2018-12-15 (×4): qty 1

## 2018-12-15 MED ORDER — MEMANTINE HCL ER 28 MG PO CP24
28.0000 mg | ORAL_CAPSULE | Freq: Every day | ORAL | Status: DC
  Administered 2018-12-15 – 2018-12-17 (×3): 28 mg via ORAL
  Filled 2018-12-15 (×4): qty 1

## 2018-12-15 MED ORDER — ACETAMINOPHEN 500 MG PO TABS
500.0000 mg | ORAL_TABLET | Freq: Four times a day (QID) | ORAL | Status: AC
  Administered 2018-12-15 – 2018-12-16 (×4): 500 mg via ORAL
  Filled 2018-12-15 (×4): qty 1

## 2018-12-15 MED ORDER — PROPOFOL 10 MG/ML IV BOLUS
INTRAVENOUS | Status: DC | PRN
  Administered 2018-12-15: 70 mg via INTRAVENOUS

## 2018-12-15 MED ORDER — PHENYLEPHRINE HCL (PRESSORS) 10 MG/ML IV SOLN
INTRAVENOUS | Status: DC | PRN
  Administered 2018-12-15 (×2): 100 ug via INTRAVENOUS

## 2018-12-15 MED ORDER — POLYETHYLENE GLYCOL 3350 17 G PO PACK: 17.0000 g | PACK | Freq: Every day | ORAL | Status: DC | PRN

## 2018-12-15 MED ORDER — POTASSIUM CHLORIDE IN NACL 20-0.9 MEQ/L-% IV SOLN
INTRAVENOUS | Status: DC
  Administered 2018-12-15 – 2018-12-18 (×5): via INTRAVENOUS
  Filled 2018-12-15 (×7): qty 1000

## 2018-12-15 MED ORDER — FERROUS SULFATE 325 (65 FE) MG PO TABS
325.0000 mg | ORAL_TABLET | Freq: Three times a day (TID) | ORAL | Status: DC
  Administered 2018-12-16 – 2018-12-18 (×6): 325 mg via ORAL
  Filled 2018-12-15 (×5): qty 1

## 2018-12-15 MED ORDER — PHENOL 1.4 % MT LIQD
1.0000 | OROMUCOSAL | Status: DC | PRN
  Filled 2018-12-15: qty 177

## 2018-12-15 MED ORDER — TRAMADOL HCL 50 MG PO TABS
50.0000 mg | ORAL_TABLET | Freq: Four times a day (QID) | ORAL | Status: DC
  Administered 2018-12-16 – 2018-12-18 (×6): 50 mg via ORAL
  Filled 2018-12-15 (×8): qty 1

## 2018-12-15 MED ORDER — METOPROLOL TARTRATE 25 MG PO TABS
25.0000 mg | ORAL_TABLET | Freq: Two times a day (BID) | ORAL | Status: DC
  Administered 2018-12-15 – 2018-12-18 (×5): 25 mg via ORAL
  Filled 2018-12-15 (×7): qty 1

## 2018-12-15 MED ORDER — ALUM & MAG HYDROXIDE-SIMETH 200-200-20 MG/5ML PO SUSP: 30.0000 mL | ORAL | Status: DC | PRN

## 2018-12-15 MED ORDER — ATORVASTATIN CALCIUM 20 MG PO TABS
20.0000 mg | ORAL_TABLET | Freq: Every day | ORAL | Status: DC
  Administered 2018-12-17 – 2018-12-18 (×2): 20 mg via ORAL
  Filled 2018-12-15 (×2): qty 1

## 2018-12-15 MED ORDER — DOCUSATE SODIUM 100 MG PO CAPS
100.0000 mg | ORAL_CAPSULE | Freq: Two times a day (BID) | ORAL | Status: DC
  Administered 2018-12-15 – 2018-12-17 (×4): 100 mg via ORAL
  Filled 2018-12-15 (×5): qty 1

## 2018-12-15 MED ORDER — KETOROLAC TROMETHAMINE 15 MG/ML IJ SOLN
7.5000 mg | Freq: Four times a day (QID) | INTRAMUSCULAR | Status: AC
  Administered 2018-12-15 – 2018-12-16 (×3): 7.5 mg via INTRAVENOUS
  Filled 2018-12-15 (×4): qty 1

## 2018-12-15 MED ORDER — OXYCODONE HCL 5 MG PO TABS: 5.0000 mg | ORAL_TABLET | ORAL | Status: DC | PRN

## 2018-12-15 MED ORDER — SODIUM CHLORIDE 0.9 % IV SOLN
INTRAVENOUS | Status: DC
  Administered 2018-12-15: 01:00:00 via INTRAVENOUS

## 2018-12-15 MED ORDER — ONDANSETRON HCL 4 MG PO TABS: 4.0000 mg | ORAL_TABLET | Freq: Four times a day (QID) | ORAL | Status: DC | PRN

## 2018-12-15 MED ORDER — LIDOCAINE HCL (CARDIAC) PF 100 MG/5ML IV SOSY
PREFILLED_SYRINGE | INTRAVENOUS | Status: DC | PRN
  Administered 2018-12-15: 60 mg via INTRAVENOUS

## 2018-12-15 MED ORDER — ONDANSETRON HCL 4 MG/2ML IJ SOLN
INTRAMUSCULAR | Status: DC | PRN
  Administered 2018-12-15: 4 mg via INTRAVENOUS

## 2018-12-15 MED ORDER — ACETAMINOPHEN 10 MG/ML IV SOLN
INTRAVENOUS | Status: DC | PRN
  Administered 2018-12-15: 1000 mg via INTRAVENOUS

## 2018-12-15 MED ORDER — OXYCODONE HCL 5 MG PO TABS: 5.0000 mg | ORAL_TABLET | Freq: Once | ORAL | Status: DC | PRN

## 2018-12-15 MED ORDER — MELATONIN 5 MG PO TABS
10.0000 mg | ORAL_TABLET | Freq: Every day | ORAL | Status: DC
  Administered 2018-12-16 – 2018-12-17 (×2): 10 mg via ORAL
  Filled 2018-12-15 (×5): qty 2

## 2018-12-15 MED ORDER — ACETAMINOPHEN 325 MG PO TABS: 650.0000 mg | ORAL_TABLET | Freq: Four times a day (QID) | ORAL | Status: DC | PRN

## 2018-12-15 MED ORDER — LORAZEPAM 0.5 MG PO TABS: 0.2500 mg | ORAL_TABLET | Freq: Four times a day (QID) | ORAL | Status: DC | PRN

## 2018-12-15 MED ORDER — FENTANYL CITRATE (PF) 100 MCG/2ML IJ SOLN
INTRAMUSCULAR | Status: AC
  Filled 2018-12-15: qty 2

## 2018-12-15 MED ORDER — ONDANSETRON HCL 4 MG/2ML IJ SOLN: 4.0000 mg | Freq: Four times a day (QID) | INTRAMUSCULAR | Status: DC | PRN

## 2018-12-15 MED ORDER — LIDOCAINE HCL (PF) 2 % IJ SOLN
INTRAMUSCULAR | Status: AC
  Filled 2018-12-15: qty 10

## 2018-12-15 MED ORDER — ONDANSETRON HCL 4 MG/2ML IJ SOLN
INTRAMUSCULAR | Status: AC
  Filled 2018-12-15: qty 2

## 2018-12-15 MED ORDER — DEXAMETHASONE SODIUM PHOSPHATE 10 MG/ML IJ SOLN
INTRAMUSCULAR | Status: AC
  Filled 2018-12-15: qty 1

## 2018-12-15 MED ORDER — LORAZEPAM 0.5 MG PO TABS
0.5000 mg | ORAL_TABLET | Freq: Every day | ORAL | Status: DC
  Administered 2018-12-17 – 2018-12-18 (×2): 0.5 mg via ORAL
  Filled 2018-12-15 (×2): qty 1

## 2018-12-15 MED ORDER — SUCCINYLCHOLINE CHLORIDE 20 MG/ML IJ SOLN
INTRAMUSCULAR | Status: AC
  Filled 2018-12-15: qty 1

## 2018-12-15 MED ORDER — BISACODYL 10 MG RE SUPP: 10.0000 mg | Freq: Every day | RECTAL | Status: DC | PRN

## 2018-12-15 MED ORDER — ROCURONIUM BROMIDE 100 MG/10ML IV SOLN
INTRAVENOUS | Status: DC | PRN
  Administered 2018-12-15: 10 mg via INTRAVENOUS
  Administered 2018-12-15: 30 mg via INTRAVENOUS

## 2018-12-15 MED ORDER — HYDROCODONE-ACETAMINOPHEN 7.5-325 MG PO TABS: 1.0000 | ORAL_TABLET | ORAL | Status: DC | PRN

## 2018-12-15 MED ORDER — KETAMINE HCL 50 MG/ML IJ SOLN
INTRAMUSCULAR | Status: AC
  Filled 2018-12-15: qty 10

## 2018-12-15 MED ORDER — ROCURONIUM BROMIDE 50 MG/5ML IV SOLN
INTRAVENOUS | Status: AC
  Filled 2018-12-15: qty 1

## 2018-12-15 MED ORDER — HYDROCODONE-ACETAMINOPHEN 5-325 MG PO TABS: 1.0000 | ORAL_TABLET | ORAL | Status: DC | PRN

## 2018-12-15 MED ORDER — ACETAMINOPHEN 650 MG RE SUPP: 650.0000 mg | Freq: Four times a day (QID) | RECTAL | Status: DC | PRN

## 2018-12-15 MED ORDER — METHOCARBAMOL 1000 MG/10ML IJ SOLN
500.0000 mg | Freq: Four times a day (QID) | INTRAVENOUS | Status: DC | PRN
  Filled 2018-12-15: qty 5

## 2018-12-15 MED ORDER — QUETIAPINE FUMARATE 25 MG PO TABS
12.5000 mg | ORAL_TABLET | Freq: Every day | ORAL | Status: DC
  Administered 2018-12-17: 12.5 mg via ORAL
  Filled 2018-12-15: qty 1

## 2018-12-15 MED ORDER — ASPIRIN 81 MG PO CHEW
81.0000 mg | CHEWABLE_TABLET | Freq: Every day | ORAL | Status: DC
  Administered 2018-12-15 – 2018-12-18 (×4): 81 mg via ORAL
  Filled 2018-12-15 (×4): qty 1

## 2018-12-15 MED ORDER — MORPHINE SULFATE (PF) 2 MG/ML IV SOLN: 0.5000 mg | INTRAVENOUS | Status: DC | PRN

## 2018-12-15 MED ORDER — LORATADINE 10 MG PO TABS
10.0000 mg | ORAL_TABLET | Freq: Every day | ORAL | Status: DC
  Administered 2018-12-16 – 2018-12-18 (×3): 10 mg via ORAL
  Filled 2018-12-15 (×3): qty 1

## 2018-12-15 MED ORDER — METHOCARBAMOL 500 MG PO TABS
500.0000 mg | ORAL_TABLET | Freq: Four times a day (QID) | ORAL | Status: DC | PRN
  Filled 2018-12-15: qty 1

## 2018-12-15 MED ORDER — CEFAZOLIN SODIUM-DEXTROSE 1-4 GM/50ML-% IV SOLN
1.0000 g | Freq: Four times a day (QID) | INTRAVENOUS | Status: AC
  Administered 2018-12-15 – 2018-12-16 (×2): 1 g via INTRAVENOUS
  Filled 2018-12-15 (×2): qty 50

## 2018-12-15 MED ORDER — SODIUM CHLORIDE 0.9 % IV SOLN
INTRAVENOUS | Status: DC | PRN
  Administered 2018-12-15: 1000 mL

## 2018-12-15 MED ORDER — MORPHINE SULFATE (PF) 2 MG/ML IV SOLN: 2.0000 mg | INTRAVENOUS | Status: DC | PRN

## 2018-12-15 MED ORDER — SUGAMMADEX SODIUM 200 MG/2ML IV SOLN
INTRAVENOUS | Status: AC
  Filled 2018-12-15: qty 2

## 2018-12-15 MED ORDER — ACETAMINOPHEN 10 MG/ML IV SOLN
INTRAVENOUS | Status: AC
  Filled 2018-12-15: qty 100

## 2018-12-15 MED ORDER — PROPOFOL 500 MG/50ML IV EMUL
INTRAVENOUS | Status: AC
  Filled 2018-12-15: qty 50

## 2018-12-15 MED ORDER — LACTATED RINGERS IV SOLN
INTRAVENOUS | Status: DC | PRN
  Administered 2018-12-15 (×2): via INTRAVENOUS

## 2018-12-15 MED ORDER — CEFAZOLIN SODIUM-DEXTROSE 2-4 GM/100ML-% IV SOLN
2.0000 g | INTRAVENOUS | Status: AC
  Administered 2018-12-15: 16:00:00 2 g via INTRAVENOUS
  Filled 2018-12-15 (×2): qty 100

## 2018-12-15 MED ORDER — PANTOPRAZOLE SODIUM 40 MG PO TBEC
40.0000 mg | DELAYED_RELEASE_TABLET | Freq: Every day | ORAL | Status: DC
  Administered 2018-12-16 – 2018-12-18 (×3): 40 mg via ORAL
  Filled 2018-12-15 (×3): qty 1

## 2018-12-15 MED ORDER — MAGNESIUM CITRATE PO SOLN
1.0000 | Freq: Once | ORAL | Status: DC | PRN
  Filled 2018-12-15: qty 296

## 2018-12-15 MED ORDER — SUGAMMADEX SODIUM 200 MG/2ML IV SOLN
INTRAVENOUS | Status: DC | PRN
  Administered 2018-12-15: 90 mg via INTRAVENOUS

## 2018-12-15 MED ORDER — ACETAMINOPHEN 325 MG PO TABS
325.0000 mg | ORAL_TABLET | Freq: Four times a day (QID) | ORAL | Status: DC | PRN
  Administered 2018-12-16: 21:00:00 650 mg via ORAL
  Filled 2018-12-15: qty 2

## 2018-12-15 MED ORDER — MENTHOL 3 MG MT LOZG
1.0000 | LOZENGE | OROMUCOSAL | Status: DC | PRN
  Filled 2018-12-15: qty 9

## 2018-12-15 MED ORDER — ENOXAPARIN SODIUM 30 MG/0.3ML ~~LOC~~ SOLN: 30.0000 mg | SUBCUTANEOUS | Status: DC

## 2018-12-15 MED ORDER — OXYCODONE HCL 5 MG/5ML PO SOLN: 5.0000 mg | Freq: Once | ORAL | Status: DC | PRN

## 2018-12-15 MED ORDER — FENTANYL CITRATE (PF) 100 MCG/2ML IJ SOLN
INTRAMUSCULAR | Status: DC | PRN
  Administered 2018-12-15: 50 ug via INTRAVENOUS

## 2018-12-15 MED ORDER — SUCCINYLCHOLINE CHLORIDE 20 MG/ML IJ SOLN
INTRAMUSCULAR | Status: DC | PRN
  Administered 2018-12-15: 60 mg via INTRAVENOUS

## 2018-12-15 MED ORDER — MIRTAZAPINE 15 MG PO TABS
7.5000 mg | ORAL_TABLET | Freq: Every day | ORAL | Status: DC
  Administered 2018-12-15 – 2018-12-17 (×3): 7.5 mg via ORAL
  Filled 2018-12-15 (×3): qty 1

## 2018-12-15 MED ORDER — DEXAMETHASONE SODIUM PHOSPHATE 10 MG/ML IJ SOLN
INTRAMUSCULAR | Status: DC | PRN
  Administered 2018-12-15: 5 mg via INTRAVENOUS

## 2018-12-15 MED ORDER — FENTANYL CITRATE (PF) 100 MCG/2ML IJ SOLN: 25.0000 ug | INTRAMUSCULAR | Status: DC | PRN

## 2018-12-15 MED ORDER — TRAZODONE HCL 50 MG PO TABS: 25.0000 mg | ORAL_TABLET | ORAL | Status: DC | PRN

## 2018-12-15 SURGICAL SUPPLY — 53 items
BLADE SAGITTAL WIDE XTHICK NO (BLADE) ×3 IMPLANT
BLADE SURG SZ10 CARB STEEL (BLADE) ×3 IMPLANT
BNDG COHESIVE 4X5 TAN STRL (GAUZE/BANDAGES/DRESSINGS) ×3 IMPLANT
CANISTER SUCT 1200ML W/VALVE (MISCELLANEOUS) ×3 IMPLANT
CANISTER SUCT 3000ML PPV (MISCELLANEOUS) ×6 IMPLANT
COVER WAND RF STERILE (DRAPES) ×3 IMPLANT
DRAPE IMP U-DRAPE 54X76 (DRAPES) ×3 IMPLANT
DRAPE INCISE IOBAN 66X60 STRL (DRAPES) ×3 IMPLANT
DRAPE SHEET LG 3/4 BI-LAMINATE (DRAPES) ×6 IMPLANT
DRAPE SURG 17X11 SM STRL (DRAPES) ×3 IMPLANT
DRAPE TABLE BACK 80X90 (DRAPES) ×3 IMPLANT
DRSG OPSITE POSTOP 4X10 (GAUZE/BANDAGES/DRESSINGS) ×3 IMPLANT
DURAPREP 26ML APPLICATOR (WOUND CARE) ×12 IMPLANT
ELECT BLADE 6.5 EXT (BLADE) ×3 IMPLANT
ELECT CAUTERY BLADE 6.4 (BLADE) ×3 IMPLANT
ELECT REM PT RETURN 9FT ADLT (ELECTROSURGICAL) ×3
ELECTRODE REM PT RTRN 9FT ADLT (ELECTROSURGICAL) ×1 IMPLANT
GAUZE SPONGE 4X4 12PLY STRL (GAUZE/BANDAGES/DRESSINGS) ×3 IMPLANT
GAUZE XEROFORM 1X8 LF (GAUZE/BANDAGES/DRESSINGS) ×6 IMPLANT
GLOVE BIOGEL PI IND STRL 9 (GLOVE) ×1 IMPLANT
GLOVE BIOGEL PI INDICATOR 9 (GLOVE) ×2
GLOVE SURG 9.0 ORTHO LTXF (GLOVE) ×6 IMPLANT
GOWN STRL REUS TWL 2XL XL LVL4 (GOWN DISPOSABLE) ×3 IMPLANT
GOWN STRL REUS W/ TWL LRG LVL3 (GOWN DISPOSABLE) ×1 IMPLANT
GOWN STRL REUS W/TWL LRG LVL3 (GOWN DISPOSABLE) ×2
HEAD MODULAR ENDO (Orthopedic Implant) ×2 IMPLANT
HEAD UNPLR 41XMDLR STRL HIP (Orthopedic Implant) ×1 IMPLANT
HEMOVAC 400ML (MISCELLANEOUS) ×3
KIT DRAIN HEMOVAC JP 7FR 400ML (MISCELLANEOUS) ×1 IMPLANT
KIT TURNOVER KIT A (KITS) ×3 IMPLANT
NDL SAFETY ECLIPSE 18X1.5 (NEEDLE) ×1 IMPLANT
NEEDLE FILTER BLUNT 18X 1/2SAF (NEEDLE) ×2
NEEDLE FILTER BLUNT 18X1 1/2 (NEEDLE) ×1 IMPLANT
NEEDLE HYPO 18GX1.5 SHARP (NEEDLE) ×2
NEEDLE MAYO CATGUT SZ4 (NEEDLE) ×3 IMPLANT
NS IRRIG 1000ML POUR BTL (IV SOLUTION) ×3 IMPLANT
PACK HIP PROSTHESIS (MISCELLANEOUS) ×3 IMPLANT
PULSAVAC PLUS IRRIG FAN TIP (DISPOSABLE) ×3
RETRIEVER SUT HEWSON (MISCELLANEOUS) ×3 IMPLANT
SLEEVE UNITRAX V40 (Orthopedic Implant) ×2 IMPLANT
SLEEVE UNITRAX V40 +8 (Orthopedic Implant) ×1 IMPLANT
SOL .9 NS 3000ML IRR  AL (IV SOLUTION) ×2
SOL .9 NS 3000ML IRR UROMATIC (IV SOLUTION) ×1 IMPLANT
STAPLER SKIN PROX 35W (STAPLE) ×3 IMPLANT
STEM FEM ACCOLADE S2 37X99X30 (Stem) ×3 IMPLANT
SUT TICRON 2-0 30IN 311381 (SUTURE) ×12 IMPLANT
SUT VIC AB 0 CT1 36 (SUTURE) ×3 IMPLANT
SUT VIC AB 2-0 CT2 27 (SUTURE) ×6 IMPLANT
SYR 10ML LL (SYRINGE) ×3 IMPLANT
TAPE MICROFOAM 4IN (TAPE) ×3 IMPLANT
TAPE TRANSPORE STRL 2 31045 (GAUZE/BANDAGES/DRESSINGS) ×3 IMPLANT
TIP BRUSH PULSAVAC PLUS 24.33 (MISCELLANEOUS) ×3 IMPLANT
TIP FAN IRRIG PULSAVAC PLUS (DISPOSABLE) ×1 IMPLANT

## 2018-12-15 NOTE — Anesthesia Preprocedure Evaluation (Addendum)
Anesthesia Evaluation  Patient identified by MRN, date of birth, ID band Patient confused    Reviewed: Allergy & Precautions, H&P , NPO status , Patient's Chart, lab work & pertinent test results  History of Anesthesia Complications Negative for: history of anesthetic complications  Airway Mallampati: III  TM Distance: >3 FB Neck ROM: limited    Dental  (+) Chipped, Poor Dentition, Missing   Pulmonary neg pulmonary ROS, neg shortness of breath, COPD,           Cardiovascular Exercise Tolerance: Good hypertension,      Neuro/Psych PSYCHIATRIC DISORDERS Dementia TIA   GI/Hepatic negative GI ROS, Neg liver ROS,   Endo/Other  diabetes, Type 2  Renal/GU      Musculoskeletal   Abdominal   Peds  Hematology  (+) Blood dyscrasia, ,   Anesthesia Other Findings Past Medical History: No date: COPD (chronic obstructive pulmonary disease) ([facility]) No date: Dementia ([place]) No date: Diabetes mellitus ([place]) No date: Hypertension  Past Surgical History: 10/16/2016: ESOPHAGOGASTRODUODENOSCOPY (EGD) WITH PROPOFOL; N/A     Comment:  Procedure: ESOPHAGOGASTRODUODENOSCOPY (EGD) WITH               PROPOFOL;  Surgeon: San Jetty, MD;  Location: [facility]               ENDOSCOPY;  Service: Gastroenterology;  Laterality: N/A;  BMI    Body Mass Index:  19.55 kg/m      Reproductive/Obstetrics negative OB ROS                          Anesthesia Physical Anesthesia Plan  ASA: III  Anesthesia Plan: Spinal   Post-op Pain Management:    Induction: Intravenous  PONV Risk Score and Plan: Ondansetron, Dexamethasone, Midazolam and Treatment may vary due to age or medical condition  Airway Management Planned: Oral ETT  Additional Equipment:   Intra-op Plan:   Post-operative Plan: Extubation in OR  Informed Consent: I have reviewed the patients History and Physical, chart, labs and discussed the procedure  including the risks, benefits and alternatives for the proposed anesthesia with the patient or authorized representative who has indicated his/her understanding and acceptance.     Dental Advisory Given  Plan Discussed with: Anesthesiologist, CRNA and Surgeon  Anesthesia Plan Comments: (Patient has essential thrombocytosis with platelet count greater than 1,000K so plan for GA  Phone consent from patients brother and his daughter, Wells Guiles, over the phone at [phone]  They report no bleeding problems and no anticoagulant use in patient that they are aware of.  They were consented for risks of anesthesia including but not limited to:  - adverse reactions to medications - damage to teeth, lips or other oral mucosa - sore throat or hoarseness - Damage to heart, brain, lungs or loss of life  They voiced understanding.)    Anesthesia Quick Evaluation

## 2018-12-15 NOTE — Anesthesia Procedure Notes (Signed)
Procedure Name: Intubation Date/Time: 12/15/2018 3:45 PM Performed by: Dionne Bucy, CRNA Pre-anesthesia Checklist: Patient identified, Patient being monitored, Timeout performed, Emergency Drugs available and Suction available Patient Re-evaluated:Patient Re-evaluated prior to induction Oxygen Delivery Method: Circle system utilized Preoxygenation: Pre-oxygenation with 100% oxygen Induction Type: IV induction and Rapid sequence Laryngoscope Size: 3 and McGraph Grade View: Grade I Tube type: Oral Tube size: 7.0 mm Number of attempts: 1 Airway Equipment and Method: Stylet and Video-laryngoscopy Placement Confirmation: ETT inserted through vocal cords under direct vision,  positive ETCO2 and breath sounds checked- equal and bilateral Secured at: 20 cm Tube secured with: Tape Dental Injury: Teeth and Oropharynx as per pre-operative assessment

## 2018-12-15 NOTE — Progress Notes (Signed)
ANTICOAGULATION CONSULT NOTE - Initial Consult  Pharmacy Consult for Lovenox  Indication: VTE prophylaxis  No Known Allergies  Patient Measurements: Height: 5' (152.4 cm) Weight: 100 lb 1.4 oz (45.4 kg) IBW/kg (Calculated) : 45.5 Heparin Dosing Weight:   Vital Signs: Temp: 99.1 F (37.3 C) (04/18 1953) Temp Source: Axillary (04/18 1953) BP: 128/72 (04/18 1953) Pulse Rate: 80 (04/18 1953)  Labs: Recent Labs    12/14/18 2020 12/14/18 2326 12/15/18 0439  HGB 11.8*  --  11.7*  HCT 37.8  --  36.7  PLT 942*  --  1,023*  APTT  --  29  --   LABPROT  --  15.2  --   INR  --  1.2  --   CREATININE 0.71  --  0.75  TROPONINI <0.03  --   --     Estimated Creatinine Clearance: 36.8 mL/min (by C-G formula based on SCr of 0.75 mg/dL).   Medical History: Past Medical History:  Diagnosis Date  . COPD (chronic obstructive pulmonary disease) ([place])   . Dementia ([place])   . Diabetes mellitus ([place])   . Hypertension     Medications:  Medications Prior to Admission  Medication Sig Dispense Refill Last Dose  . aspirin 81 MG chewable tablet Chew 81 mg by mouth daily.   12/14/2018 at Unknown time  . atorvastatin (LIPITOR) 20 MG tablet Take 1 tablet (20 mg total) by mouth daily. 30 tablet 0 12/14/2018 at Unknown time  . docusate sodium (COLACE) 100 MG capsule Take 1 capsule (100 mg total) by mouth 2 (two) times daily as needed for mild constipation. (Patient taking differently: Take 100 mg by mouth 2 (two) times daily. ) 10 capsule 0 12/14/2018 at Unknown time  . ferrous gluconate (FERGON) 240 (27 FE) MG tablet Take 240 mg by mouth daily with breakfast.    12/14/2018 at Unknown time  . fluticasone (FLONASE) 50 MCG/ACT nasal spray Place 2 sprays into both nostrils daily.   12/14/2018 at Unknown time  . hydroxyurea (HYDREA) 500 MG capsule Take 1 capsule (500 mg total) by mouth daily. May take with food to minimize GI side effects. 30 capsule 3 12/14/2018 at Unknown time  . loratadine (CLARITIN) 10 MG  tablet Take 10 mg by mouth daily.   12/14/2018 at Unknown time  . LORazepam (ATIVAN) 0.5 MG tablet Take 0.5 mg by mouth daily at 3 pm.    12/14/2018 at Unknown time  . LORazepam (ATIVAN) 0.5 MG tablet Take 0.25 mg by mouth every 6 (six) hours as needed for anxiety.   prn at prn  . Melatonin 3 MG TABS Take 3 mg by mouth at bedtime.    Past Week at Unknown time  . memantine (NAMENDA XR) 28 MG CP24 24 hr capsule Take 28 mg by mouth at bedtime.   Past Week at Unknown time  . metoprolol tartrate (LOPRESSOR) 25 MG tablet Take 1 tablet (25 mg total) by mouth 2 (two) times daily. 60 tablet 6 12/14/2018 at 1745  . mirtazapine (REMERON) 15 MG tablet Take 7.5 mg by mouth at bedtime.    Past Week at Unknown time  . pantoprazole (PROTONIX) 40 MG tablet Take 1 tablet (40 mg total) by mouth daily. 30 tablet 6 12/14/2018 at Unknown time  . QUEtiapine (SEROQUEL) 25 MG tablet Take 0.5 tablets (12.5 mg total) by mouth daily before supper. (Patient taking differently: Take 12.5 mg by mouth daily. ) 15 tablet 6 12/14/2018 at Unknown time  . QUEtiapine (SEROQUEL) 25 MG  tablet Take 25 mg by mouth at bedtime.   Past Week at Unknown time  . traZODone (DESYREL) 50 MG tablet Take 25 mg by mouth every 4 (four) hours as needed (anxiety).    prn at prn    Assessment: Pharmacy consulted to dose lovenox for VTE prophylaxis of s/p surgical repair of hip fracture.  Goal of Therapy:  Prevention of venous thromboembolism   Plan:  Lovenox 30 mg SQ Q24H to start 4/19 @ 0800 originally ordered.  Will adjust dose to lovenox 30 mg SQ Q12H to start 4/19 @ 0800.   Savannah Young 12/15/2018,7:55 PM

## 2018-12-15 NOTE — Progress Notes (Signed)
Pt admitted to Ortho Unit for fall with injury at nursing facility. Pt has history of dementia. Per facility staff pt will get in and out of bed, confused but able to fee herself normally.  Pt can get very agitated at times.  pts brother is guardian of pt but he too has dementia and brother stated to [facility] facility that "his daughter was going to take care of things" but facility has not her from daughter or brother according to Clancy Gourd, Architectural technologist.  Pt at this time is a FULL Code. No allergies reported from facility [facility]).  [place], rn 12/15/2018

## 2018-12-15 NOTE — Anesthesia Post-op Follow-up Note (Signed)
Anesthesia QCDR form completed.        

## 2018-12-15 NOTE — Anesthesia Postprocedure Evaluation (Signed)
Anesthesia Post Note  Patient: Savannah Young  Procedure(s) Performed: ARTHROPLASTY BIPOLAR HIP (HEMIARTHROPLASTY) (Right Hip)  Patient location during evaluation: PACU Anesthesia Type: General Level of consciousness: awake and confused Pain management: pain level controlled Vital Signs Assessment: post-procedure vital signs reviewed and stable Respiratory status: spontaneous breathing, nonlabored ventilation, respiratory function stable and patient connected to nasal cannula oxygen Cardiovascular status: blood pressure returned to baseline and stable Postop Assessment: no apparent nausea or vomiting Anesthetic complications: no     Last Vitals:  Vitals:   12/15/18 1830 12/15/18 1859  BP: (!) 142/92 (P) 136/74  Pulse: 79 (P) 83  Resp: (!) 21   Temp:  (P) 37.4 C  SpO2: 99% (P) 96%    Last Pain:  Vitals:   12/15/18 0749  TempSrc: Oral                 Precious Haws Camil Hausmann

## 2018-12-15 NOTE — Progress Notes (Signed)
Obtained surgical consent from Niece. Patient's brother is the guardian but has dementia. MD updated.

## 2018-12-15 NOTE — Transfer of Care (Signed)
Immediate Anesthesia Transfer of Care Note  Patient: Savannah Young  Procedure(s) Performed: ARTHROPLASTY BIPOLAR HIP (HEMIARTHROPLASTY) (Right Hip)  Patient Location: PACU  Anesthesia Type:General  Level of Consciousness: sedated  Airway & Oxygen Therapy: Patient Spontanous Breathing and Patient connected to face mask oxygen  Post-op Assessment: Report given to RN and Post -op Vital signs reviewed and stable  Post vital signs: Reviewed and stable  Last Vitals:  Vitals Value Taken Time  BP 135/87 12/15/2018  5:35 PM  Temp    Pulse 77 12/15/2018  5:37 PM  Resp 20 12/15/2018  5:37 PM  SpO2 99 % 12/15/2018  5:37 PM  Vitals shown include unvalidated device data.  Last Pain:  Vitals:   12/15/18 0749  TempSrc: Oral         Complications: No apparent anesthesia complications

## 2018-12-15 NOTE — Progress Notes (Signed)
[facility] [place] NAME: Savannah Young    MR#:  [id number]  DATE OF BIRTH:  23-Sep-1931  SUBJECTIVE:   Pt. Here to mechanical fall and noted to have a right hip fracture.  Patient has baseline dementia and therefore poor historian.  No other acute events overnight.  REVIEW OF SYSTEMS:    Review of Systems  Unable to perform ROS: Dementia    Nutrition: NPO for surgery  Tolerating Diet: No Tolerating PT: Await post-op eval.    DRUG ALLERGIES:  No Known Allergies  VITALS:  Blood pressure 132/83, pulse 96, temperature 99 F (37.2 C), temperature source Oral, resp. rate 16, height 5' (1.524 m), weight 45.4 kg, last menstrual period 01/20/2016, SpO2 97 %.  PHYSICAL EXAMINATION:   Physical Exam  GENERAL:  85 y.o.-year-old thin demented patient lying in bed in no acute distress.  EYES: Pupils equal, round, reactive to light and accommodation. No scleral icterus. Extraocular muscles intact.  HEENT: Head atraumatic, normocephalic. Oropharynx and nasopharynx clear.  NECK:  Supple, no jugular venous distention. No thyroid enlargement, no tenderness.  LUNGS: Normal breath sounds bilaterally, no wheezing, rales, rhonchi. No use of accessory muscles of respiration.  CARDIOVASCULAR: S1, S2 normal. No murmurs, rubs, or gallops.  ABDOMEN: Soft, nontender, nondistended. Bowel sounds present. No organomegaly or mass.  EXTREMITIES: No cyanosis, clubbing or edema b/l. Right Lower extremity is externally rotated and shortened due to the fracture. NEUROLOGIC: Cranial nerves II through XII are intact. No focal Motor or sensory deficits b/l.  Globally weka.  PSYCHIATRIC: The patient is alert and oriented x 1.  SKIN: No obvious rash, lesion, or ulcer.    LABORATORY PANEL:   CBC Recent Labs  Lab 12/15/18 0439  WBC 7.9  HGB 11.7*  HCT 36.7  PLT 1,023*    ------------------------------------------------------------------------------------------------------------------  Chemistries  Recent Labs  Lab 12/15/18 0439  NA 146*  K 3.6  CL 114*  CO2 23  GLUCOSE 129*  BUN 19  CREATININE 0.75  CALCIUM 8.7*   ------------------------------------------------------------------------------------------------------------------  Cardiac Enzymes Recent Labs  Lab 12/14/18 2020  TROPONINI <0.03   ------------------------------------------------------------------------------------------------------------------  RADIOLOGY:  Dg Chest 1 View  Result Date: 12/14/2018 CLINICAL DATA:  85 y/o  F; fall. EXAM: CHEST  1 VIEW COMPARISON:  01/25/2017 chest radiograph FINDINGS: Stable mildly enlarged cardiac silhouette given projection and technique. Aortic atherosclerosis with calcification. Stable chronic interstitial changes of the lungs. No focal consolidation. No pleural effusion or pneumothorax. No acute osseous abnormality is evident. IMPRESSION: 1. Stable chronic interstitial changes of the lungs. No acute pulmonary process identified. 2.  Aortic Atherosclerosis (ICD10-I70.0).  Stable mild cardiomegaly. Electronically Signed   By: Kristine Garbe M.D.   On: 12/14/2018 22:29   Ct Head Wo Contrast  Result Date: 12/14/2018 CLINICAL DATA:  Unwitnessed fall today. Altered mental status. History of dementia. EXAM: CT HEAD WITHOUT CONTRAST CT CERVICAL SPINE WITHOUT CONTRAST TECHNIQUE: Multidetector CT imaging of the head and cervical spine was performed following the standard protocol without intravenous contrast. Multiplanar CT image reconstructions of the cervical spine were also generated. COMPARISON:  01/25/2017 FINDINGS: CT HEAD FINDINGS Brain: There is no evidence of acute infarct, intracranial hemorrhage, mass, midline shift, or extra-axial fluid collection. Mild-to-moderate cerebral atrophy is unchanged. Periventricular white matter hypodensities  are unchanged and nonspecific but compatible with minimal chronic small vessel ischemic disease, not greater than expected for age. Vascular: Calcified atherosclerosis at the skull base. No hyperdense vessel. Skull: No fracture or  focal osseous lesion. Sinuses/Orbits: Minimal debris in the left sphenoid sinus. Clear mastoid air cells. Unremarkable orbits. Other: None. CT CERVICAL SPINE FINDINGS Alignment: Cervical spine straightening. No listhesis. Skull base and vertebrae: No acute fracture or suspicious osseous lesion. Soft tissues and spinal canal: No prevertebral fluid or swelling. No visible canal hematoma. Disc levels: Unchanged disc degeneration with disc space narrowing at C5-6 greater than C6-7. No evidence of osseous neural foraminal or spinal canal stenosis. Upper chest: Mild centrilobular emphysema in apically lung scarring. Other: None. IMPRESSION: 1. No evidence of acute intracranial abnormality. 2. No acute cervical spine fracture or subluxation. Electronically Signed   By: Logan Bores M.D.   On: 12/14/2018 20:53   Ct Cervical Spine Wo Contrast  Result Date: 12/14/2018 CLINICAL DATA:  Unwitnessed fall today. Altered mental status. History of dementia. EXAM: CT HEAD WITHOUT CONTRAST CT CERVICAL SPINE WITHOUT CONTRAST TECHNIQUE: Multidetector CT imaging of the head and cervical spine was performed following the standard protocol without intravenous contrast. Multiplanar CT image reconstructions of the cervical spine were also generated. COMPARISON:  01/25/2017 FINDINGS: CT HEAD FINDINGS Brain: There is no evidence of acute infarct, intracranial hemorrhage, mass, midline shift, or extra-axial fluid collection. Mild-to-moderate cerebral atrophy is unchanged. Periventricular white matter hypodensities are unchanged and nonspecific but compatible with minimal chronic small vessel ischemic disease, not greater than expected for age. Vascular: Calcified atherosclerosis at the skull base. No hyperdense  vessel. Skull: No fracture or focal osseous lesion. Sinuses/Orbits: Minimal debris in the left sphenoid sinus. Clear mastoid air cells. Unremarkable orbits. Other: None. CT CERVICAL SPINE FINDINGS Alignment: Cervical spine straightening. No listhesis. Skull base and vertebrae: No acute fracture or suspicious osseous lesion. Soft tissues and spinal canal: No prevertebral fluid or swelling. No visible canal hematoma. Disc levels: Unchanged disc degeneration with disc space narrowing at C5-6 greater than C6-7. No evidence of osseous neural foraminal or spinal canal stenosis. Upper chest: Mild centrilobular emphysema in apically lung scarring. Other: None. IMPRESSION: 1. No evidence of acute intracranial abnormality. 2. No acute cervical spine fracture or subluxation. Electronically Signed   By: Logan Bores M.D.   On: 12/14/2018 20:53   Dg Hip Unilat W Or Wo Pelvis 2-3 Views Right  Result Date: 12/14/2018 CLINICAL DATA:  85 y/o  F; unwitnessed fall. EXAM: DG HIP (WITH OR WITHOUT PELVIS) 2-3V RIGHT COMPARISON:  None. FINDINGS: Acute right femoral neck fracture with mild proximal migration of the femoral shaft. No joint dislocation. No pelvic fracture or pelvic diastasis identified. IMPRESSION: Acute right femoral neck fracture with mild proximal migration of the femoral shaft. Electronically Signed   By: Kristine Garbe M.D.   On: 12/14/2018 22:28     ASSESSMENT AND PLAN:   85 year old female with past medical history of essential thrombocytosis, dementia, hypertension, COPD, diabetes who presented to the hospital after a mechanical fall and noted to have a right hip fracture.  1.  Preoperative cardiovascular examination- patient is a low to moderate risk for noncardiac surgery. -No absolute contraindication to surgery at this time.  Preoperative EKG has been reviewed shows no acute ST or T wave changes. -Continue perioperative beta-blocker.  2.  Status post fall and right hip fracture-  orthopedics has been consulted. - Plan for surgery later this afternoon.  Patient is stable from the medical perspective.  3.  Essential thrombocytosis-patient's platelet count is elevated. -Continue hydroxyurea.  4.  Essential hypertension-continue metoprolol.  5.  GERD-continue Protonix.  6. Dementia-continue Namenda, PRN Ativan and Seroquel.  Patient high  risk for sundowning.  All the records are reviewed and case discussed with Care Management/Social Worker. Management plans discussed with the patient, family and they are in agreement.  CODE STATUS: Full code  DVT Prophylaxis: Lovenox Post operatively.   TOTAL TIME TAKING CARE OF THIS PATIENT: 30 minutes.   POSSIBLE D/C IN 2-3 DAYS, DEPENDING ON CLINICAL CONDITION.   Henreitta Leber M.D on 12/15/2018 at 10:17 AM  Between 7am to 6pm - Pager - [phone]  After 6pm go to [email or web address] - Proofreader  [facility]  Office  [phone]  CC: Primary care physician; Raelyn Number, MD

## 2018-12-15 NOTE — Consult Note (Signed)
ORTHOPAEDIC CONSULTATION  REQUESTING PHYSICIAN: Henreitta Leber, MD  Chief Complaint: Right hip pain  HPI: Savannah Young is a 85 y.o. female with advanced dementia who sustained a fall at her nursing facility, injuring her right hip.  Upon presentation to the [facility] emergency department, the patient was diagnosed with a right, displaced femoral neck hip fracture by x-ray.  Patient was admitted to the hospitalist service for preop evaluation.  Orthopedics is consulted to manage the hip fracture.  Patient is unable to provide a history due to her dementia.  Past Medical History:  Diagnosis Date  . COPD (chronic obstructive pulmonary disease) ([place])   . Dementia ([place])   . Diabetes mellitus ([place])   . Hypertension    Past Surgical History:  Procedure Laterality Date  . ESOPHAGOGASTRODUODENOSCOPY (EGD) WITH PROPOFOL N/A 10/16/2016   Procedure: ESOPHAGOGASTRODUODENOSCOPY (EGD) WITH PROPOFOL;  Surgeon: San Jetty, MD;  Location: [facility] ENDOSCOPY;  Service: Gastroenterology;  Laterality: N/A;   Social History   Socioeconomic History  . Marital status: Widowed    Spouse name: Not on file  . Number of children: Not on file  . Years of education: Not on file  . Highest education level: Not on file  Occupational History  . Not on file  Social Needs  . Financial resource strain: Not on file  . Food insecurity:    Worry: Not on file    Inability: Not on file  . Transportation needs:    Medical: Not on file    Non-medical: Not on file  Tobacco Use  . Smoking status: Never Smoker  . Smokeless tobacco: Former Network engineer and Sexual Activity  . Alcohol use: No  . Drug use: No  . Sexual activity: Never  Lifestyle  . Physical activity:    Days per week: Not on file    Minutes per session: Not on file  . Stress: Not on file  Relationships  . Social connections:    Talks on phone: Not on file    Gets together: Not on file    Attends religious service: Not on file     Active member of club or organization: Not on file    Attends meetings of clubs or organizations: Not on file    Relationship status: Not on file  Other Topics Concern  . Not on file  Social History Narrative   From [facility]   Family History  Problem Relation Age of Onset  . Hypertension Other    No Known Allergies Prior to Admission medications   Medication Sig Start Date End Date Taking? Authorizing Provider  aspirin 81 MG chewable tablet Chew 81 mg by mouth daily.   Yes [provider]  atorvastatin (LIPITOR) 20 MG tablet Take 1 tablet (20 mg total) by mouth daily. 02/24/16  Yes Theodoro Grist, MD  docusate sodium (COLACE) 100 MG capsule Take 1 capsule (100 mg total) by mouth 2 (two) times daily as needed for mild constipation. Patient taking differently: Take 100 mg by mouth 2 (two) times daily.  10/16/16  Yes Theodoro Grist, MD  ferrous gluconate (FERGON) 240 (27 FE) MG tablet Take 240 mg by mouth daily with breakfast.    Yes [provider]  fluticasone (FLONASE) 50 MCG/ACT nasal spray Place 2 sprays into both nostrils daily.   Yes [provider]  hydroxyurea (HYDREA) 500 MG capsule Take 1 capsule (500 mg total) by mouth daily. May take with food to minimize GI side  effects. 04/04/17  Yes Lloyd Huger, MD  loratadine (CLARITIN) 10 MG tablet Take 10 mg by mouth daily.   Yes [provider]  LORazepam (ATIVAN) 0.5 MG tablet Take 0.5 mg by mouth daily at 3 pm.    Yes [provider]  LORazepam (ATIVAN) 0.5 MG tablet Take 0.25 mg by mouth every 6 (six) hours as needed for anxiety.   Yes [provider]  Melatonin 3 MG TABS Take 3 mg by mouth at bedtime.    Yes [provider]  memantine (NAMENDA XR) 28 MG CP24 24 hr capsule Take 28 mg by mouth at bedtime.   Yes [provider]  metoprolol tartrate (LOPRESSOR) 25 MG tablet Take 1 tablet (25 mg total) by mouth 2 (two) times daily.  10/16/16  Yes Theodoro Grist, MD  mirtazapine (REMERON) 15 MG tablet Take 7.5 mg by mouth at bedtime.    Yes [provider]  pantoprazole (PROTONIX) 40 MG tablet Take 1 tablet (40 mg total) by mouth daily. 10/16/16  Yes Theodoro Grist, MD  QUEtiapine (SEROQUEL) 25 MG tablet Take 0.5 tablets (12.5 mg total) by mouth daily before supper. Patient taking differently: Take 12.5 mg by mouth daily.  10/16/16  Yes Theodoro Grist, MD  QUEtiapine (SEROQUEL) 25 MG tablet Take 25 mg by mouth at bedtime.   Yes [provider]  traZODone (DESYREL) 50 MG tablet Take 25 mg by mouth every 4 (four) hours as needed (anxiety).    Yes [provider]   Dg Chest 1 View  Result Date: 12/14/2018 CLINICAL DATA:  85 y/o  F; fall. EXAM: CHEST  1 VIEW COMPARISON:  01/25/2017 chest radiograph FINDINGS: Stable mildly enlarged cardiac silhouette given projection and technique. Aortic atherosclerosis with calcification. Stable chronic interstitial changes of the lungs. No focal consolidation. No pleural effusion or pneumothorax. No acute osseous abnormality is evident. IMPRESSION: 1. Stable chronic interstitial changes of the lungs. No acute pulmonary process identified. 2.  Aortic Atherosclerosis (ICD10-I70.0).  Stable mild cardiomegaly. Electronically Signed   By: Kristine Garbe M.D.   On: 12/14/2018 22:29   Ct Head Wo Contrast  Result Date: 12/14/2018 CLINICAL DATA:  Unwitnessed fall today. Altered mental status. History of dementia. EXAM: CT HEAD WITHOUT CONTRAST CT CERVICAL SPINE WITHOUT CONTRAST TECHNIQUE: Multidetector CT imaging of the head and cervical spine was performed following the standard protocol without intravenous contrast. Multiplanar CT image reconstructions of the cervical spine were also generated. COMPARISON:  01/25/2017 FINDINGS: CT HEAD FINDINGS Brain: There is no evidence of acute infarct, intracranial hemorrhage, mass, midline shift, or extra-axial fluid collection.  Mild-to-moderate cerebral atrophy is unchanged. Periventricular white matter hypodensities are unchanged and nonspecific but compatible with minimal chronic small vessel ischemic disease, not greater than expected for age. Vascular: Calcified atherosclerosis at the skull base. No hyperdense vessel. Skull: No fracture or focal osseous lesion. Sinuses/Orbits: Minimal debris in the left sphenoid sinus. Clear mastoid air cells. Unremarkable orbits. Other: None. CT CERVICAL SPINE FINDINGS Alignment: Cervical spine straightening. No listhesis. Skull base and vertebrae: No acute fracture or suspicious osseous lesion. Soft tissues and spinal canal: No prevertebral fluid or swelling. No visible canal hematoma. Disc levels: Unchanged disc degeneration with disc space narrowing at C5-6 greater than C6-7. No evidence of osseous neural foraminal or spinal canal stenosis. Upper chest: Mild centrilobular emphysema in apically lung scarring. Other: None. IMPRESSION: 1. No evidence of acute intracranial abnormality. 2. No acute cervical spine fracture or subluxation. Electronically Signed   By: Zenia Resides  Jeralyn Ruths M.D.   On: 12/14/2018 20:53   Ct Cervical Spine Wo Contrast  Result Date: 12/14/2018 CLINICAL DATA:  Unwitnessed fall today. Altered mental status. History of dementia. EXAM: CT HEAD WITHOUT CONTRAST CT CERVICAL SPINE WITHOUT CONTRAST TECHNIQUE: Multidetector CT imaging of the head and cervical spine was performed following the standard protocol without intravenous contrast. Multiplanar CT image reconstructions of the cervical spine were also generated. COMPARISON:  01/25/2017 FINDINGS: CT HEAD FINDINGS Brain: There is no evidence of acute infarct, intracranial hemorrhage, mass, midline shift, or extra-axial fluid collection. Mild-to-moderate cerebral atrophy is unchanged. Periventricular white matter hypodensities are unchanged and nonspecific but compatible with minimal chronic small vessel ischemic disease, not greater  than expected for age. Vascular: Calcified atherosclerosis at the skull base. No hyperdense vessel. Skull: No fracture or focal osseous lesion. Sinuses/Orbits: Minimal debris in the left sphenoid sinus. Clear mastoid air cells. Unremarkable orbits. Other: None. CT CERVICAL SPINE FINDINGS Alignment: Cervical spine straightening. No listhesis. Skull base and vertebrae: No acute fracture or suspicious osseous lesion. Soft tissues and spinal canal: No prevertebral fluid or swelling. No visible canal hematoma. Disc levels: Unchanged disc degeneration with disc space narrowing at C5-6 greater than C6-7. No evidence of osseous neural foraminal or spinal canal stenosis. Upper chest: Mild centrilobular emphysema in apically lung scarring. Other: None. IMPRESSION: 1. No evidence of acute intracranial abnormality. 2. No acute cervical spine fracture or subluxation. Electronically Signed   By: Logan Bores M.D.   On: 12/14/2018 20:53   Dg Hip Unilat W Or Wo Pelvis 2-3 Views Right  Result Date: 12/14/2018 CLINICAL DATA:  85 y/o  F; unwitnessed fall. EXAM: DG HIP (WITH OR WITHOUT PELVIS) 2-3V RIGHT COMPARISON:  None. FINDINGS: Acute right femoral neck fracture with mild proximal migration of the femoral shaft. No joint dislocation. No pelvic fracture or pelvic diastasis identified. IMPRESSION: Acute right femoral neck fracture with mild proximal migration of the femoral shaft. Electronically Signed   By: Kristine Garbe M.D.   On: 12/14/2018 22:28    Positive ROS: All other systems have been reviewed and were otherwise negative with the exception of those mentioned in the HPI and as above.  Physical Exam: General: Alert, no acute distress Skin: No lesions in the area of chief complaint Neurologic: Sensation intact distally Psychiatric: Patient awake and in no acute distress.  MUSCULOSKELETAL: Right lower extremity: Patient skin is intact.  There is no erythema ecchymosis or significant swelling.  Her right  thigh compartment is soft and compressible.  She has palpable pedal pulses.  Patient can dorsiflex and plantarflex her ankle and states she has intact sensation to light touch.  Assessment: Right closed, displaced femoral neck hip fracture.  Plan: Patient has sustained a displaced right femoral neck hip fracture.  I am recommending a right hip hemiarthroplasty as management for this injury.  I have spoken with the patient's brother and niece to explain the nature of her fracture.  I have discussed the details of the right hip hemiarthroplasty surgery as well as the postoperative course.  I have also discussed the risks and benefits of surgery.  They understand the risks include but are not limited to infection requiring the removal of the prosthesis, bleeding requiring blood transfusion, nerve or blood vessel injury, fracture, dislocation, change in lower extremity rotation, leg length discrepancy, hardware failure and the need for further surgery.  They also understand the medical risks include but are not limited to DVT and pulmonary embolism, myocardial infarction, stroke, pneumonia, respiratory failure  and death.  They understood these risks and are in agreement with the plan for surgery.    I have reviewed the labs and radiographic studies in preparation for this case.  Consent was obtained from the patient's niece.    Thornton Park, MD    12/15/2018 11:55 AM

## 2018-12-15 NOTE — Op Note (Signed)
12/15/2018  5:44 PM  PATIENT:  Savannah Young   MRN: [id number]  PRE-OPERATIVE DIAGNOSIS:  right femoral neck hip fracture  POST-OPERATIVE DIAGNOSIS:  right femoral neck hip fracture  PROCEDURE:  Procedure(s): Right hip hemiarthroplasty    PREOPERATIVE INDICATIONS:    Savannah Young is an 85 y.o. female who was admitted with a diagnosis of right hip fracture.  I have recommended surgical fixation with hemiarthroplasty for this injury. I have explained the surgery and the postoperative course to the patient's family who have agreed with surgical management of this fracture.    The risks benefits and alternatives were discussed with the patient and their family including but not limited to the risks of  infection requiring removal of the prosthesis, bleeding requiring blood transfusion, nerve injury especially to the sciatic nerve leading to foot drop or lower extremity numbness, periprosthetic fracture, dislocation leg length discrepancy, change in lower extremity rotation persistent hip pain, loosening or failure of the components and the need for revision surgery. Medical risks include but are not limited to DVT and pulmonary embolism, myocardial infarction, stroke, pneumonia, respiratory failure and death.  OPERATIVE REPORT     SURGEON:  Thornton Park, MD    ASSISTANT:  Surgical tech    ANESTHESIA: General    COMPLICATIONS:  None.   EBL:  200 cc  SPECIMEN: Femoral head to pathology    COMPONENTS:  St[employer]ker Accolade 2 femoral component size 2 with a 41 mm +8 neck adjustment sleeve.    PROCEDURE IN DETAIL:   The patient was met in the holding area and  identified.  The appropriate hip was identified and marked at the operative site after verbally confirming with the patient that this was the correct site of surgery.  The patient was then transported to the OR  and underwent general anesthesia.  The patient was then placed in the lateral decubitus position with the operative  side up and secured on the operating room table with a pegboard and all bony prominences were adequately padded. This included an axillary roll and additional padding around the nonoperative leg to prevent compression to the common peroneal nerve.    The operative lower extremity was prepped and draped in a sterile fashion.  A time out was performed prior to incision to verify patient's name, date of birth, medical record number, correct site of surgery correct procedure to be performed. The timeout was also used to verify the patient received antibiotics now appropriate instruments, implants and radiographic studies were available in the room. Once all in attendance were in agreement case began.    A posterolateral approach was utilized via sharp dissection  carried down to the subcutaneous tissue.  Bleeding vessels were coagulated using electrocautery.  The fascia lata was identified and incised along the length of the skin incision.  The gluteus maximus muscle was then split in line with its fibers. Self-retaining retractors were  inserted.  With the hip internally rotated, the short external rotators  were identified and removed from the posterior attachment from the greater trochanter. The piriformis was tagged for later repair. The capsule was identified and a T-shaped capsulotomy was performed. The capsule was tagged with #2 Tycron for later repair.  The femoral neck fracture was exposed, and the femoral head was removed using a corkscrew device. This was measured to be 41 mm in diameter. The attention was then turned to proximal femur preparation.  An oscillating saw was used to perform a proximal femoral osteotomy 1  fingerbreadth above the lesser trochanter. The trial 41 mm femoral head was placed into the acetabulum and had an excellent suction fit. The attention was then turned back to femoral preparation.    A femoral skid and Cobra retractor were placed under the femoral neck to allow for  adequate visualization. A box osteotome was used to make the initial entry into the proximal femur. A single hand reamer was used to prepare the femoral canal. A T-shaped femoral canal sounder was then used to ensure no penetration femoral cortex had occurred during reaming. The proximal femur was then sequentially broached by hand. A size 2 femoral trial broach was found to have best medial to lateral canal fit. Once adequate mediolateral canal fill was achieved the trial femoral broach, neck, and head was assembled and the hip was reduced. It was found to have excellent stability, equivalent leg lengths with functional range of motion. The trial components were then removed.  I copiously irrigated the femoral canal and then impacted the real femoral prosthesis into place into the appropriate version, slightly anteverted to the normal anatomy, and I impacted the actual 41 mm Unitrax femoral component with a +8 neck adjustment sleeve into place. The hip was then reduced and taken through functional range of motion and found to have excellent stability. Leg lengths were restored. The hip joint was copiously irrigated.   A soft tissue repair of the capsule and external rotators was performed using #2 Tycron Excellent posterior capsular repair was achieved. The fascia lata was then closed with interrupted 0 Vicryl suture. The subcutaneous tissues were closed with 2-0 Vicryl and the skin approximated with staples.   The patient was then placed supine on the operative table. Leg lengths were checked clinically and found to be equivalent. An abduction pillow was placed between the lower extremities. The patient was then transferred to a hospital bed and brought to the PACU in stable condition. I was scrubbed and present the entire case and all sharp and instrument counts were correct at the conclusion of the case. I spoke with the patient's family via phone post-operatively by phone to let them know the case was  completed without complication patient was stable in recovery room.  Patient's families are not allowed in the hospital currently due to COVID-19.   [name], MD Orthopedic Surgeon

## 2018-12-16 LAB — CBC
HCT: 33.3 % — ABNORMAL LOW (ref 36.0–46.0)
Hemoglobin: 10.5 g/dL — ABNORMAL LOW (ref 12.0–15.0)
MCH: 29 pg (ref 26.0–34.0)
MCHC: 31.5 g/dL (ref 30.0–36.0)
MCV: 92 fL (ref 80.0–100.0)
Platelets: 1058 10*3/uL (ref 150–400)
RBC: 3.62 MIL/uL — ABNORMAL LOW (ref 3.87–5.11)
RDW: 15.8 % — ABNORMAL HIGH (ref 11.5–15.5)
WBC: 8.4 10*3/uL (ref 4.0–10.5)
nRBC: 0 % (ref 0.0–0.2)

## 2018-12-16 LAB — BASIC METABOLIC PANEL
Anion gap: 6 (ref 5–15)
BUN: 19 mg/dL (ref 8–23)
CO2: 24 mmol/L (ref 22–32)
Calcium: 7.8 mg/dL — ABNORMAL LOW (ref 8.9–10.3)
Chloride: 112 mmol/L — ABNORMAL HIGH (ref 98–111)
Creatinine, Ser: 0.92 mg/dL (ref 0.44–1.00)
GFR calc Af Amer: >60 mL/min (ref >60)
GFR calc non Af Amer: 57 mL/min — ABNORMAL LOW (ref >60)
Glucose, Bld: 130 mg/dL — ABNORMAL HIGH (ref 70–99)
Potassium: 3.9 mmol/L (ref 3.5–5.1)
Sodium: 142 mmol/L (ref 135–145)

## 2018-12-16 NOTE — Progress Notes (Signed)
POD 1 Right hip. Dressing dry and intact. Abduction pillow in place. Mittens in place. Patient confused, VSS, O2 @ 2L. WBAT. From [facility]. No complaints at the moment. Lowbed in lowest position, call bell in reach. Continue to monitor.

## 2018-12-16 NOTE — Progress Notes (Signed)
[facility] NAME: Savannah Young    MR#:  [id number]  DATE OF BIRTH:  [birth year]  SUBJECTIVE:   Status post right hip hemiarthroplasty postop day #1 today.  Remains somewhat lethargic but follows simple commands.  No other acute events overnight.  REVIEW OF SYSTEMS:    Review of Systems  Unable to perform ROS: Dementia    Nutrition: clear liquids and advance as tolerated.  Tolerating Diet: Yes Tolerating PT: Await Eval.    DRUG ALLERGIES:  No Known Allergies  VITALS:  Blood pressure (!) 126/55, pulse 100, temperature (!) 97.5 F (36.4 C), temperature source Oral, resp. rate 20, height 5' (1.524 m), weight 45.4 kg, last menstrual period 01/20/2016, SpO2 93 %.  PHYSICAL EXAMINATION:   Physical Exam  GENERAL:  85 y.o.-year-old thin demented patient lying in bed in no acute distress.  EYES: Pupils equal, round, reactive to light and accommodation. No scleral icterus. Extraocular muscles intact.  HEENT: Head atraumatic, normocephalic. Oropharynx and nasopharynx clear.  NECK:  Supple, no jugular venous distention. No thyroid enlargement, no tenderness.  LUNGS: Normal breath sounds bilaterally, no wheezing, rales, rhonchi. No use of accessory muscles of respiration.  CARDIOVASCULAR: S1, S2 normal. No murmurs, rubs, or gallops.  ABDOMEN: Soft, nontender, nondistended. Bowel sounds present. No organomegaly or mass.  EXTREMITIES: No cyanosis, clubbing or edema b/l. Right Hip dressing in place with no acute bleeding noted.  NEUROLOGIC: Cranial nerves II through XII are intact. No focal Motor or sensory deficits b/l.  Globally weak.    PSYCHIATRIC: The patient is alert and oriented x 1.  SKIN: No obvious rash, lesion, or ulcer.    LABORATORY PANEL:   CBC Recent Labs  Lab 12/16/18 0509  WBC 8.4  HGB 10.5*  HCT 33.3*  PLT 1,058*    ------------------------------------------------------------------------------------------------------------------  Chemistries  Recent Labs  Lab 12/16/18 0509  NA 142  K 3.9  CL 112*  CO2 24  GLUCOSE 130*  BUN 19  CREATININE 0.92  CALCIUM 7.8*   ------------------------------------------------------------------------------------------------------------------  Cardiac Enzymes Recent Labs  Lab 12/14/18 2020  TROPONINI <0.03   ------------------------------------------------------------------------------------------------------------------  RADIOLOGY:  Dg Chest 1 View  Result Date: 12/14/2018 CLINICAL DATA:  85 y/o  F; fall. EXAM: CHEST  1 VIEW COMPARISON:  01/25/2017 chest radiograph FINDINGS: Stable mildly enlarged cardiac silhouette given projection and technique. Aortic atherosclerosis with calcification. Stable chronic interstitial changes of the lungs. No focal consolidation. No pleural effusion or pneumothorax. No acute osseous abnormality is evident. IMPRESSION: 1. Stable chronic interstitial changes of the lungs. No acute pulmonary process identified. 2.  Aortic Atherosclerosis (ICD10-I70.0).  Stable mild cardiomegaly. Electronically Signed   By: Kristine Garbe M.D.   On: 12/14/2018 22:29   Ct Head Wo Contrast  Result Date: 12/14/2018 CLINICAL DATA:  Unwitnessed fall today. Altered mental status. History of dementia. EXAM: CT HEAD WITHOUT CONTRAST CT CERVICAL SPINE WITHOUT CONTRAST TECHNIQUE: Multidetector CT imaging of the head and cervical spine was performed following the standard protocol without intravenous contrast. Multiplanar CT image reconstructions of the cervical spine were also generated. COMPARISON:  01/25/2017 FINDINGS: CT HEAD FINDINGS Brain: There is no evidence of acute infarct, intracranial hemorrhage, mass, midline shift, or extra-axial fluid collection. Mild-to-moderate cerebral atrophy is unchanged. Periventricular white matter hypodensities  are unchanged and nonspecific but compatible with minimal chronic small vessel ischemic disease, not greater than expected for age. Vascular: Calcified atherosclerosis at the skull base. No hyperdense vessel. Skull: No fracture or  focal osseous lesion. Sinuses/Orbits: Minimal debris in the left sphenoid sinus. Clear mastoid air cells. Unremarkable orbits. Other: None. CT CERVICAL SPINE FINDINGS Alignment: Cervical spine straightening. No listhesis. Skull base and vertebrae: No acute fracture or suspicious osseous lesion. Soft tissues and spinal canal: No prevertebral fluid or swelling. No visible canal hematoma. Disc levels: Unchanged disc degeneration with disc space narrowing at C5-6 greater than C6-7. No evidence of osseous neural foraminal or spinal canal stenosis. Upper chest: Mild centrilobular emphysema in apically lung scarring. Other: None. IMPRESSION: 1. No evidence of acute intracranial abnormality. 2. No acute cervical spine fracture or subluxation. Electronically Signed   By: Logan Bores M.D.   On: 12/14/2018 20:53   Ct Cervical Spine Wo Contrast  Result Date: 12/14/2018 CLINICAL DATA:  Unwitnessed fall today. Altered mental status. History of dementia. EXAM: CT HEAD WITHOUT CONTRAST CT CERVICAL SPINE WITHOUT CONTRAST TECHNIQUE: Multidetector CT imaging of the head and cervical spine was performed following the standard protocol without intravenous contrast. Multiplanar CT image reconstructions of the cervical spine were also generated. COMPARISON:  01/25/2017 FINDINGS: CT HEAD FINDINGS Brain: There is no evidence of acute infarct, intracranial hemorrhage, mass, midline shift, or extra-axial fluid collection. Mild-to-moderate cerebral atrophy is unchanged. Periventricular white matter hypodensities are unchanged and nonspecific but compatible with minimal chronic small vessel ischemic disease, not greater than expected for age. Vascular: Calcified atherosclerosis at the skull base. No hyperdense  vessel. Skull: No fracture or focal osseous lesion. Sinuses/Orbits: Minimal debris in the left sphenoid sinus. Clear mastoid air cells. Unremarkable orbits. Other: None. CT CERVICAL SPINE FINDINGS Alignment: Cervical spine straightening. No listhesis. Skull base and vertebrae: No acute fracture or suspicious osseous lesion. Soft tissues and spinal canal: No prevertebral fluid or swelling. No visible canal hematoma. Disc levels: Unchanged disc degeneration with disc space narrowing at C5-6 greater than C6-7. No evidence of osseous neural foraminal or spinal canal stenosis. Upper chest: Mild centrilobular emphysema in apically lung scarring. Other: None. IMPRESSION: 1. No evidence of acute intracranial abnormality. 2. No acute cervical spine fracture or subluxation. Electronically Signed   By: Logan Bores M.D.   On: 12/14/2018 20:53   Dg Hip Port Unilat With Pelvis 1v Right  Result Date: 12/15/2018 CLINICAL DATA:  85 year old female status post right hip hemiarthroplasty following femoral neck fracture. EXAM: DG HIP (WITH OR WITHOUT PELVIS) 1V PORT RIGHT COMPARISON:  12/14/2018. FINDINGS: AP and cross-table lateral views. Proximal right femur arthroplasty with normally located femoral head component in the right acetabulum. Hardware appears intact. Surrounding postoperative changes to the soft tissues including skin staples in place. No other acute osseous abnormality identified. IMPRESSION: Proximal right femur arthroplasty with no adverse features. Electronically Signed   By: Genevie Ann M.D.   On: 12/15/2018 18:45   Dg Hip Unilat W Or Wo Pelvis 2-3 Views Right  Result Date: 12/14/2018 CLINICAL DATA:  85 y/o  F; unwitnessed fall. EXAM: DG HIP (WITH OR WITHOUT PELVIS) 2-3V RIGHT COMPARISON:  None. FINDINGS: Acute right femoral neck fracture with mild proximal migration of the femoral shaft. No joint dislocation. No pelvic fracture or pelvic diastasis identified. IMPRESSION: Acute right femoral neck fracture with  mild proximal migration of the femoral shaft. Electronically Signed   By: Kristine Garbe M.D.   On: 12/14/2018 22:28     ASSESSMENT AND PLAN:   85 year old female with past medical history of essential thrombocytosis, dementia, hypertension, COPD, diabetes who presented to the hospital after a mechanical fall and noted to have a right  hip fracture.  1.  Status post fall and right hip fracture- this post right hip hemiarthroplasty.  Postop day #1 today. -Continue pain control and further care as per orthopedics. -Await physical therapy evaluation.  2.  Essential thrombocytosis-patient's platelet count is elevated. -Continue hydroxyurea.  3.  Essential hypertension-continue metoprolol.  4.  GERD-continue Protonix.  5. Dementia-continue Namenda, PRN Ativan and Seroquel.  Patient high risk for sundowning.  All the records are reviewed and case discussed with Care Management/Social Worker. Management plans discussed with the patient, family and they are in agreement.  CODE STATUS: Full code  DVT Prophylaxis: Lovenox   TOTAL TIME TAKING CARE OF THIS PATIENT: 25 minutes.   POSSIBLE D/C IN 2-3 DAYS, DEPENDING ON CLINICAL CONDITION.   Henreitta Leber M.D on 12/16/2018 at 1:09 PM  Between 7am to 6pm - Pager - [phone]  After 6pm go to [email or web address] - Proofreader  [facility]  Office  [phone]  CC: Primary care physician; Raelyn Number, MD

## 2018-12-16 NOTE — Progress Notes (Signed)
Subjective:  POD #1 s/p right total hip arthroplasty.   Patient has advanced dementia and is unable to provide an accurate history.  She does not indicate that she is in pain.  Patient is seen in her hospital bed.  Objective:   VITALS:   Vitals:   12/15/18 2209 12/16/18 0006 12/16/18 0448 12/16/18 0828  BP: 134/71 (!) 143/70 139/75 (!) 126/55  Pulse: 77 75 81 100  Resp: 17 17 17 20   Temp: 98.7 F (37.1 C)  98.6 F (37 C) (!) 97.5 F (36.4 C)  TempSrc: Axillary  Axillary Oral  SpO2: 100% 100% 97% 93%  Weight:      Height:        PHYSICAL EXAM: Right lower extremity Neurovascular intact Intact pulses distally Dorsiflexion/Plantar flexion intact Incision: dressing C/D/I No cellulitis present Compartment soft  LABS  Results for orders placed or performed during the hospital encounter of 12/14/18 (from the past 24 hour(s))  Glucose, capillary     Status: Abnormal   Collection Time: 12/15/18  5:39 PM  Result Value Ref Range   Glucose-Capillary 121 (H) 70 - 99 mg/dL  CBC     Status: Abnormal   Collection Time: 12/16/18  5:09 AM  Result Value Ref Range   WBC 8.4 4.0 - 10.5 K/uL   RBC 3.62 (L) 3.87 - 5.11 MIL/uL   Hemoglobin 10.5 (L) 12.0 - 15.0 g/dL   HCT 33.3 (L) 36.0 - 46.0 %   MCV 92.0 80.0 - 100.0 fL   MCH 29.0 26.0 - 34.0 pg   MCHC 31.5 30.0 - 36.0 g/dL   RDW 15.8 (H) 11.5 - 15.5 %   Platelets 1,058 (HH) 150 - 400 K/uL   nRBC 0.0 0.0 - 0.2 %  Basic metabolic panel     Status: Abnormal   Collection Time: 12/16/18  5:09 AM  Result Value Ref Range   Sodium 142 135 - 145 mmol/L   Potassium 3.9 3.5 - 5.1 mmol/L   Chloride 112 (H) 98 - 111 mmol/L   CO2 24 22 - 32 mmol/L   Glucose, Bld 130 (H) 70 - 99 mg/dL   BUN 19 8 - 23 mg/dL   Creatinine, Ser 0.92 0.44 - 1.00 mg/dL   Calcium 7.8 (L) 8.9 - 10.3 mg/dL   GFR calc non Af Amer 57 (L) >60 mL/min   GFR calc Af Amer >60 >60 mL/min   Anion gap 6 5 - 15    Dg Chest 1 View  Result Date: 12/14/2018 CLINICAL DATA:   85 y/o  F; fall. EXAM: CHEST  1 VIEW COMPARISON:  01/25/2017 chest radiograph FINDINGS: Stable mildly enlarged cardiac silhouette given projection and technique. Aortic atherosclerosis with calcification. Stable chronic interstitial changes of the lungs. No focal consolidation. No pleural effusion or pneumothorax. No acute osseous abnormality is evident. IMPRESSION: 1. Stable chronic interstitial changes of the lungs. No acute pulmonary process identified. 2.  Aortic Atherosclerosis (ICD10-I70.0).  Stable mild cardiomegaly. Electronically Signed   By: Kristine Garbe M.D.   On: 12/14/2018 22:29   Ct Head Wo Contrast  Result Date: 12/14/2018 CLINICAL DATA:  Unwitnessed fall today. Altered mental status. History of dementia. EXAM: CT HEAD WITHOUT CONTRAST CT CERVICAL SPINE WITHOUT CONTRAST TECHNIQUE: Multidetector CT imaging of the head and cervical spine was performed following the standard protocol without intravenous contrast. Multiplanar CT image reconstructions of the cervical spine were also generated. COMPARISON:  01/25/2017 FINDINGS: CT HEAD FINDINGS Brain: There is no evidence of acute infarct, intracranial  hemorrhage, mass, midline shift, or extra-axial fluid collection. Mild-to-moderate cerebral atrophy is unchanged. Periventricular white matter hypodensities are unchanged and nonspecific but compatible with minimal chronic small vessel ischemic disease, not greater than expected for age. Vascular: Calcified atherosclerosis at the skull base. No hyperdense vessel. Skull: No fracture or focal osseous lesion. Sinuses/Orbits: Minimal debris in the left sphenoid sinus. Clear mastoid air cells. Unremarkable orbits. Other: None. CT CERVICAL SPINE FINDINGS Alignment: Cervical spine straightening. No listhesis. Skull base and vertebrae: No acute fracture or suspicious osseous lesion. Soft tissues and spinal canal: No prevertebral fluid or swelling. No visible canal hematoma. Disc levels: Unchanged disc  degeneration with disc space narrowing at C5-6 greater than C6-7. No evidence of osseous neural foraminal or spinal canal stenosis. Upper chest: Mild centrilobular emphysema in apically lung scarring. Other: None. IMPRESSION: 1. No evidence of acute intracranial abnormality. 2. No acute cervical spine fracture or subluxation. Electronically Signed   By: Logan Bores M.D.   On: 12/14/2018 20:53   Ct Cervical Spine Wo Contrast  Result Date: 12/14/2018 CLINICAL DATA:  Unwitnessed fall today. Altered mental status. History of dementia. EXAM: CT HEAD WITHOUT CONTRAST CT CERVICAL SPINE WITHOUT CONTRAST TECHNIQUE: Multidetector CT imaging of the head and cervical spine was performed following the standard protocol without intravenous contrast. Multiplanar CT image reconstructions of the cervical spine were also generated. COMPARISON:  01/25/2017 FINDINGS: CT HEAD FINDINGS Brain: There is no evidence of acute infarct, intracranial hemorrhage, mass, midline shift, or extra-axial fluid collection. Mild-to-moderate cerebral atrophy is unchanged. Periventricular white matter hypodensities are unchanged and nonspecific but compatible with minimal chronic small vessel ischemic disease, not greater than expected for age. Vascular: Calcified atherosclerosis at the skull base. No hyperdense vessel. Skull: No fracture or focal osseous lesion. Sinuses/Orbits: Minimal debris in the left sphenoid sinus. Clear mastoid air cells. Unremarkable orbits. Other: None. CT CERVICAL SPINE FINDINGS Alignment: Cervical spine straightening. No listhesis. Skull base and vertebrae: No acute fracture or suspicious osseous lesion. Soft tissues and spinal canal: No prevertebral fluid or swelling. No visible canal hematoma. Disc levels: Unchanged disc degeneration with disc space narrowing at C5-6 greater than C6-7. No evidence of osseous neural foraminal or spinal canal stenosis. Upper chest: Mild centrilobular emphysema in apically lung scarring.  Other: None. IMPRESSION: 1. No evidence of acute intracranial abnormality. 2. No acute cervical spine fracture or subluxation. Electronically Signed   By: Logan Bores M.D.   On: 12/14/2018 20:53   Dg Hip Port Unilat With Pelvis 1v Right  Result Date: 12/15/2018 CLINICAL DATA:  85 year old female status post right hip hemiarthroplasty following femoral neck fracture. EXAM: DG HIP (WITH OR WITHOUT PELVIS) 1V PORT RIGHT COMPARISON:  12/14/2018. FINDINGS: AP and cross-table lateral views. Proximal right femur arthroplasty with normally located femoral head component in the right acetabulum. Hardware appears intact. Surrounding postoperative changes to the soft tissues including skin staples in place. No other acute osseous abnormality identified. IMPRESSION: Proximal right femur arthroplasty with no adverse features. Electronically Signed   By: Genevie Ann M.D.   On: 12/15/2018 18:45   Dg Hip Unilat W Or Wo Pelvis 2-3 Views Right  Result Date: 12/14/2018 CLINICAL DATA:  85 y/o  F; unwitnessed fall. EXAM: DG HIP (WITH OR WITHOUT PELVIS) 2-3V RIGHT COMPARISON:  None. FINDINGS: Acute right femoral neck fracture with mild proximal migration of the femoral shaft. No joint dislocation. No pelvic fracture or pelvic diastasis identified. IMPRESSION: Acute right femoral neck fracture with mild proximal migration of the femoral shaft. Electronically  Signed   By: Kristine Garbe M.D.   On: 12/14/2018 22:28    Assessment/Plan: 1 Day Post-Op   Principal Problem:   Closed right hip fracture (HCC) Active Problems:   Essential thrombocytosis (HCC)   Dementia (HCC)   Type 2 diabetes mellitus without complication (HCC)   COPD (chronic obstructive pulmonary disease) ([place])  Patient's hemoglobin and hematocrit remained stable.  Continue physical therapy as patient can tolerate.  Patient is received 24 hours of postop antibiotics.  Foley catheter is removed.  Continue current pain management.  Patient will start  Lovenox today for DVT prophylaxis.    Thornton Park , MD 12/16/2018, 1:19 PM

## 2018-12-16 NOTE — Plan of Care (Signed)
  Problem: Nutrition: Goal: Adequate nutrition will be maintained Outcome: Progressing   Problem: Coping: Goal: Level of anxiety will decrease Outcome: Progressing   Problem: Pain Managment: Goal: General experience of comfort will improve Outcome: Progressing   Problem: Safety: Goal: Ability to remain free from injury will improve Outcome: Progressing   

## 2018-12-16 NOTE — Evaluation (Signed)
Physical Therapy Evaluation Patient Details Name: Savannah Young MRN: [id number] DOB: [birth year] Today's Date: 12/16/2018   History of Present Illness  Patient was admitted s/p R hip fracture with fixation with hemiarthroplasty (12/15/2018). PMH: Essential thrombocytosis, advanced dementia, DM2, COPD  Clinical Impression  Patient found supine in bed upon PT arrival. Patient oriented to self and able to state her name. Patient has baseline dementia which has limited her communication grossly to Y/N answers per her chart. Patient agreed to attempt to sit EOB with PT and responded "yes" to therapy. Patient unable/unwilling to follow one-step commands and if encouraged patient responds with unintelligible resistance and agitation. PT able to get patient EOB with NTs for bathing; however, patient unable/unwilling to sit EOB with BUE and BLE supported and Max Assist to prevent posterior/lateral leaning. All patient transfers were Max Assist and ultimately patient refused to participate further.   Based on patient's baseline cognition and presentation during evaluation, patient likely will tolerate QD physical therapy for a trial period as opposed to the convention BID treatments. In the event that the patient does not tolerate QD PT, PT may decrease frequency of therapeutic intervention. Based on today's evaluation, patient will require skilled therapeutic intervention to improve ROM, strength, mobility, balance, and decrease risk of falls. PT recommends SNF for discharge based on patient's current presentation and mobility status.  Patient left supine in bed with nursing performing bathing/grooming.     Follow Up Recommendations SNF    Equipment Recommendations  Other (comment)(TBD at next venue of care)    Recommendations for Other Services       Precautions / Restrictions Precautions Precautions: Fall Restrictions Weight Bearing Restrictions: Yes RLE Weight Bearing: Weight bearing as  tolerated Other Position/Activity Restrictions: RLE; s/p R hip fx      Mobility  Bed Mobility Overal bed mobility: Needs Assistance Bed Mobility: Supine to Sit;Sit to Supine     Supine to sit: Max assist Sit to supine: Max assist   General bed mobility comments: PT attempted to get patient EOB for bathing with NT. Patient unwilling to participate.  Transfers Overall transfer level: Needs assistance               General transfer comment: Unable to assess at this time 2/2 cognition/refusal to participate in therapy.  Ambulation/Gait             General Gait Details: Unable to assess at this time 2/2 cognition/refusal to participate in therapy.  Stairs            Wheelchair Mobility    Modified Rankin (Stroke Patients Only)       Balance Overall balance assessment: Needs assistance Sitting-balance support: Feet supported;Bilateral upper extremity supported Sitting balance-Leahy Scale: Zero Sitting balance - Comments: Patient's sitting balance assessment likely impacted by her cognition and unwillingness to participate in therapy Postural control: Posterior lean;Right lateral lean     Standing balance comment: Unable to assess at this time 2/2 cognition/refusal to participate in therapy.                             Pertinent Vitals/Pain Pain Assessment: Faces Faces Pain Scale: Hurts a little bit Pain Location: R hip Pain Intervention(s): Limited activity within patient's tolerance;Repositioned;Monitored during session    Home Living                        Prior Function  Hand Dominance        Extremity/Trunk Assessment   Upper Extremity Assessment Upper Extremity Assessment: Difficult to assess due to impaired cognition    Lower Extremity Assessment Lower Extremity Assessment: Difficult to assess due to impaired cognition(Patient unwilling to attempt movement.)       Communication       Cognition Arousal/Alertness: Lethargic Behavior During Therapy: Agitated;Flat affect Overall Cognitive Status: History of cognitive impairments - at baseline                                 General Comments: Patient was grossly not responsive to PT questions/commands during evaluation, but did occasionally agree to participate verbally with no physical follow-through/task completion.      General Comments      Exercises     Assessment/Plan    PT Assessment Patient needs continued PT services  PT Problem List Decreased strength;Decreased mobility;Decreased safety awareness;Decreased range of motion;Decreased knowledge of precautions;Decreased cognition;Decreased activity tolerance;Decreased balance;Decreased knowledge of use of DME;Pain       PT Treatment Interventions Functional mobility training;Balance training;Patient/family education;DME instruction;Gait training;Therapeutic activities;Neuromuscular re-education;Therapeutic exercise;Stair training    PT Goals (Current goals can be found in the Care Plan section)  Acute Rehab PT Goals PT Goal Formulation: Patient unable to participate in goal setting    Frequency 7X/week   Barriers to discharge        Co-evaluation               AM-PAC PT "6 Clicks" Mobility  Outcome Measure Help needed turning from your back to your side while in a flat bed without using bedrails?: Total Help needed moving from lying on your back to sitting on the side of a flat bed without using bedrails?: Total Help needed moving to and from a bed to a chair (including a wheelchair)?: Total Help needed standing up from a chair using your arms (e.g., wheelchair or bedside chair)?: Total Help needed to walk in hospital room?: Total Help needed climbing 3-5 steps with a railing? : Total 6 Click Score: 6    End of Session Equipment Utilized During Treatment: Gait belt;Oxygen Activity Tolerance: Patient limited by pain;Other  (comment)(Treatment limited secondary to advanced dementia) Patient left: in bed;with nursing/sitter in room Nurse Communication: Mobility status PT Visit Diagnosis: Muscle weakness (generalized) (M62.81);Difficulty in walking, not elsewhere classified (R26.2);History of falling (Z91.81);Pain Pain - Right/Left: Right Pain - part of body: Hip    Time: 3007-6226 PT Time Calculation (min) (ACUTE ONLY): 23 min   Charges:   PT Evaluation $PT Eval Low Complexity: 1 Low         Myles Gip PT, DPT [phone]  12/16/2018, 1:59 PM

## 2018-12-17 ENCOUNTER — Encounter: Payer: Self-pay | Admitting: Orthopedic Surgery

## 2018-12-17 LAB — BASIC METABOLIC PANEL
Anion gap: 4 — ABNORMAL LOW (ref 5–15)
BUN: 15 mg/dL (ref 8–23)
CO2: 24 mmol/L (ref 22–32)
Calcium: 7.4 mg/dL — ABNORMAL LOW (ref 8.9–10.3)
Chloride: 119 mmol/L — ABNORMAL HIGH (ref 98–111)
Creatinine, Ser: 0.75 mg/dL (ref 0.44–1.00)
GFR calc Af Amer: >60 mL/min (ref >60)
GFR calc non Af Amer: >60 mL/min (ref >60)
Glucose, Bld: 111 mg/dL — ABNORMAL HIGH (ref 70–99)
Potassium: 4.1 mmol/L (ref 3.5–5.1)
Sodium: 147 mmol/L — ABNORMAL HIGH (ref 135–145)

## 2018-12-17 LAB — CBC
HCT: 28.3 % — ABNORMAL LOW (ref 36.0–46.0)
Hemoglobin: 8.8 g/dL — ABNORMAL LOW (ref 12.0–15.0)
MCH: 29.2 pg (ref 26.0–34.0)
MCHC: 31.1 g/dL (ref 30.0–36.0)
MCV: 94 fL (ref 80.0–100.0)
Platelets: 925 10*3/uL (ref 150–400)
RBC: 3.01 MIL/uL — ABNORMAL LOW (ref 3.87–5.11)
RDW: 15.9 % — ABNORMAL HIGH (ref 11.5–15.5)
WBC: 4.6 10*3/uL (ref 4.0–10.5)
nRBC: 0 % (ref 0.0–0.2)

## 2018-12-17 LAB — PATHOLOGIST SMEAR REVIEW

## 2018-12-17 NOTE — TOC Initial Note (Signed)
Transition of Care [facility]) - Initial/Assessment Note    Patient Details  Name: Savannah Young MRN: [id number] Date of Birth: 04/23/1932  Transition of Care [facility]) CM/SW Contact:    Su Hilt, RN Phone Number: 12/17/2018, 3:59 PM  Clinical Narrative:                 Called and spoke to the patient's niece and brother over the phone due to the patient being Confused and unable to answer questions The patient is a long time resident at [place] was given to speak with Thayer Headings or other staff at [employer] with information concerning the patient  The Brother and niece granted verbal permission to start the bed search process, fl2 and passr The patient has dementia and oriented to self only   Expected Discharge Plan: Skilled Nursing Facility Barriers to Discharge: Continued Medical Work up   Patient Goals and CMS Choice Patient states their goals for this hospitalization and ongoing recovery are:: family would like her to go to rehab      Expected Discharge Plan and Services Expected Discharge Plan: [place]   Discharge Planning Services: CM Consult   Living arrangements for the past 2 months: [place]                          Prior Living Arrangements/Services Living arrangements for the past 2 months: [place] Lives with:: Facility Resident Patient language and need for interpreter reviewed:: No Do you feel safe going back to the place where you live?: Yes      Need for Family Participation in Patient Care: Yes (Comment) Care giver support system in place?: Yes (comment)   Criminal Activity/Legal Involvement Pertinent to Current Situation/Hospitalization: No - Comment as needed  Activities of Daily Living Home Assistive Devices/Equipment: None ADL Screening (condition at time of admission) Patient's cognitive ability adequate to safely complete daily activities?: No Is the patient deaf or have  difficulty hearing?: No Does the patient have difficulty seeing, even when wearing glasses/contacts?: No Does the patient have difficulty concentrating, remembering, or making decisions?: Yes Patient able to express need for assistance with ADLs?: No Does the patient have difficulty dressing or bathing?: Yes Independently performs ADLs?: No Communication: Independent Dressing (OT): Needs assistance Is this a change from baseline?: Pre-admission baseline Grooming: Needs assistance Is this a change from baseline?: Pre-admission baseline Feeding: Independent Bathing: Needs assistance Is this a change from baseline?: Pre-admission baseline Toileting: Needs assistance Is this a change from baseline?: Pre-admission baseline In/Out Bed: Independent Walks in Home: Independent Does the patient have difficulty walking or climbing stairs?: Yes Weakness of Legs: Both Weakness of Arms/Hands: None  Permission Sought/Granted Permission sought to share information with : Case Manager, Chartered certified accountant granted to share information with : Yes, Verbal Permission Granted(niece and brother gave permission verbally)     Permission granted to share info w AGENCY: any SNF agency        Emotional Assessment Appearance:: Appears stated age Attitude/Demeanor/Rapport: Engaged Affect (typically observed): Accepting, Quiet(confused) Orientation: : Oriented to Self Alcohol / Substance Use: Never Used Psych Involvement: No (comment)  Admission diagnosis:  Fall [W19.XXXA] Fall, initial encounter [id number]] Closed fracture of right hip, initial encounter [facility]) [S72.001A] Patient Active Problem List   Diagnosis Date Noted  . COPD (chronic obstructive pulmonary disease) ([place]) 12/14/2018  . Closed right hip fracture ([place]) 12/14/2018  . Syncope 12/06/2016  . Iron deficiency  anemia due to chronic blood loss 11/27/2016  . Encounter for blood transfusion 10/16/2016  . Abnormal  findings on esophagogastroduodenoscopy (EGD) 10/16/2016  . Angiodysplasia of stomach and duodenum with bleeding 10/16/2016  . Essential thrombocytosis ([place]) 10/16/2016  . SVT (supraventricular tachycardia) ([place]) 10/16/2016  . Iron deficiency anemia 10/14/2016  . Thrombocythemia ([place]) 10/14/2016  . TIA (transient ischemic attack) 10/14/2016  . Encephalopathy acute 03/07/2016  . Pressure ulcer 02/23/2016  . Confusion 02/22/2016  . Agitation 01/20/2016  . Overactive bladder 02/06/2015  . Dementia ([place]) 02/06/2014  . Type 2 diabetes mellitus without complication ([place]) 19/47/1252  . Increased frequency of urination 01/07/2013   PCP:  Raelyn Number, MD Pharmacy:   [facility], [place] - [place] [address] [place] [address] Phone: [phone] Fax: [phone]     Social Determinants of Health (SDOH) Interventions    Readmission Risk Interventions No flowsheet data found.

## 2018-12-17 NOTE — Progress Notes (Signed)
[place] at [place] NAME: Savannah Young    MR#:  [id number]  DATE OF BIRTH:  November 12, 1931  SUBJECTIVE:   Status post right hip hemiarthroplasty postop day #2 today.  Working with PT this a.m. and more awake and follows some simple commands.   REVIEW OF SYSTEMS:    Review of Systems  Unable to perform ROS: Dementia    Nutrition: dysphagia I Tolerating Diet: Yes Tolerating PT: Eval noted.    DRUG ALLERGIES:  No Known Allergies  VITALS:  Blood pressure 101/63, pulse 82, temperature 98.1 F (36.7 C), temperature source Axillary, resp. rate 18, height 5' (1.524 m), weight 45.4 kg, last menstrual period 01/20/2016, SpO2 98 %.  PHYSICAL EXAMINATION:   Physical Exam  GENERAL:  85 y.o.-year-old thin demented patient lying in bed in no acute distress.  EYES: Pupils equal, round, reactive to light and accommodation. No scleral icterus. Extraocular muscles intact.  HEENT: Head atraumatic, normocephalic. Oropharynx and nasopharynx clear.  NECK:  Supple, no jugular venous distention. No thyroid enlargement, no tenderness.  LUNGS: Normal breath sounds bilaterally, no wheezing, rales, rhonchi. No use of accessory muscles of respiration.  CARDIOVASCULAR: S1, S2 normal. No murmurs, rubs, or gallops.  ABDOMEN: Soft, nontender, nondistended. Bowel sounds present. No organomegaly or mass.  EXTREMITIES: No cyanosis, clubbing or edema b/l. Right Hip dressing in place with no acute bleeding noted.  NEUROLOGIC: Cranial nerves II through XII are intact. No focal Motor or sensory deficits b/l.  Globally weak.    PSYCHIATRIC: The patient is alert and oriented x 1.  SKIN: No obvious rash, lesion, or ulcer.    LABORATORY PANEL:   CBC Recent Labs  Lab 12/17/18 0402  WBC 4.6  HGB 8.8*  HCT 28.3*  PLT 925*   ------------------------------------------------------------------------------------------------------------------  Chemistries  Recent Labs   Lab 12/17/18 0402  NA 147*  K 4.1  CL 119*  CO2 24  GLUCOSE 111*  BUN 15  CREATININE 0.75  CALCIUM 7.4*   ------------------------------------------------------------------------------------------------------------------  Cardiac Enzymes Recent Labs  Lab 12/14/18 2020  TROPONINI <0.03   ------------------------------------------------------------------------------------------------------------------  RADIOLOGY:  Dg Hip Port Unilat With Pelvis 1v Right  Result Date: 12/15/2018 CLINICAL DATA:  85 year old female status post right hip hemiarthroplasty following femoral neck fracture. EXAM: DG HIP (WITH OR WITHOUT PELVIS) 1V PORT RIGHT COMPARISON:  12/14/2018. FINDINGS: AP and cross-table lateral views. Proximal right femur arthroplasty with normally located femoral head component in the right acetabulum. Hardware appears intact. Surrounding postoperative changes to the soft tissues including skin staples in place. No other acute osseous abnormality identified. IMPRESSION: Proximal right femur arthroplasty with no adverse features. Electronically Signed   By: Genevie Ann M.D.   On: 12/15/2018 18:45     ASSESSMENT AND PLAN:   85 year old female with past medical history of essential thrombocytosis, dementia, hypertension, COPD, diabetes who presented to the hospital after a mechanical fall and noted to have a right hip fracture.  1.  Status post fall and right hip fracture- this post right hip hemiarthroplasty.  Postop day #2 today. -Continue pain control and further care as per orthopedics. -Appreciate physical therapy evaluation and the patient will need short-term rehab upon discharge.  2.  Essential thrombocytosis-patient's platelet count continues to be elevated. -Continue hydroxyurea.  3. Anemia - likely acute blood loss anemia from recent surgery.  - follow Hg. And no acute need for transfusion.   4.  Essential hypertension-continue metoprolol.  5.  GERD-continue  Protonix.  6. Dementia-continue Namenda, PRN Ativan and Seroquel.  Patient high risk for sundowning.  Possible d/c to SNF/STR in 1-2 days  All the records are reviewed and case discussed with Care Management/Social Worker. Management plans discussed with the patient, family and they are in agreement.  CODE STATUS: Full code  DVT Prophylaxis: Lovenox   TOTAL TIME TAKING CARE OF THIS PATIENT: 25 minutes.   POSSIBLE D/C IN 1-2 DAYS, DEPENDING ON CLINICAL CONDITION.   Henreitta Leber M.D on 12/17/2018 at 1:24 PM  Between 7am to 6pm - Pager - [phone]  After 6pm go to [email or web address] - Proofreader  [facility]  Office  [phone]  CC: Primary care physician; Raelyn Number, MD

## 2018-12-17 NOTE — TOC Progression Note (Signed)
Transition of Care [facility]) - Progression Note    Patient Details  Name: Savannah Young MRN: [id number] Date of Birth: 1932/01/07  Transition of Care [facility]) CM/SW Contact  Su Hilt, RN Phone Number: 12/17/2018, 3:34 PM  Clinical Narrative:    Clancy Gourd, the director at [facility] called to check on the status of the patient who has been a long term resident at [facility], she requested a call when the patient is Discharged with an updatem [phone]        Expected Discharge Plan and Services                                     Social Determinants of Health (SDOH) Interventions    Readmission Risk Interventions No flowsheet data found.

## 2018-12-17 NOTE — Progress Notes (Signed)
Physical Therapy Treatment Patient Details Name: Savannah Young MRN: [id number] DOB: [birth year] Today's Date: 12/17/2018    History of Present Illness Patient was admitted s/p R hip fracture with fixation with hemiarthroplasty (12/15/2018). PMH: Essential thrombocytosis, advanced dementia, DM2, COPD    PT Comments    Patient supine in bed and resting peacefully on PT arrival, and was able to be aroused and verbally agreed to participate in therapy. Patient continues to require constant redirection to task, increased processing time for one-step commands, and participates in a limited fashion 2/2 to cognitive decline. Patient performed supine bed exercises with PT assist for heel slides and limited hip ab/adduction. PT attempted to get patient EOB, but patient became drowsy and attention was further limited. Of note, patient was much more agreeable to activity and had much more verbal expression during today's session. At one point, she reported "I want to do it all." However, she quickly became drowsy and and declined further movement attempts. She will continue to benefit from skilled therapeutic intervention to address deficits in strength, mobility, balance, safety, and DME use for improved overall QOL and function.  Patient supine in bed with bed alarm set, SCD's applied, abduction wedge in place, call bell in reach; all needs met.   Follow Up Recommendations  SNF     Equipment Recommendations  Other (comment)(TBD at next venue of care)    Recommendations for Other Services       Precautions / Restrictions Precautions Precautions: Fall Restrictions Weight Bearing Restrictions: Yes RLE Weight Bearing: Weight bearing as tolerated Other Position/Activity Restrictions: RLE; s/p R hip fx    Mobility  Bed Mobility Overal bed mobility: Needs Assistance Bed Mobility: Supine to Sit;Sit to Supine     Supine to sit: Max assist Sit to supine: Max assist   General bed mobility  comments: Patient limited 2/2 to cognition and fatigue.  Transfers Overall transfer level: Needs assistance               General transfer comment: Unable to assess at this time 2/2 cognition/refusal to participate in therapy.  Ambulation/Gait             General Gait Details: Unable to assess at this time 2/2 cognition/refusal to participate in therapy.   Stairs             Wheelchair Mobility    Modified Rankin (Stroke Patients Only)       Balance Overall balance assessment: Needs assistance Sitting-balance support: Feet supported;Bilateral upper extremity supported Sitting balance-Leahy Scale: Zero Sitting balance - Comments: Patient's sitting balance assessment likely impacted by her cognition and unwillingness to participate in therapy Postural control: Posterior lean;Right lateral lean     Standing balance comment: Unable to assess at this time 2/2 cognition and fatigue.                            Cognition                                              Exercises Other Exercises Other Exercises: supine BLE exercises: heel slides AAROM x10, ankle pumps x10, hip ab/adduction x5 (AAROM)    General Comments        Pertinent Vitals/Pain Pain Assessment: Faces Faces Pain Scale: Hurts a little bit Pain Location: R hip Pain Intervention(s): Limited  activity within patient's tolerance;Monitored during session;Repositioned    Home Living                      Prior Function            PT Goals (current goals can now be found in the care plan section) Acute Rehab PT Goals PT Goal Formulation: Patient unable to participate in goal setting    Frequency    7X/week      PT Plan Current plan remains appropriate    Co-evaluation              AM-PAC PT "6 Clicks" Mobility   Outcome Measure  Help needed turning from your back to your side while in a flat bed without using bedrails?: Total Help  needed moving from lying on your back to sitting on the side of a flat bed without using bedrails?: Total Help needed moving to and from a bed to a chair (including a wheelchair)?: Total Help needed standing up from a chair using your arms (e.g., wheelchair or bedside chair)?: Total Help needed to walk in hospital room?: Total Help needed climbing 3-5 steps with a railing? : Total 6 Click Score: 6    End of Session Equipment Utilized During Treatment: Gait belt;Oxygen Activity Tolerance: Patient limited by pain;Other (comment);Patient limited by fatigue(Treatment limited secondary to advanced dementia) Patient left: in bed;with bed alarm set;with SCD's reapplied Nurse Communication: Mobility status PT Visit Diagnosis: Muscle weakness (generalized) (M62.81);Difficulty in walking, not elsewhere classified (R26.2);History of falling (Z91.81);Pain Pain - Right/Left: Right Pain - part of body: Hip     Time: 5732-2567 PT Time Calculation (min) (ACUTE ONLY): 25 min  Charges:  $Therapeutic Exercise: 23-37 mins                     Myles Gip PT, DPT [phone] 12/17/2018, 11:20 AM

## 2018-12-17 NOTE — Progress Notes (Signed)
  Subjective:  POD #2 s/p right hip hemiarthroplasty.   Patient with advanced dementia and cannot provide an accurate history.  Patient denies any significant pain in the right hip.  Objective:   VITALS:   Vitals:   12/16/18 0828 12/16/18 1606 12/16/18 1956 12/17/18 0341  BP: (!) 126/55 125/76 135/69 101/63  Pulse: 100 85 98 82  Resp: 20 16  18   Temp: (!) 97.5 F (36.4 C) 99 F (37.2 C) (!) 100.5 F (38.1 C) 98.1 F (36.7 C)  TempSrc: Oral Oral Axillary Axillary  SpO2: 93% 100% 96% 98%  Weight:      Height:        PHYSICAL EXAM: Right lower extremity Neurovascular intact Intact pulses distally Dorsiflexion/Plantar flexion intact Incision: dressing C/D/I No cellulitis present Compartment soft  LABS  Results for orders placed or performed during the hospital encounter of 12/14/18 (from the past 24 hour(s))  CBC     Status: Abnormal   Collection Time: 12/17/18  4:02 AM  Result Value Ref Range   WBC 4.6 4.0 - 10.5 K/uL   RBC 3.01 (L) 3.87 - 5.11 MIL/uL   Hemoglobin 8.8 (L) 12.0 - 15.0 g/dL   HCT 28.3 (L) 36.0 - 46.0 %   MCV 94.0 80.0 - 100.0 fL   MCH 29.2 26.0 - 34.0 pg   MCHC 31.1 30.0 - 36.0 g/dL   RDW 15.9 (H) 11.5 - 15.5 %   Platelets 925 (HH) 150 - 400 K/uL   nRBC 0.0 0.0 - 0.2 %  Basic metabolic panel     Status: Abnormal   Collection Time: 12/17/18  4:02 AM  Result Value Ref Range   Sodium 147 (H) 135 - 145 mmol/L   Potassium 4.1 3.5 - 5.1 mmol/L   Chloride 119 (H) 98 - 111 mmol/L   CO2 24 22 - 32 mmol/L   Glucose, Bld 111 (H) 70 - 99 mg/dL   BUN 15 8 - 23 mg/dL   Creatinine, Ser 0.75 0.44 - 1.00 mg/dL   Calcium 7.4 (L) 8.9 - 10.3 mg/dL   GFR calc non Af Amer >60 >60 mL/min   GFR calc Af Amer >60 >60 mL/min   Anion gap 4 (L) 5 - 15    Dg Hip Port Unilat With Pelvis 1v Right  Result Date: 12/15/2018 CLINICAL DATA:  85 year old female status post right hip hemiarthroplasty following femoral neck fracture. EXAM: DG HIP (WITH OR WITHOUT PELVIS) 1V PORT  RIGHT COMPARISON:  12/14/2018. FINDINGS: AP and cross-table lateral views. Proximal right femur arthroplasty with normally located femoral head component in the right acetabulum. Hardware appears intact. Surrounding postoperative changes to the soft tissues including skin staples in place. No other acute osseous abnormality identified. IMPRESSION: Proximal right femur arthroplasty with no adverse features. Electronically Signed   By: Genevie Ann M.D.   On: 12/15/2018 18:45    Assessment/Plan: 2 Days Post-Op   Principal Problem:   Closed right hip fracture (HCC) Active Problems:   Essential thrombocytosis (HCC)   Dementia (HCC)   Type 2 diabetes mellitus without complication ([place])   COPD (chronic obstructive pulmonary disease) ([place])  Patient is stable postop.  Her hemoglobin hematocrit are within acceptable limits.  Continue physical therapy as the patient can tolerate.  Patient would benefit from a skilled nursing facility upon discharge.  Continue Lovenox for DVT prophylaxis.    Thornton Park , MD 12/17/2018, 1:39 PM

## 2018-12-17 NOTE — NC FL2 (Signed)
[place] LEVEL OF CARE SCREENING TOOL     IDENTIFICATION  Patient Name: Savannah Young Birthdate: [birth year] Sex: female Admission Date (Current Location): 12/14/2018  [place] and [place] Number:  Engineering geologist and Address:  [facility], [address], [place], [place] [address]      Provider Number: [id number]  Attending Physician Name and Address:  Henreitta Leber, MD  Relative Name and Phone Number:  Mardene Celeste [phone] niece    Current Level of Care: Hospital Recommended Level of Care: [place] Prior Approval Number:    Date Approved/Denied: 09/28/11 PASRR Number: [id number]  Discharge Plan: SNF    Current Diagnoses: Patient Active Problem List   Diagnosis Date Noted  . COPD (chronic obstructive pulmonary disease) ([place]) 12/14/2018  . Closed right hip fracture ([place]) 12/14/2018  . Syncope 12/06/2016  . Iron deficiency anemia due to chronic blood loss 11/27/2016  . Encounter for blood transfusion 10/16/2016  . Abnormal findings on esophagogastroduodenoscopy (EGD) 10/16/2016  . Angiodysplasia of stomach and duodenum with bleeding 10/16/2016  . Essential thrombocytosis ([place]) 10/16/2016  . SVT (supraventricular tachycardia) ([place]) 10/16/2016  . Iron deficiency anemia 10/14/2016  . Thrombocythemia ([place]) 10/14/2016  . TIA (transient ischemic attack) 10/14/2016  . Encephalopathy acute 03/07/2016  . Pressure ulcer 02/23/2016  . Confusion 02/22/2016  . Agitation 01/20/2016  . Overactive bladder 02/06/2015  . Dementia ([place]) 02/06/2014  . Type 2 diabetes mellitus without complication ([place]) 29/47/6546  . Increased frequency of urination 01/07/2013    Orientation RESPIRATION BLADDER Height & Weight     Self  Normal Continent Weight: 45.4 kg Height:  5' (152.4 cm)  BEHAVIORAL SYMPTOMS/MOOD NEUROLOGICAL BOWEL NUTRITION STATUS  (confused)   Continent Diet(regular)  AMBULATORY STATUS COMMUNICATION OF  NEEDS Skin   Extensive Assist Verbally Normal, Surgical wounds                       Personal Care Assistance Level of Assistance  Total care       Total Care Assistance: Limited assistance   Functional Limitations Info  Sight, Hearing, Speech Sight Info: Adequate Hearing Info: Adequate Speech Info: Adequate    SPECIAL CARE FACTORS FREQUENCY  PT (By licensed PT)     PT Frequency: 5 times a week              Contractures Contractures Info: Not present    Additional Factors Info  Code Status, Allergies Code Status Info: full Allergies Info: no known allergies           Current Medications (12/17/2018):  This is the current hospital active medication list Current Facility-Administered Medications  Medication Dose Route Frequency Provider Last Rate Last Dose  . 0.9 % NaCl with KCl 20 mEq/ L  infusion   Intravenous Continuous Thornton Park, MD 75 mL/hr at 12/17/18 1452    . acetaminophen (TYLENOL) tablet 325-650 mg  325-650 mg Oral Q6H PRN Thornton Park, MD   650 mg at 12/16/18 2111  . alum & mag hydroxide-simeth (MAALOX/MYLANTA) 200-200-20 MG/5ML suspension 30 mL  30 mL Oral Q4H PRN Thornton Park, MD      . aspirin chewable tablet 81 mg  81 mg Oral Daily Thornton Park, MD   81 mg at 12/17/18 5035  . atorvastatin (LIPITOR) tablet 20 mg  20 mg Oral Daily Thornton Park, MD   20 mg at 12/17/18 4656  . bisacodyl (DULCOLAX) suppository 10 mg  10 mg Rectal Daily PRN Mack Guise,  Lennette Bihari, MD      . docusate sodium (COLACE) capsule 100 mg  100 mg Oral BID Thornton Park, MD   Stopped at 12/17/18 [phone]  . enoxaparin (LOVENOX) injection 30 mg  30 mg Subcutaneous Q12H Orene Desanctis, RPH   30 mg at 12/17/18 0919  . ferrous sulfate tablet 325 mg  325 mg Oral TID Lorre Munroe, MD   325 mg at 12/17/18 1448  . HYDROcodone-acetaminophen (NORCO) 7.5-325 MG per tablet 1-2 tablet  1-2 tablet Oral Q4H PRN Thornton Park, MD      . HYDROcodone-acetaminophen  (NORCO/VICODIN) 5-325 MG per tablet 1-2 tablet  1-2 tablet Oral Q4H PRN Thornton Park, MD      . hydroxyurea (HYDREA) capsule 500 mg  500 mg Oral Daily Thornton Park, MD   500 mg at 12/17/18 5397  . loratadine (CLARITIN) tablet 10 mg  10 mg Oral Daily Thornton Park, MD   10 mg at 12/17/18 6734  . LORazepam (ATIVAN) tablet 0.25 mg  0.25 mg Oral Q6H PRN Thornton Park, MD      . LORazepam (ATIVAN) tablet 0.5 mg  0.5 mg Oral Q1500 Thornton Park, MD   0.5 mg at 12/17/18 1448  . magnesium citrate solution 1 Bottle  1 Bottle Oral Once PRN Thornton Park, MD      . Melatonin TABS 10 mg  10 mg Oral QHS Thornton Park, MD   10 mg at 12/16/18 2113  . memantine (NAMENDA XR) 24 hr capsule 28 mg  28 mg Oral QHS Thornton Park, MD   28 mg at 12/16/18 2113  . menthol-cetylpyridinium (CEPACOL) lozenge 3 mg  1 lozenge Oral PRN Thornton Park, MD       Or  . phenol (CHLORASEPTIC) mouth spray 1 spray  1 spray Mouth/Throat PRN Thornton Park, MD      . methocarbamol (ROBAXIN) tablet 500 mg  500 mg Oral Q6H PRN Thornton Park, MD       Or  . methocarbamol (ROBAXIN) 500 mg in dextrose 5 % 50 mL IVPB  500 mg Intravenous Q6H PRN Thornton Park, MD      . metoprolol tartrate (LOPRESSOR) tablet 25 mg  25 mg Oral BID Thornton Park, MD   25 mg at 12/16/18 2111  . mirtazapine (REMERON) tablet 7.5 mg  7.5 mg Oral QHS Thornton Park, MD   7.5 mg at 12/16/18 2111  . morphine 2 MG/ML injection 0.5-1 mg  0.5-1 mg Intravenous Q2H PRN Thornton Park, MD      . ondansetron [facility]) tablet 4 mg  4 mg Oral Q6H PRN Thornton Park, MD       Or  . ondansetron [facility]) injection 4 mg  4 mg Intravenous Q6H PRN Thornton Park, MD      . pantoprazole (PROTONIX) EC tablet 40 mg  40 mg Oral Daily Thornton Park, MD   40 mg at 12/17/18 1937  . polyethylene glycol (MIRALAX / GLYCOLAX) packet 17 g  17 g Oral Daily PRN Thornton Park, MD      . QUEtiapine (SEROQUEL) tablet 12.5 mg  12.5 mg Oral QAC  supper Thornton Park, MD      . traMADol [facility]) tablet 50 mg  50 mg Oral Q6H Thornton Park, MD   50 mg at 12/17/18 1448  . traZODone (DESYREL) tablet 25 mg  25 mg Oral Q4H PRN Thornton Park, MD         Discharge Medications: Please see discharge summary for a list of discharge medications.  Relevant  Imaging Results:  Relevant Lab Results:   Additional Information [id number]  Su Hilt, RN

## 2018-12-18 LAB — CBC
HCT: 28.8 % — ABNORMAL LOW (ref 36.0–46.0)
Hemoglobin: 8.8 g/dL — ABNORMAL LOW (ref 12.0–15.0)
MCH: 28.9 pg (ref 26.0–34.0)
MCHC: 30.6 g/dL (ref 30.0–36.0)
MCV: 94.7 fL (ref 80.0–100.0)
Platelets: 995 10*3/uL (ref 150–400)
RBC: 3.04 MIL/uL — ABNORMAL LOW (ref 3.87–5.11)
RDW: 16.1 % — ABNORMAL HIGH (ref 11.5–15.5)
WBC: 6.3 10*3/uL (ref 4.0–10.5)
nRBC: 0 % (ref 0.0–0.2)

## 2018-12-18 LAB — SURGICAL PATHOLOGY

## 2018-12-18 MED ORDER — TRAMADOL HCL 50 MG PO TABS: 50.0000 mg | ORAL_TABLET | Freq: Four times a day (QID) | ORAL | 0 refills | Status: DC | PRN

## 2018-12-18 MED ORDER — ENOXAPARIN SODIUM 30 MG/0.3ML ~~LOC~~ SOLN: 30.0000 mg | Freq: Two times a day (BID) | SUBCUTANEOUS | Status: DC

## 2018-12-18 MED ORDER — LACTULOSE 10 GM/15ML PO SOLN
10.0000 g | Freq: Every day | ORAL | Status: DC
  Administered 2018-12-18: 10:00:00 10 g via ORAL

## 2018-12-18 MED ORDER — POLYETHYLENE GLYCOL 3350 17 G PO PACK: 17.0000 g | PACK | Freq: Every day | ORAL | 0 refills | Status: DC | PRN

## 2018-12-18 NOTE — Progress Notes (Signed)
Spoke with Journalist, newspaper and educated her that she can remove the patient's extended dwell PIV access.

## 2018-12-18 NOTE — Progress Notes (Signed)
Midline catheter removed from pt's RFA intact.

## 2018-12-18 NOTE — Progress Notes (Signed)
Care of patient taken over from [place], [place].  Patient awaiting transport to [facility] via EMS.  Clarise Cruz, BSN

## 2018-12-18 NOTE — Progress Notes (Signed)
ANTICOAGULATION CONSULT NOTE - Initial Consult  Pharmacy Consult for Lovenox  Indication: VTE prophylaxis  No Known Allergies  Patient Measurements: Height: 5' (152.4 cm) Weight: 100 lb 1.4 oz (45.4 kg) IBW/kg (Calculated) : 45.5 Heparin Dosing Weight:   Vital Signs: Temp: 98.7 F (37.1 C) (04/21 0423) Temp Source: Oral (04/21 0423) BP: 128/95 (04/21 0423) Pulse Rate: 103 (04/21 0423)  Labs: Recent Labs    12/16/18 0509 12/17/18 0402 12/18/18 0434  HGB 10.5* 8.8* 8.8*  HCT 33.3* 28.3* 28.8*  PLT 1,058* 925* 995*  CREATININE 0.92 0.75  --     Estimated Creatinine Clearance: 36.8 mL/min (by C-G formula based on SCr of 0.75 mg/dL).   Medical History: Past Medical History:  Diagnosis Date  . COPD (chronic obstructive pulmonary disease) ([place])   . Dementia ([place])   . Diabetes mellitus ([place])   . Hypertension     Medications:  Medications Prior to Admission  Medication Sig Dispense Refill Last Dose  . aspirin 81 MG chewable tablet Chew 81 mg by mouth daily.   12/14/2018 at Unknown time  . atorvastatin (LIPITOR) 20 MG tablet Take 1 tablet (20 mg total) by mouth daily. 30 tablet 0 12/14/2018 at Unknown time  . docusate sodium (COLACE) 100 MG capsule Take 1 capsule (100 mg total) by mouth 2 (two) times daily as needed for mild constipation. (Patient taking differently: Take 100 mg by mouth 2 (two) times daily. ) 10 capsule 0 12/14/2018 at Unknown time  . ferrous gluconate (FERGON) 240 (27 FE) MG tablet Take 240 mg by mouth daily with breakfast.    12/14/2018 at Unknown time  . fluticasone (FLONASE) 50 MCG/ACT nasal spray Place 2 sprays into both nostrils daily.   12/14/2018 at Unknown time  . hydroxyurea (HYDREA) 500 MG capsule Take 1 capsule (500 mg total) by mouth daily. May take with food to minimize GI side effects. 30 capsule 3 12/14/2018 at Unknown time  . loratadine (CLARITIN) 10 MG tablet Take 10 mg by mouth daily.   12/14/2018 at Unknown time  . LORazepam (ATIVAN) 0.5 MG  tablet Take 0.5 mg by mouth daily at 3 pm.    12/14/2018 at Unknown time  . LORazepam (ATIVAN) 0.5 MG tablet Take 0.25 mg by mouth every 6 (six) hours as needed for anxiety.   prn at prn  . Melatonin 3 MG TABS Take 3 mg by mouth at bedtime.    Past Week at Unknown time  . memantine (NAMENDA XR) 28 MG CP24 24 hr capsule Take 28 mg by mouth at bedtime.   Past Week at Unknown time  . metoprolol tartrate (LOPRESSOR) 25 MG tablet Take 1 tablet (25 mg total) by mouth 2 (two) times daily. 60 tablet 6 12/14/2018 at 1745  . mirtazapine (REMERON) 15 MG tablet Take 7.5 mg by mouth at bedtime.    Past Week at Unknown time  . pantoprazole (PROTONIX) 40 MG tablet Take 1 tablet (40 mg total) by mouth daily. 30 tablet 6 12/14/2018 at Unknown time  . QUEtiapine (SEROQUEL) 25 MG tablet Take 0.5 tablets (12.5 mg total) by mouth daily before supper. (Patient taking differently: Take 12.5 mg by mouth daily. ) 15 tablet 6 12/14/2018 at Unknown time  . QUEtiapine (SEROQUEL) 25 MG tablet Take 25 mg by mouth at bedtime.   Past Week at Unknown time  . traZODone (DESYREL) 50 MG tablet Take 25 mg by mouth every 4 (four) hours as needed (anxiety).    prn at prn  Assessment: Pharmacy consulted to dose lovenox for VTE prophylaxis of s/p surgical repair of hip fracture. Patient's current platelet level is 995 k/uL. Hgb has remained steady.  Goal of Therapy:  Prevention of venous thromboembolism   Plan:  Lovenox 30 mg SQ Q24H to start 4/19 @ 0800 originally ordered.  Will continue current dose of lovenox 30 mg SQ Q12H.   Kaidance Pantoja A Posey Jasmin 12/18/2018,8:55 AM

## 2018-12-18 NOTE — Care Management Important Message (Signed)
Important Message  Patient Details  Name: Savannah Young MRN: [id number] Date of Birth: July 18, 1932   Medicare Important Message Given:  Yes  Obtained phone number for the niece who RN CM has been talking with regarding her care. I reviewed the Important Message from Medicare with [name] [phone]) and stated she understood the information and was in agreement with the discharge plan to [facility].  I offered her the phone number to [place]/copy of form but she declined it as she was satisfied with discharge plan.  Juliann Pulse A Freman Lapage 12/18/2018, 11:20 AM

## 2018-12-18 NOTE — Progress Notes (Signed)
  Subjective:  POD #3 s/p right hip hemiarthroplasty.   Patient has advanced dementia.  She denies significant right hip pain.  Objective:   VITALS:   Vitals:   12/17/18 1600 12/17/18 2035 12/18/18 0423 12/18/18 1000  BP: 130/72 127/67 (!) 128/95   Pulse: 65 89 (!) 103   Resp: 20 18 19    Temp: 97.7 F (36.5 C) 98.4 F (36.9 C) 98.7 F (37.1 C)   TempSrc: Oral Oral Oral   SpO2: 92% 99% 100% 98%  Weight:      Height:        PHYSICAL EXAM: Right lower extremity: I personally changed the patient's dressing today. Neurovascular intact Sensation intact distally Intact pulses distally Dorsiflexion/Plantar flexion intact Incision: no drainage No cellulitis present Compartment soft  LABS  Results for orders placed or performed during the hospital encounter of 12/14/18 (from the past 24 hour(s))  CBC     Status: Abnormal   Collection Time: 12/18/18  4:34 AM  Result Value Ref Range   WBC 6.3 4.0 - 10.5 K/uL   RBC 3.04 (L) 3.87 - 5.11 MIL/uL   Hemoglobin 8.8 (L) 12.0 - 15.0 g/dL   HCT 28.8 (L) 36.0 - 46.0 %   MCV 94.7 80.0 - 100.0 fL   MCH 28.9 26.0 - 34.0 pg   MCHC 30.6 30.0 - 36.0 g/dL   RDW 16.1 (H) 11.5 - 15.5 %   Platelets 995 (HH) 150 - 400 K/uL   nRBC 0.0 0.0 - 0.2 %    No results found.  Assessment/Plan: 3 Days Post-Op   Principal Problem:   Closed right hip fracture (HCC) Active Problems:   Essential thrombocytosis (HCC)   Dementia (HCC)   Type 2 diabetes mellitus without complication ([place])   COPD (chronic obstructive pulmonary disease) ([place])  Patient doing well from an orthopaedic standpoint.   Being discharged to SNF today.  Follow up with me in 10-14 days in the office.   Patient will continue lovenox for DVT prophylaxis.  Patient is WBAT on right lower extremity, but must observe posterior hip precautions.    Thornton Park , MD 12/18/2018, 4:32 PM

## 2018-12-18 NOTE — Discharge Summary (Signed)
[place] [facility] [place] NAME: Savannah Young    MR#:  [id number]  DATE OF BIRTH:  1932/05/29  DATE OF ADMISSION:  12/14/2018 ADMITTING PHYSICIAN: Lance Coon, MD  DATE OF DISCHARGE: 12/18/2018  PRIMARY CARE PHYSICIAN: Raelyn Number, MD    ADMISSION DIAGNOSIS:  Fall [W19.XXXA] Fall, initial encounter [id number]] Closed fracture of right hip, initial encounter ([place]) [S72.001A]  DISCHARGE DIAGNOSIS:  Principal Problem:   Closed right hip fracture (HCC) Active Problems:   Essential thrombocytosis (HCC)   Dementia (HCC)   Type 2 diabetes mellitus without complication (HCC)   COPD (chronic obstructive pulmonary disease) ([place])   SECONDARY DIAGNOSIS:   Past Medical History:  Diagnosis Date  . COPD (chronic obstructive pulmonary disease) ([place])   . Dementia ([place])   . Diabetes mellitus ([place])   . Hypertension     HOSPITAL COURSE:   85 year old female with past medical history of essential thrombocytosis, dementia, hypertension, COPD, diabetes who presented to the hospital after a mechanical fall and noted to have a right hip fracture.  1.  Status post fall and right hip fracture-  patient was admitted to the hospital seen by orthopedics and underwent right hip hemiarthroplasty.  Patient is postop day #3 today.  She is working with physical therapy well, her pain is well controlled on oral tramadol. -She will be discharged to a skilled nursing facility for ongoing rehab.  We will continue Lovenox for DVT prophylaxis. -Follow-up with orthopedics in next 1 to 2 weeks.    2.  Essential thrombocytosis- plt count is stable.  -Continue hydroxyurea. Follow up with outpatient Hem/Onc.   3. Anemia - likely acute blood loss anemia from recent surgery.  Hemoglobin is currently stable and patient did not require any transfusion.  Patient will be discharge to a skilled nursing facility.   4.  Essential hypertension- pt. Will continue  metoprolol.  5.  GERD- pt. Will continue Protonix.  6. Dementia-pt. Will continue Namenda, PRN Ativan and Seroquel.    Stable for discharge to SNF today.   DISCHARGE CONDITIONS:   Stable.   CONSULTS OBTAINED:  Treatment Team:  Thornton Park, MD  DRUG ALLERGIES:  No Known Allergies  DISCHARGE MEDICATIONS:   Allergies as of 12/18/2018   No Known Allergies     Medication List    TAKE these medications   aspirin 81 MG chewable tablet Chew 81 mg by mouth daily.   atorvastatin 20 MG tablet Commonly known as:  Lipitor Take 1 tablet (20 mg total) by mouth daily.   docusate sodium 100 MG capsule Commonly known as:  COLACE Take 1 capsule (100 mg total) by mouth 2 (two) times daily as needed for mild constipation. What changed:  when to take this   enoxaparin 30 MG/0.3ML injection Commonly known as:  LOVENOX Inject 0.3 mLs (30 mg total) into the skin every 12 (twelve) hours for 14 days.   ferrous gluconate 240 (27 FE) MG tablet Commonly known as:  FERGON Take 240 mg by mouth daily with breakfast.   fluticasone 50 MCG/ACT nasal spray Commonly known as:  FLONASE Place 2 sprays into both nostrils daily.   hydroxyurea 500 MG capsule Commonly known as:  HYDREA Take 1 capsule (500 mg total) by mouth daily. May take with food to minimize GI side effects.   loratadine 10 MG tablet Commonly known as:  CLARITIN Take 10 mg by mouth daily.   LORazepam 0.5 MG tablet Commonly known as:  ATIVAN Take  0.5 mg by mouth daily at 3 pm.   LORazepam 0.5 MG tablet Commonly known as:  ATIVAN Take 0.25 mg by mouth every 6 (six) hours as needed for anxiety.   Melatonin 3 MG Tabs Take 3 mg by mouth at bedtime.   metoprolol tartrate 25 MG tablet Commonly known as:  LOPRESSOR Take 1 tablet (25 mg total) by mouth 2 (two) times daily.   mirtazapine 15 MG tablet Commonly known as:  REMERON Take 7.5 mg by mouth at bedtime.   Namenda XR 28 MG Cp24 24 hr capsule Generic drug:   memantine Take 28 mg by mouth at bedtime.   pantoprazole 40 MG tablet Commonly known as:  PROTONIX Take 1 tablet (40 mg total) by mouth daily.   polyethylene glycol 17 g packet Commonly known as:  MIRALAX / GLYCOLAX Take 17 g by mouth daily as needed for mild constipation.   QUEtiapine 25 MG tablet Commonly known as:  SEROQUEL Take 25 mg by mouth at bedtime. What changed:  Another medication with the same name was changed. Make sure you understand how and when to take each.   QUEtiapine 25 MG tablet Commonly known as:  SEROQUEL Take 0.5 tablets (12.5 mg total) by mouth daily before supper. What changed:  when to take this   traMADol 50 MG tablet Commonly known as:  ULTRAM Take 1 tablet (50 mg total) by mouth every 6 (six) hours as needed.   traZODone 50 MG tablet Commonly known as:  DESYREL Take 25 mg by mouth every 4 (four) hours as needed (anxiety).         DISCHARGE INSTRUCTIONS:   DIET:  Cardiac diet  DISCHARGE CONDITION:  Stable  ACTIVITY:  Activity as tolerated  OXYGEN:  Home Oxygen: No.   Oxygen Delivery: room air  DISCHARGE LOCATION:  nursing home   If you experience worsening of your admission symptoms, develop shortness of breath, life threatening emergency, suicidal or homicidal thoughts you must seek medical attention immediately by calling 911 or calling your MD immediately  if symptoms less severe.  You Must read complete instructions/literature along with all the possible adverse reactions/side effects for all the Medicines you take and that have been prescribed to you. Take any new Medicines after you have completely understood and accpet all the possible adverse reactions/side effects.   Please note  You were cared for by a hospitalist during your hospital stay. If you have any questions about your discharge medications or the care you received while you were in the hospital after you are discharged, you can call the unit and asked to speak  with the hospitalist on call if the hospitalist that took care of you is not available. Once you are discharged, your primary care physician will handle any further medical issues. Please note that NO REFILLS for any discharge medications will be authorized once you are discharged, as it is imperative that you return to your primary care physician (or establish a relationship with a primary care physician if you do not have one) for your aftercare needs so that they can reassess your need for medications and monitor your lab values.     Today   No acute events overnight, tolerating physical therapy well.  Pain is well controlled on oral pain meds.  Will discharge to short-term rehab for ongoing physical therapy.  VITAL SIGNS:  Blood pressure (!) 128/95, pulse (!) 103, temperature 98.7 F (37.1 C), temperature source Oral, resp. rate 19, height 5' (1.524 m),  weight 45.4 kg, last menstrual period 01/20/2016, SpO2 100 %.  I/O:    Intake/Output Summary (Last 24 hours) at 12/18/2018 1034 Last data filed at 12/18/2018 1000 Gross per 24 hour  Intake 3095.81 ml  Output 600 ml  Net 2495.81 ml    PHYSICAL EXAMINATION:   GENERAL:  85 y.o.-year-old thin demented patient lying in bed in no acute distress.  EYES: Pupils equal, round, reactive to light and accommodation. No scleral icterus. Extraocular muscles intact.  HEENT: Head atraumatic, normocephalic. Oropharynx and nasopharynx clear.  NECK:  Supple, no jugular venous distention. No thyroid enlargement, no tenderness.  LUNGS: Normal breath sounds bilaterally, no wheezing, rales, rhonchi. No use of accessory muscles of respiration.  CARDIOVASCULAR: S1, S2 normal. No murmurs, rubs, or gallops.  ABDOMEN: Soft, nontender, nondistended. Bowel sounds present. No organomegaly or mass.  EXTREMITIES: No cyanosis, clubbing or edema b/l. Right Hip dressing in place with no acute bleeding noted.  NEUROLOGIC: Cranial nerves II through XII are intact. No  focal Motor or sensory deficits b/l.  Globally weak.    PSYCHIATRIC: The patient is alert and oriented x 1.  SKIN: No obvious rash, lesion, or ulcer.   DATA REVIEW:   CBC Recent Labs  Lab 12/18/18 0434  WBC 6.3  HGB 8.8*  HCT 28.8*  PLT 995*    Chemistries  Recent Labs  Lab 12/17/18 0402  NA 147*  K 4.1  CL 119*  CO2 24  GLUCOSE 111*  BUN 15  CREATININE 0.75  CALCIUM 7.4*    Cardiac Enzymes Recent Labs  Lab 12/14/18 2020  TROPONINI <0.03     RADIOLOGY:  No results found.    Management plans discussed with the patient, family and they are in agreement.  CODE STATUS:     Code Status Orders  (From admission, onward)         Start     Ordered   12/15/18 0046  Full code  Continuous     12/15/18 0045       TOTAL TIME TAKING CARE OF THIS PATIENT: 40 minutes.    Henreitta Leber M.D on 12/18/2018 at 10:34 AM  Between 7am to 6pm - Pager - [phone]  After 6pm go to [email or web address] - Proofreader  [facility]  Office  [phone]  CC: Primary care physician; Raelyn Number, MD

## 2018-12-18 NOTE — Progress Notes (Signed)
Physical Therapy Treatment Patient Details Name: Savannah Young MRN: [id number] DOB: [birth year] Today's Date: 12/18/2018    History of Present Illness Patient was admitted s/p R hip fracture with fixation with hemiarthroplasty (12/15/2018). PMH: Essential thrombocytosis, advanced dementia, DM2, COPD    PT Comments    Patient in bed and awake on PT arrival. Patient verbalized willingness to participate in therapy but playfully rolled her eyes and laughed at the notion of sitting EOB. Patient performed supine bed exercises with increased active participation today (performing ~50% effort on some repetitions). Patient continues to be limited in her participation 2/2 to her level of cognition and decreased attention/limited arousal. Patient will continue to benefit from skilled therapeutic intervention to address deficits in strength, mobility, balance, and safety for improved overall QOL.   Patient left in bed with call bell in reach, SCDs applied, hip abudtcion wedge secured, and bed alarm set. All needs met.   Follow Up Recommendations  SNF     Equipment Recommendations  Other (comment)(TBD at next venue of care)    Recommendations for Other Services       Precautions / Restrictions Precautions Precautions: Fall Restrictions Weight Bearing Restrictions: Yes RLE Weight Bearing: Weight bearing as tolerated Other Position/Activity Restrictions: RLE; s/p R hip fx    Mobility  Bed Mobility Overal bed mobility: Needs Assistance Bed Mobility: Supine to Sit     Supine to sit: Max assist Sit to supine: Max assist   General bed mobility comments: Patient limited 2/2 to cognition and fatigue. Pleasantly unwilling to attempt.  Transfers Overall transfer level: Needs assistance               General transfer comment: Unable to assess at this time 2/2 cognition/refusal to participate in therapy.  Ambulation/Gait             General Gait Details: Unable to assess at this  time 2/2 cognition/refusal to participate in therapy.   Stairs             Wheelchair Mobility    Modified Rankin (Stroke Patients Only)       Balance                                            Cognition                                              Exercises Other Exercises Other Exercises: supine BLE exercises: heel slides AAROM x10, ankle pumps x10, hip ab/adduction x5 (AAROM)    General Comments        Pertinent Vitals/Pain      Home Living                      Prior Function            PT Goals (current goals can now be found in the care plan section) Acute Rehab PT Goals PT Goal Formulation: Patient unable to participate in goal setting    Frequency    7X/week      PT Plan Current plan remains appropriate    Co-evaluation              AM-PAC PT "6 Clicks" Mobility   Outcome Measure  Help needed turning from your back to your side while in a flat bed without using bedrails?: Total Help needed moving from lying on your back to sitting on the side of a flat bed without using bedrails?: Total Help needed moving to and from a bed to a chair (including a wheelchair)?: Total Help needed standing up from a chair using your arms (e.g., wheelchair or bedside chair)?: Total Help needed to walk in hospital room?: Total Help needed climbing 3-5 steps with a railing? : Total 6 Click Score: 6    End of Session Equipment Utilized During Treatment: Gait belt;Oxygen Activity Tolerance: Patient limited by pain;Other (comment);Patient limited by fatigue(Treatment limited secondary to advanced dementia) Patient left: in bed;with bed alarm set;with SCD's reapplied Nurse Communication: Mobility status PT Visit Diagnosis: Muscle weakness (generalized) (M62.81);Difficulty in walking, not elsewhere classified (R26.2);History of falling (Z91.81);Pain Pain - Right/Left: Right Pain - part of body: Hip      Time: 1050-1108 PT Time Calculation (min) (ACUTE ONLY): 18 min  Charges:  $Therapeutic Exercise: 8-22 mins            Myles Gip PT, DPT [phone]          12/18/2018, 12:14 PM

## 2018-12-18 NOTE — TOC Progression Note (Signed)
Transition of Care [facility]) - Progression Note    Patient Details  Name: Savannah Young MRN: [id number] Date of Birth: April 19, 1932  [facility] CM/SW Contact  Su Hilt, RN Phone Number: 12/18/2018, 9:25 AM  Clinical Narrative:     Spoke with Mardene Celeste the Niece and reviewed bed offer, she agreed to [facility], sent acceptance thru the Hub to [place]   Expected Discharge Plan: [place] Barriers to Discharge: Continued Medical Work up  Expected Discharge Plan and Services Expected Discharge Plan: [place]   Discharge Planning Services: CM Consult   Living arrangements for the past 2 months: [place]                           Social Determinants of Health (SDOH) Interventions    Readmission Risk Interventions No flowsheet data found.

## 2018-12-18 NOTE — Progress Notes (Signed)
Received phone call from IV team nurse saying the floor nurses can remove a midline catheter, that it is not necessary for them to remove it.

## 2019-02-23 ENCOUNTER — Other Ambulatory Visit: Payer: Self-pay

## 2019-02-23 ENCOUNTER — Emergency Department: Payer: Medicare Other

## 2019-02-23 ENCOUNTER — Inpatient Hospital Stay
Admission: EM | Admit: 2019-02-23 | Discharge: 2019-02-26 | DRG: 640 | Disposition: A | Discharge status: Hospice | Payer: Medicare Other | Source: Skilled Nursing Facility | Attending: Internal Medicine | Admitting: Internal Medicine

## 2019-02-23 DIAGNOSIS — N179 Acute kidney failure, unspecified: Secondary | ICD-10-CM | POA: Diagnosis present

## 2019-02-23 DIAGNOSIS — Z8673 Personal history of transient ischemic attack (TIA), and cerebral infarction without residual deficits: Secondary | ICD-10-CM | POA: Diagnosis not present

## 2019-02-23 DIAGNOSIS — Z8249 Family history of ischemic heart disease and other diseases of the circulatory system: Secondary | ICD-10-CM

## 2019-02-23 DIAGNOSIS — I959 Hypotension, unspecified: Secondary | ICD-10-CM | POA: Diagnosis present

## 2019-02-23 DIAGNOSIS — Z681 Body mass index (BMI) 19 or less, adult: Secondary | ICD-10-CM | POA: Diagnosis not present

## 2019-02-23 DIAGNOSIS — Z96641 Presence of right artificial hip joint: Secondary | ICD-10-CM | POA: Diagnosis present

## 2019-02-23 DIAGNOSIS — I10 Essential (primary) hypertension: Secondary | ICD-10-CM | POA: Diagnosis present

## 2019-02-23 DIAGNOSIS — D473 Essential (hemorrhagic) thrombocythemia: Secondary | ICD-10-CM | POA: Diagnosis present

## 2019-02-23 DIAGNOSIS — E860 Dehydration: Secondary | ICD-10-CM | POA: Diagnosis not present

## 2019-02-23 DIAGNOSIS — E119 Type 2 diabetes mellitus without complications: Secondary | ICD-10-CM | POA: Diagnosis present

## 2019-02-23 DIAGNOSIS — D696 Thrombocytopenia, unspecified: Secondary | ICD-10-CM | POA: Diagnosis not present

## 2019-02-23 DIAGNOSIS — L8990 Pressure ulcer of unspecified site, unspecified stage: Secondary | ICD-10-CM | POA: Insufficient documentation

## 2019-02-23 DIAGNOSIS — Z66 Do not resuscitate: Secondary | ICD-10-CM | POA: Diagnosis present

## 2019-02-23 DIAGNOSIS — R7989 Other specified abnormal findings of blood chemistry: Secondary | ICD-10-CM | POA: Diagnosis present

## 2019-02-23 DIAGNOSIS — Z7982 Long term (current) use of aspirin: Secondary | ICD-10-CM | POA: Diagnosis not present

## 2019-02-23 DIAGNOSIS — Z87891 Personal history of nicotine dependence: Secondary | ICD-10-CM

## 2019-02-23 DIAGNOSIS — J449 Chronic obstructive pulmonary disease, unspecified: Secondary | ICD-10-CM | POA: Diagnosis present

## 2019-02-23 DIAGNOSIS — E43 Unspecified severe protein-calorie malnutrition: Secondary | ICD-10-CM | POA: Diagnosis not present

## 2019-02-23 DIAGNOSIS — Z7189 Other specified counseling: Secondary | ICD-10-CM | POA: Diagnosis not present

## 2019-02-23 DIAGNOSIS — E871 Hypo-osmolality and hyponatremia: Secondary | ICD-10-CM | POA: Diagnosis present

## 2019-02-23 DIAGNOSIS — N390 Urinary tract infection, site not specified: Secondary | ICD-10-CM | POA: Diagnosis present

## 2019-02-23 DIAGNOSIS — L89152 Pressure ulcer of sacral region, stage 2: Secondary | ICD-10-CM | POA: Diagnosis present

## 2019-02-23 DIAGNOSIS — Z79891 Long term (current) use of opiate analgesic: Secondary | ICD-10-CM

## 2019-02-23 DIAGNOSIS — Z1159 Encounter for screening for other viral diseases: Secondary | ICD-10-CM

## 2019-02-23 DIAGNOSIS — Z7901 Long term (current) use of anticoagulants: Secondary | ICD-10-CM | POA: Diagnosis not present

## 2019-02-23 DIAGNOSIS — Z515 Encounter for palliative care: Secondary | ICD-10-CM | POA: Diagnosis not present

## 2019-02-23 DIAGNOSIS — Z79899 Other long term (current) drug therapy: Secondary | ICD-10-CM | POA: Diagnosis not present

## 2019-02-23 DIAGNOSIS — Z79890 Hormone replacement therapy (postmenopausal): Secondary | ICD-10-CM

## 2019-02-23 DIAGNOSIS — E870 Hyperosmolality and hypernatremia: Secondary | ICD-10-CM | POA: Diagnosis not present

## 2019-02-23 DIAGNOSIS — E785 Hyperlipidemia, unspecified: Secondary | ICD-10-CM | POA: Diagnosis present

## 2019-02-23 DIAGNOSIS — F0390 Unspecified dementia without behavioral disturbance: Secondary | ICD-10-CM | POA: Diagnosis present

## 2019-02-23 LAB — COMPREHENSIVE METABOLIC PANEL
ALT: 17 U/L (ref 0–44)
AST: 30 U/L (ref 15–41)
Albumin: 3.8 g/dL (ref 3.5–5.0)
Alkaline Phosphatase: 110 U/L (ref 38–126)
BUN: 68 mg/dL — ABNORMAL HIGH (ref 8–23)
CO2: 26 mmol/L (ref 22–32)
Calcium: 9.3 mg/dL (ref 8.9–10.3)
Chloride: >130 mmol/L (ref 98–111)
Creatinine, Ser: 1.57 mg/dL — ABNORMAL HIGH (ref 0.44–1.00)
GFR calc Af Amer: 34 mL/min — ABNORMAL LOW (ref >60)
GFR calc non Af Amer: 30 mL/min — ABNORMAL LOW (ref >60)
Glucose, Bld: 130 mg/dL — ABNORMAL HIGH (ref 70–99)
Potassium: 4.2 mmol/L (ref 3.5–5.1)
Sodium: 167 mmol/L (ref 135–145)
Total Bilirubin: 0.6 mg/dL (ref 0.3–1.2)
Total Protein: 7.9 g/dL (ref 6.5–8.1)

## 2019-02-23 LAB — TROPONIN I (HIGH SENSITIVITY)
Troponin I (High Sensitivity): 14 ng/L (ref <18)
Troponin I (High Sensitivity): 14 ng/L (ref <18)
Troponin I (High Sensitivity): 11 ng/L (ref <18)

## 2019-02-23 LAB — URINALYSIS, COMPLETE (UACMP) WITH MICROSCOPIC
Bilirubin Urine: NEGATIVE
Glucose, UA: NEGATIVE mg/dL
Hgb urine dipstick: NEGATIVE
Ketones, ur: NEGATIVE mg/dL
Leukocytes,Ua: NEGATIVE
Nitrite: NEGATIVE
Protein, ur: NEGATIVE mg/dL
Specific Gravity, Urine: 1.024 (ref 1.005–1.030)
Squamous Epithelial / HPF: NONE SEEN (ref 0–5)
pH: 5 (ref 5.0–8.0)

## 2019-02-23 LAB — GLUCOSE, CAPILLARY: Glucose-Capillary: 108 mg/dL — ABNORMAL HIGH (ref 70–99)

## 2019-02-23 LAB — CBC WITH DIFFERENTIAL/PLATELET
Abs Immature Granulocytes: 0.06 10*3/uL (ref 0.00–0.07)
Basophils Absolute: 0.1 10*3/uL (ref 0.0–0.1)
Basophils Relative: 0 %
Eosinophils Absolute: 0 10*3/uL (ref 0.0–0.5)
Eosinophils Relative: 0 %
HCT: 44.9 % (ref 36.0–46.0)
Hemoglobin: 13.3 g/dL (ref 12.0–15.0)
Immature Granulocytes: 1 %
Lymphocytes Relative: 9 %
Lymphs Abs: 1 10*3/uL (ref 0.7–4.0)
MCH: 28.5 pg (ref 26.0–34.0)
MCHC: 29.6 g/dL — ABNORMAL LOW (ref 30.0–36.0)
MCV: 96.4 fL (ref 80.0–100.0)
Monocytes Absolute: 1 10*3/uL (ref 0.1–1.0)
Monocytes Relative: 9 %
Neutro Abs: 9.6 10*3/uL — ABNORMAL HIGH (ref 1.7–7.7)
Neutrophils Relative %: 81 %
Platelets: 1034 10*3/uL (ref 150–400)
RBC: 4.66 MIL/uL (ref 3.87–5.11)
RDW: 17.8 % — ABNORMAL HIGH (ref 11.5–15.5)
WBC: 11.7 10*3/uL — ABNORMAL HIGH (ref 4.0–10.5)
nRBC: 0.2 % (ref 0.0–0.2)

## 2019-02-23 MED ORDER — MIRTAZAPINE 15 MG PO TABS
7.5000 mg | ORAL_TABLET | Freq: Every day | ORAL | Status: DC
  Administered 2019-02-23 – 2019-02-25 (×3): 7.5 mg via ORAL
  Filled 2019-02-23 (×3): qty 1

## 2019-02-23 MED ORDER — LORAZEPAM 0.5 MG PO TABS
0.5000 mg | ORAL_TABLET | Freq: Every day | ORAL | Status: DC
  Administered 2019-02-24 – 2019-02-26 (×3): 0.5 mg via ORAL
  Filled 2019-02-23 (×3): qty 1

## 2019-02-23 MED ORDER — QUETIAPINE FUMARATE 25 MG PO TABS
12.5000 mg | ORAL_TABLET | Freq: Every day | ORAL | Status: DC
  Administered 2019-02-24 – 2019-02-26 (×3): 12.5 mg via ORAL
  Filled 2019-02-23 (×3): qty 1

## 2019-02-23 MED ORDER — DEXTROSE 5 % IV SOLN
INTRAVENOUS | Status: AC
  Administered 2019-02-23 – 2019-02-24 (×3): via INTRAVENOUS

## 2019-02-23 MED ORDER — BISACODYL 5 MG PO TBEC
5.0000 mg | DELAYED_RELEASE_TABLET | Freq: Every day | ORAL | Status: DC | PRN
  Administered 2019-02-25 – 2019-02-26 (×2): 5 mg via ORAL
  Filled 2019-02-23 (×2): qty 1

## 2019-02-23 MED ORDER — FLEET ENEMA 7-19 GM/118ML RE ENEM: 1.0000 | ENEMA | Freq: Every day | RECTAL | Status: DC | PRN

## 2019-02-23 MED ORDER — INSULIN ASPART 100 UNIT/ML ~~LOC~~ SOLN
0.0000 [IU] | Freq: Three times a day (TID) | SUBCUTANEOUS | Status: DC
  Administered 2019-02-24: 10:00:00 1 [IU] via SUBCUTANEOUS
  Administered 2019-02-25: 2 [IU] via SUBCUTANEOUS
  Administered 2019-02-26: 1 [IU] via SUBCUTANEOUS
  Filled 2019-02-23 (×3): qty 1

## 2019-02-23 MED ORDER — ALBUTEROL SULFATE (2.5 MG/3ML) 0.083% IN NEBU: 2.5000 mg | INHALATION_SOLUTION | RESPIRATORY_TRACT | Status: DC | PRN

## 2019-02-23 MED ORDER — HEPARIN SODIUM (PORCINE) 5000 UNIT/ML IJ SOLN
5000.0000 [IU] | Freq: Three times a day (TID) | INTRAMUSCULAR | Status: DC
  Administered 2019-02-24 – 2019-02-26 (×7): 5000 [IU] via SUBCUTANEOUS
  Filled 2019-02-23 (×7): qty 1

## 2019-02-23 MED ORDER — TRAZODONE HCL 50 MG PO TABS: 25.0000 mg | ORAL_TABLET | ORAL | Status: DC

## 2019-02-23 MED ORDER — FLUTICASONE PROPIONATE 50 MCG/ACT NA SUSP
2.0000 | Freq: Every day | NASAL | Status: DC
  Administered 2019-02-24 – 2019-02-26 (×3): 2 via NASAL
  Filled 2019-02-23: qty 16

## 2019-02-23 MED ORDER — DEXTROSE 5 % IV SOLN
Freq: Once | INTRAVENOUS | Status: AC
  Administered 2019-02-23: 19:00:00 via INTRAVENOUS

## 2019-02-23 MED ORDER — LORATADINE 10 MG PO TABS
10.0000 mg | ORAL_TABLET | Freq: Every day | ORAL | Status: DC
  Administered 2019-02-24 – 2019-02-26 (×3): 10 mg via ORAL
  Filled 2019-02-23 (×3): qty 1

## 2019-02-23 MED ORDER — ACETAMINOPHEN 325 MG PO TABS
650.0000 mg | ORAL_TABLET | Freq: Four times a day (QID) | ORAL | Status: DC | PRN
  Administered 2019-02-25 – 2019-02-26 (×2): 650 mg via ORAL
  Filled 2019-02-23 (×2): qty 2

## 2019-02-23 MED ORDER — INSULIN ASPART 100 UNIT/ML ~~LOC~~ SOLN: 0.0000 [IU] | Freq: Every day | SUBCUTANEOUS | Status: DC

## 2019-02-23 MED ORDER — BISMUTH SUBSALICYLATE 262 MG/15ML PO SUSP
15.0000 mL | ORAL | Status: DC | PRN
  Filled 2019-02-23: qty 118

## 2019-02-23 MED ORDER — HYDROXYUREA 500 MG PO CAPS
500.0000 mg | ORAL_CAPSULE | Freq: Every day | ORAL | Status: DC
  Administered 2019-02-24 – 2019-02-26 (×3): 500 mg via ORAL
  Filled 2019-02-23 (×3): qty 1

## 2019-02-23 MED ORDER — MEMANTINE HCL ER 28 MG PO CP24
28.0000 mg | ORAL_CAPSULE | Freq: Every day | ORAL | Status: DC
  Administered 2019-02-23 – 2019-02-25 (×3): 28 mg via ORAL
  Filled 2019-02-23 (×4): qty 1

## 2019-02-23 MED ORDER — TRAZODONE HCL 50 MG PO TABS: 25.0000 mg | ORAL_TABLET | ORAL | Status: DC | PRN

## 2019-02-23 MED ORDER — SODIUM CHLORIDE 0.9 % IV SOLN: 250.0000 mL | INTRAVENOUS | Status: DC | PRN

## 2019-02-23 MED ORDER — ONDANSETRON HCL 4 MG PO TABS: 4.0000 mg | ORAL_TABLET | Freq: Four times a day (QID) | ORAL | Status: DC | PRN

## 2019-02-23 MED ORDER — SODIUM CHLORIDE 0.9% FLUSH
3.0000 mL | Freq: Two times a day (BID) | INTRAVENOUS | Status: DC
  Administered 2019-02-25 – 2019-02-26 (×3): 3 mL via INTRAVENOUS

## 2019-02-23 MED ORDER — ACETAMINOPHEN 650 MG RE SUPP: 650.0000 mg | Freq: Four times a day (QID) | RECTAL | Status: DC | PRN

## 2019-02-23 MED ORDER — FERROUS GLUCONATE 240 (27 FE) MG PO TABS: 240.0000 mg | ORAL_TABLET | Freq: Every day | ORAL | Status: DC

## 2019-02-23 MED ORDER — ALUM & MAG HYDROXIDE-SIMETH 200-200-20 MG/5ML PO SUSP: 30.0000 mL | Freq: Every day | ORAL | Status: DC | PRN

## 2019-02-23 MED ORDER — GUAIFENESIN-DM 100-10 MG/5ML PO SYRP: 10.0000 mL | ORAL_SOLUTION | Freq: Four times a day (QID) | ORAL | Status: DC | PRN

## 2019-02-23 MED ORDER — DOCUSATE SODIUM 100 MG PO CAPS: 100.0000 mg | ORAL_CAPSULE | Freq: Two times a day (BID) | ORAL | Status: DC | PRN

## 2019-02-23 MED ORDER — SODIUM CHLORIDE 0.9% FLUSH: 3.0000 mL | INTRAVENOUS | Status: DC | PRN

## 2019-02-23 MED ORDER — LORAZEPAM 0.5 MG PO TABS: 0.5000 mg | ORAL_TABLET | Freq: Four times a day (QID) | ORAL | Status: DC | PRN

## 2019-02-23 MED ORDER — SODIUM CHLORIDE 0.9 % IV BOLUS
250.0000 mL | Freq: Once | INTRAVENOUS | Status: AC
  Administered 2019-02-23: 250 mL via INTRAVENOUS

## 2019-02-23 MED ORDER — ASPIRIN 81 MG PO CHEW
81.0000 mg | CHEWABLE_TABLET | Freq: Every day | ORAL | Status: DC
  Administered 2019-02-24 – 2019-02-26 (×3): 81 mg via ORAL
  Filled 2019-02-23 (×3): qty 1

## 2019-02-23 MED ORDER — HYDROCODONE-ACETAMINOPHEN 5-325 MG PO TABS: 1.0000 | ORAL_TABLET | ORAL | Status: DC | PRN

## 2019-02-23 MED ORDER — QUETIAPINE FUMARATE 25 MG PO TABS
25.0000 mg | ORAL_TABLET | Freq: Every day | ORAL | Status: DC
  Administered 2019-02-23 – 2019-02-25 (×3): 25 mg via ORAL
  Filled 2019-02-23 (×3): qty 1

## 2019-02-23 MED ORDER — SENNOSIDES-DOCUSATE SODIUM 8.6-50 MG PO TABS: 1.0000 | ORAL_TABLET | Freq: Every evening | ORAL | Status: DC | PRN

## 2019-02-23 MED ORDER — LOPERAMIDE HCL 2 MG PO CAPS: 2.0000 mg | ORAL_CAPSULE | ORAL | Status: DC | PRN

## 2019-02-23 MED ORDER — ONDANSETRON HCL 4 MG/2ML IJ SOLN: 4.0000 mg | Freq: Four times a day (QID) | INTRAMUSCULAR | Status: DC | PRN

## 2019-02-23 MED ORDER — PANTOPRAZOLE SODIUM 40 MG PO TBEC
40.0000 mg | DELAYED_RELEASE_TABLET | Freq: Every day | ORAL | Status: DC
  Administered 2019-02-24 – 2019-02-26 (×3): 40 mg via ORAL
  Filled 2019-02-23 (×3): qty 1

## 2019-02-23 NOTE — ED Notes (Signed)
ED TO INPATIENT HANDOFF REPORT  ED Nurse Name and Phone #:   S Name/Age/Gender Savannah Young 85 y.o. female Room/Bed: ED16A/ED16A  Code Status   Code Status: Prior  Home/SNF/Other Nursing Home Patient oriented to: self Is this baseline? Yes   Triage Complete: Triage complete  Chief Complaint Possible UTI  [facility] Note [facility] with weakness, decreased appetite and history of UTI   Allergies No Known Allergies  Level of Care/Admitting Diagnosis ED Disposition    ED Disposition Condition [facility] Area: [place] [[id number]]  Level of Care: Med-Surg [16]  Covid Evaluation: Screening Protocol (No Symptoms)  Diagnosis: Hyponatremia [675916]  Admitting Physician: Demetrios Loll [[id number]]  Attending Physician: Demetrios Loll [[id number]]  Estimated length of stay: 3 - 4 days  Certification:: I certify this patient will need inpatient services for at least 2 midnights  PT Class (Do Not Modify): Inpatient [101]  PT Acc Code (Do Not Modify): Private [1]       B Medical/Surgery History Past Medical History:  Diagnosis Date  . COPD (chronic obstructive pulmonary disease) ([place])   . Dementia ([place])   . Diabetes mellitus ([place])   . Hypertension    Past Surgical History:  Procedure Laterality Date  . ESOPHAGOGASTRODUODENOSCOPY (EGD) WITH PROPOFOL N/A 10/16/2016   Procedure: ESOPHAGOGASTRODUODENOSCOPY (EGD) WITH PROPOFOL;  Surgeon: San Jetty, MD;  Location: [facility] ENDOSCOPY;  Service: Gastroenterology;  Laterality: N/A;  . HIP ARTHROPLASTY Right 12/15/2018   Procedure: ARTHROPLASTY BIPOLAR HIP (HEMIARTHROPLASTY);  Surgeon: Thornton Park, MD;  Location: [facility] ORS;  Service: Orthopedics;  Laterality: Right;     A IV Location/Drains/Wounds Patient Lines/Drains/Airways Status   Active Line/Drains/Airways    Name:   Placement date:   Placement time:   Site:   Days:   Peripheral IV 01/25/17 Right Forearm   01/25/17     0335    Forearm   759   Peripheral IV 02/23/19 Right Antecubital   02/23/19    1643    Antecubital   less than 1   External Urinary Catheter   12/17/18    0350    -   68   Incision (Closed) 12/15/18 Leg Right   12/15/18    1705     70          Intake/Output Last 24 hours No intake or output data in the 24 hours ending 02/23/19 2149  Labs/Imaging Results for orders placed or performed during the hospital encounter of 02/23/19 (from the past 48 hour(s))  Comprehensive metabolic panel     Status: Abnormal   Collection Time: 02/23/19  4:17 PM  Result Value Ref Range   Sodium 167 (HH) 135 - 145 mmol/L    Comment: CRITICAL RESULT CALLED TO, READ BACK BY AND VERIFIED WITH STEVEN JONES AT 1800 02/23/2019.PMF   Potassium 4.2 3.5 - 5.1 mmol/L   Chloride >130 (HH) 98 - 111 mmol/L    Comment: CRITICAL RESULT CALLED TO, READ BACK BY AND VERIFIED WITH STEVEN JONES AT 1800 02/23/2019.PMF   CO2 26 22 - 32 mmol/L   Glucose, Bld 130 (H) 70 - 99 mg/dL   BUN 68 (H) 8 - 23 mg/dL   Creatinine, Ser 1.57 (H) 0.44 - 1.00 mg/dL   Calcium 9.3 8.9 - 10.3 mg/dL   Total Protein 7.9 6.5 - 8.1 g/dL   Albumin 3.8 3.5 - 5.0 g/dL   AST 30 15 - 41 U/L   ALT 17 0 -  44 U/L   Alkaline Phosphatase 110 38 - 126 U/L   Total Bilirubin 0.6 0.3 - 1.2 mg/dL   GFR calc non Af Amer 30 (L) >60 mL/min   GFR calc Af Amer 34 (L) >60 mL/min   Anion gap NOT CALCULATED 5 - 15    Comment: Performed at [facility], [address] [place], [place] [address]  CBC with Differential     Status: Abnormal   Collection Time: 02/23/19  4:17 PM  Result Value Ref Range   WBC 11.7 (H) 4.0 - 10.5 K/uL   RBC 4.66 3.87 - 5.11 MIL/uL   Hemoglobin 13.3 12.0 - 15.0 g/dL   HCT 44.9 36.0 - 46.0 %   MCV 96.4 80.0 - 100.0 fL   MCH 28.5 26.0 - 34.0 pg   MCHC 29.6 (L) 30.0 - 36.0 g/dL   RDW 17.8 (H) 11.5 - 15.5 %   Platelets 1,034 (HH) 150 - 400 K/uL    Comment: This critical result has verified and been called to Debby Freiberg by  Janeece Agee on 06 27 2020 at 1742, and has been read back.    nRBC 0.2 0.0 - 0.2 %   Neutrophils Relative % 81 %   Neutro Abs 9.6 (H) 1.7 - 7.7 K/uL   Lymphocytes Relative 9 %   Lymphs Abs 1.0 0.7 - 4.0 K/uL   Monocytes Relative 9 %   Monocytes Absolute 1.0 0.1 - 1.0 K/uL   Eosinophils Relative 0 %   Eosinophils Absolute 0.0 0.0 - 0.5 K/uL   Basophils Relative 0 %   Basophils Absolute 0.1 0.0 - 0.1 K/uL   Immature Granulocytes 1 %   Abs Immature Granulocytes 0.06 0.00 - 0.07 K/uL   Polychromasia PRESENT    Ovalocytes PRESENT     Comment: Performed at [facility], [address] [place], [place] [address]  Urinalysis, Complete w Microscopic     Status: Abnormal   Collection Time: 02/23/19  4:17 PM  Result Value Ref Range   Color, Urine YELLOW (A) YELLOW   APPearance HAZY (A) CLEAR   Specific Gravity, Urine 1.024 1.005 - 1.030   pH 5.0 5.0 - 8.0   Glucose, UA NEGATIVE NEGATIVE mg/dL   Hgb urine dipstick NEGATIVE NEGATIVE   Bilirubin Urine NEGATIVE NEGATIVE   Ketones, ur NEGATIVE NEGATIVE mg/dL   Protein, ur NEGATIVE NEGATIVE mg/dL   Nitrite NEGATIVE NEGATIVE   Leukocytes,Ua NEGATIVE NEGATIVE   RBC / HPF 0-5 0 - 5 RBC/hpf   WBC, UA 0-5 0 - 5 WBC/hpf   Bacteria, UA MANY (A) NONE SEEN   Squamous Epithelial / LPF NONE SEEN 0 - 5   Mucus PRESENT    Amorphous Crystal PRESENT     Comment: Performed at [facility], [place], [place] [address]  Troponin I (High Sensitivity)     Status: None   Collection Time: 02/23/19  4:17 PM  Result Value Ref Range   Troponin I (High Sensitivity) 14 <18 ng/L    Comment: (NOTE) Elevated high sensitivity troponin I (hsTnI) values and significant  changes across serial measurements may suggest ACS but many other  chronic and acute conditions are known to elevate hsTnI results.  Refer to the Links section for chest pain algorithms and additional  guidance. Performed at [facility], [address], [place], [place] [address]   Troponin I (High Sensitivity)     Status: None   Collection Time: 02/23/19  4:17  PM  Result Value Ref Range   Troponin I (High Sensitivity) 14 <18 ng/L    Comment: (NOTE) Elevated high sensitivity troponin I (hsTnI) values and significant  changes across serial measurements may suggest ACS but many other  chronic and acute conditions are known to elevate hsTnI results.  Refer to the "Links" section for chest pain algorithms and additional  guidance. Performed at [facility], [place], [place] [address]   Troponin I (High Sensitivity)     Status: None   Collection Time: 02/23/19  7:17 PM  Result Value Ref Range   Troponin I (High Sensitivity) 11 <18 ng/L    Comment: (NOTE) Elevated high sensitivity troponin I (hsTnI) values and significant  changes across serial measurements may suggest ACS but many other  chronic and acute conditions are known to elevate hsTnI results.  Refer to the "Links" section for chest pain algorithms and additional  guidance. Performed at [facility], [address] [place],  [address]    Ct Head Wo Contrast  Result Date: 02/23/2019 CLINICAL DATA:  Weakness. EXAM: CT HEAD WITHOUT CONTRAST TECHNIQUE: Contiguous axial images were obtained from the base of the skull through the vertex without intravenous contrast. COMPARISON:  12/14/2018 FINDINGS: Brain: There is no evidence of acute infarct, intracranial hemorrhage, mass, midline shift, or extra-axial fluid collection. Cerebral atrophy is unchanged. Cerebral white matter hypodensities are unchanged and nonspecific but compatible with minimal chronic small vessel ischemic disease. Vascular: Calcified atherosclerosis at the skull base. No hyperdense vessel. Skull: No fracture or focal osseous lesion. Sinuses/Orbits: Visualized paranasal sinuses and mastoid air cells are clear. Visualized orbits are unremarkable. Other: None. IMPRESSION:  No evidence of acute intracranial abnormality. Electronically Signed   By: Logan Bores M.D.   On: 02/23/2019 18:09    Pending Labs Unresulted Labs (From admission, onward)    Start     Ordered   02/23/19 2038  Novel Coronavirus,NAA,(SEND-OUT TO REF LAB - TAT 24-48 hrs); Hosp Order  (Asymptomatic Patients Labs)  Once,   STAT    Question:  Rule Out  Answer:  Yes   02/23/19 2037   02/23/19 2008  Urine culture  Add-on,   AD     02/23/19 2007   02/23/19 1617  Pathologist smear review  Once,   STAT     02/23/19 1617   Signed and Held  Creatinine, serum  (heparin)  Once,   R    Comments: Baseline for heparin therapy IF NOT ALREADY DRAWN.    Signed and Held   Signed and Held  Basic metabolic panel  Tomorrow morning,   R     Signed and Held   Signed and Held  CBC  Tomorrow morning,   R     Signed and Held          Vitals/Pain Today's Vitals   02/23/19 2000 02/23/19 2015 02/23/19 2030 02/23/19 2100  BP: 113/74  (!) 85/61 116/76  Pulse: 92 (!) 56 91   Resp: 17 17 20 16   Temp:      TempSrc:      SpO2: 97% 96% 98%   Weight:      Height:      PainSc:        Isolation Precautions No active isolations  Medications Medications  dextrose 5 % solution ( Intravenous New Bag/Given 02/23/19 1921)  sodium chloride 0.9 % bolus 250 mL (250 mLs Intravenous New Bag/Given 02/23/19 2055)    Mobility walks High  fall risk   Focused Assessments Renal Assessment Handoff:        R Recommendations: See Admitting Provider Note  Report given to:   Additional Notes:

## 2019-02-23 NOTE — ED Notes (Signed)
Date and time results received: 02/23/19 5:59 PM (use smartphrase ".now" to insert current time)  Test: Sodium Critical Value: 167  Name of Provider Notified: Dr. Cherylann Banas  Orders Received? Or Actions Taken?: No new orders at this time.

## 2019-02-23 NOTE — ED Notes (Signed)
Patient transported to CT 

## 2019-02-23 NOTE — ED Notes (Signed)
Date and time results received: 02/23/19 6:00 PM (use smartphrase ".now" to insert current time)  Test: Coag Critical Value: >130  Name of Provider Notified: Dr. Cherylann Banas  Orders Received? Or Actions Taken?: No new orders at this time.

## 2019-02-23 NOTE — ED Notes (Signed)
Returned from CT awaiting further orders vss. Safety maintained.

## 2019-02-23 NOTE — ED Notes (Addendum)
Savannah Young at [facility] called and updated on plan to admit patient. Will notify family. Patient swab for COVID awaiting admission.

## 2019-02-23 NOTE — H&P (Signed)
[facility] [facility] [place] NAME: Savannah Young    MR#:  [id number]  DATE OF BIRTH:  10-07-1931  DATE OF ADMISSION:  02/23/2019  PRIMARY CARE PHYSICIAN: Raelyn Number, MD   REQUESTING/REFERRING PHYSICIAN: Victorino Dike, FNP  CHIEF COMPLAINT:   Chief Complaint  Patient presents with  . Weakness  . Urinary Tract Infection   Confusion and the generalized weakness. HISTORY OF PRESENT ILLNESS:  Savannah Young  is a 85 y.o. female with a known history of COPD, hypertension, diabetes and dementia.  The patient is sent from nursing home to ED due to above chief complaints.  She is found decreased appetite, generalized weakness and confusion.  She suspected UTI and sent to ED for further evaluation.  In the ED, she is found severe hyponatremia with sodium level at 167.  ED NP request admission. PAST MEDICAL HISTORY:   Past Medical History:  Diagnosis Date  . COPD (chronic obstructive pulmonary disease) ([place])   . Dementia ([place])   . Diabetes mellitus ([place])   . Hypertension     PAST SURGICAL HISTORY:   Past Surgical History:  Procedure Laterality Date  . ESOPHAGOGASTRODUODENOSCOPY (EGD) WITH PROPOFOL N/A 10/16/2016   Procedure: ESOPHAGOGASTRODUODENOSCOPY (EGD) WITH PROPOFOL;  Surgeon: San Jetty, MD;  Location: [facility] ENDOSCOPY;  Service: Gastroenterology;  Laterality: N/A;  . HIP ARTHROPLASTY Right 12/15/2018   Procedure: ARTHROPLASTY BIPOLAR HIP (HEMIARTHROPLASTY);  Surgeon: Thornton Park, MD;  Location: [facility] ORS;  Service: Orthopedics;  Laterality: Right;    SOCIAL HISTORY:   Social History   Tobacco Use  . Smoking status: Never Smoker  . Smokeless tobacco: Former Network engineer Use Topics  . Alcohol use: No    FAMILY HISTORY:   Family History  Problem Relation Age of Onset  . Hypertension Other     DRUG ALLERGIES:  No Known Allergies  REVIEW OF SYSTEMS:   Review of Systems  Unable to perform ROS: Mental status  change    MEDICATIONS AT HOME:   Prior to Admission medications   Medication Sig Start Date End Date Taking? Authorizing Provider  acetaminophen (TYLENOL) 325 MG tablet Take 650 mg by mouth every 4 (four) hours as needed for mild pain, fever or headache.   Yes [provider]  alum & mag hydroxide-simeth (MINTOX) 200-200-20 MG/5ML suspension Take 30 mLs by mouth daily as needed for indigestion or heartburn.   Yes [provider]  aspirin 81 MG chewable tablet Chew 81 mg by mouth daily.   Yes [provider]  atorvastatin (LIPITOR) 20 MG tablet Take 1 tablet (20 mg total) by mouth daily. 02/24/16  Yes Theodoro Grist, MD  bismuth subsalicylate (PEPTO BISMOL) 262 MG/15ML suspension Take 15 mLs by mouth every 2 (two) hours as needed (nausea/vomiting - max 6 doses in 24 hours).   Yes [provider]  docusate sodium (COLACE) 100 MG capsule Take 1 capsule (100 mg total) by mouth 2 (two) times daily as needed for mild constipation. 10/16/16  Yes Theodoro Grist, MD  ferrous gluconate (FERGON) 240 (27 FE) MG tablet Take 240 mg by mouth daily with breakfast.    Yes [provider]  fluticasone (FLONASE) 50 MCG/ACT nasal spray Place 2 sprays into both nostrils daily.   Yes [provider]  hydroxyurea (HYDREA) 500 MG capsule Take 1 capsule (500 mg total) by mouth daily. May take with food to minimize GI side effects. 04/04/17  Yes Lloyd Huger, MD  loperamide (  IMODIUM) 2 MG capsule Take 2 mg by mouth as needed for diarrhea or loose stools (max 4 doses in 24 hours).   Yes [provider]  loratadine (CLARITIN) 10 MG tablet Take 10 mg by mouth daily.   Yes [provider]  LORazepam (ATIVAN) 0.5 MG tablet Take 0.5 mg by mouth daily. At [facility]   Yes [provider]  LORazepam (ATIVAN) 0.5 MG tablet Take 0.5 mg by mouth every 6 (six) hours as needed for anxiety.    Yes [provider]  magnesium hydroxide (MILK OF  MAGNESIA) 400 MG/5ML suspension Take 30 mLs by mouth daily as needed for moderate constipation.   Yes [provider]  Melatonin 3 MG TABS Take 3 mg by mouth at bedtime.    Yes [provider]  memantine (NAMENDA XR) 28 MG CP24 24 hr capsule Take 28 mg by mouth at bedtime.   Yes [provider]  metoprolol tartrate (LOPRESSOR) 25 MG tablet Take 1 tablet (25 mg total) by mouth 2 (two) times daily. 10/16/16  Yes Theodoro Grist, MD  mirtazapine (REMERON) 15 MG tablet Take 7.5 mg by mouth at bedtime.    Yes [provider]  pantoprazole (PROTONIX) 40 MG tablet Take 1 tablet (40 mg total) by mouth daily. 10/16/16  Yes Theodoro Grist, MD  Phenylephrine-DM-GG (ROBITUSSIN TO GO COUGH/COLD CF) 5-10-100 MG/5ML LIQD Take 10 mLs by mouth every 6 (six) hours as needed (cough).   Yes [provider]  polyethylene glycol (MIRALAX / GLYCOLAX) 17 g packet Take 17 g by mouth daily as needed for mild constipation. Patient taking differently: Take 17 g by mouth daily.  12/18/18  Yes Henreitta Leber, MD  QUEtiapine (SEROQUEL) 25 MG tablet Take 0.5 tablets (12.5 mg total) by mouth daily before supper. Patient taking differently: Take 12.5 mg by mouth daily.  10/16/16  Yes Theodoro Grist, MD  QUEtiapine (SEROQUEL) 25 MG tablet Take 25 mg by mouth at bedtime.   Yes [provider]  sodium phosphate (FLEET) 7-19 GM/118ML ENEM Place 1 enema rectally daily as needed for severe constipation.   Yes [provider]  traMADol (ULTRAM) 50 MG tablet Take 1 tablet (50 mg total) by mouth every 6 (six) hours as needed. 12/18/18  Yes Henreitta Leber, MD  traZODone (DESYREL) 50 MG tablet Take 12.5 mg by mouth every 4 (four) hours as needed (agitation).    Yes [provider]  traZODone (DESYREL) 50 MG tablet Take 25 mg by mouth See admin instructions. Take  tablet (25mg ) by mouth 30 minutes before showering   Yes [provider]  enoxaparin (LOVENOX) 30  MG/0.3ML injection Inject 0.3 mLs (30 mg total) into the skin every 12 (twelve) hours for 14 days. 12/18/18 01/01/19  Henreitta Leber, MD      VITAL SIGNS:  Blood pressure (!) 86/65, pulse 89, temperature (!) 97.5 F (36.4 C), temperature source Oral, resp. rate (!) 21, height 5\' 1"  (1.549 m), weight 45 kg, last menstrual period 01/20/2016, SpO2 97 %.  PHYSICAL EXAMINATION:  Physical Exam  GENERAL:  85 y.o.-year-old patient lying in the bed with no acute distress.  EYES: Pupils equal, round, reactive to light and accommodation. No scleral icterus. Extraocular muscles intact.  HEENT: Head atraumatic, normocephalic.  NECK:  Supple, no jugular venous distention. No thyroid enlargement, no tenderness.  LUNGS: Normal breath sounds bilaterally, no wheezing, rales,rhonchi or crepitation. No use of accessory muscles of respiration.  CARDIOVASCULAR: S1, S2 normal. No murmurs,  rubs, or gallops.  ABDOMEN: Soft, nontender, nondistended. Bowel sounds present. No organomegaly or mass.  EXTREMITIES: NNot follow command, unable to exam. PSYCHIATRIC: The patient is confused and lethargic. SKIN: No obvious rash, lesion, or ulcer.   LABORATORY PANEL:   CBC Recent Labs  Lab 02/23/19 1617  WBC 11.7*  HGB 13.3  HCT 44.9  PLT 1,034*   ------------------------------------------------------------------------------------------------------------------  Chemistries  Recent Labs  Lab 02/23/19 1617  NA 167*  K 4.2  CL >130*  CO2 26  GLUCOSE 130*  BUN 68*  CREATININE 1.57*  CALCIUM 9.3  AST 30  ALT 17  ALKPHOS 110  BILITOT 0.6   ------------------------------------------------------------------------------------------------------------------  Cardiac Enzymes No results for input(s): TROPONINI in the last 168 hours. ------------------------------------------------------------------------------------------------------------------  RADIOLOGY:  Ct Head Wo Contrast  Result Date:  02/23/2019 CLINICAL DATA:  Weakness. EXAM: CT HEAD WITHOUT CONTRAST TECHNIQUE: Contiguous axial images were obtained from the base of the skull through the vertex without intravenous contrast. COMPARISON:  12/14/2018 FINDINGS: Brain: There is no evidence of acute infarct, intracranial hemorrhage, mass, midline shift, or extra-axial fluid collection. Cerebral atrophy is unchanged. Cerebral white matter hypodensities are unchanged and nonspecific but compatible with minimal chronic small vessel ischemic disease. Vascular: Calcified atherosclerosis at the skull base. No hyperdense vessel. Skull: No fracture or focal osseous lesion. Sinuses/Orbits: Visualized paranasal sinuses and mastoid air cells are clear. Visualized orbits are unremarkable. Other: None. IMPRESSION: No evidence of acute intracranial abnormality. Electronically Signed   By: Logan Bores M.D.   On: 02/23/2019 18:09      IMPRESSION AND PLAN:   Hyponatremia. Hypotension. Due to renal failure due to dehydration. Chronic thrombocytosis. Hypertension.  Hold hypertension medication due to low set of blood pressure. Diabetes.  Sliding scale. All the records are reviewed and case discussed with ED provider. Management plans discussed with the patient's nephew and they are in agreement.  CODE STATUS: Full code for now.  TOTAL TIME TAKING CARE OF THIS PATIENT: 45 minutes.    Demetrios Loll M.D on 02/23/2019 at 8:35 PM  Between 7am to 6pm - Pager - [phone]  After 6pm go to [email or web address] - Proofreader  [facility]  Office  [phone]  CC: Primary care physician; Raelyn Number, MD   Note: This dictation was prepared with Dragon dictation along with smaller phrase technology. Any transcriptional errors that result from this process are unin

## 2019-02-23 NOTE — ED Triage Notes (Signed)
Sent from [facility] with weakness, decreased appetite and history of UTI

## 2019-02-23 NOTE — ED Notes (Signed)
Date and time results received: 02/23/19 5:46 PM (use smartphrase ".now" to insert current time)  Test: Platelets Critical Value: 1034  Name of Provider Notified: Dr. Cherylann Banas  Orders Received? Or Actions Taken?: No new orders at this time.

## 2019-02-23 NOTE — ED Notes (Signed)
Patient straight cath for urine. Specimen sent to lab

## 2019-02-23 NOTE — Progress Notes (Addendum)
Advanced Care Plan.  Purpose of Encounter: CODE STATUS Parties in Attendance: The patient, her nephew and me. Patient's Decisional Capacity: No.   Medical Story: Savannah Young  is a 85 y.o. female with a known history of COPD, hypertension, diabetes and dementia.    The patient is being admitted for severe hyponatremia and acute renal failure due to dehydration.  I discussed with the patient's nephew about her current condition, very poor prognosis and CODE STATUS.  Her nephew said that the patient has no health power of attorney.  She cannot decide the patient's CODE STATUS for now.  She will call her father, who is a brother for this patient and to discuss with him. Then the patient's nephew called back.  She said not her father is the patient's POA.  Her father side do not resuscitation or intubation or extreme measures if the patient has cardiopulmonary arrest. Goals of Care Determinations: Palliative care or hospice care. Plan:  Code Status: DNR. Time spent discussing advance care planning: 25 minutes.

## 2019-02-23 NOTE — ED Provider Notes (Signed)
[facility] Emergency Department Provider Note  ____________________________________________   None    (approximate)  I have reviewed the triage vital signs and the nursing notes.   HISTORY  Chief Complaint Weakness and Urinary Tract Infection   HPI Savannah Young is a 85 y.o. female who presents to the emergency department for evaluation of weakness, decreased appetite, and concern for UTI. She is a resident of [facility] home.     Past Medical History:  Diagnosis Date  . COPD (chronic obstructive pulmonary disease) ([place])   . Dementia ([place])   . Diabetes mellitus ([place])   . Hypertension     Patient Active Problem List   Diagnosis Date Noted  . COPD (chronic obstructive pulmonary disease) ([place]) 12/14/2018  . Closed right hip fracture ([place]) 12/14/2018  . Syncope 12/06/2016  . Iron deficiency anemia due to chronic blood loss 11/27/2016  . Encounter for blood transfusion 10/16/2016  . Abnormal findings on esophagogastroduodenoscopy (EGD) 10/16/2016  . Angiodysplasia of stomach and duodenum with bleeding 10/16/2016  . Essential thrombocytosis ([place]) 10/16/2016  . SVT (supraventricular tachycardia) ([place]) 10/16/2016  . Iron deficiency anemia 10/14/2016  . Thrombocythemia ([place]) 10/14/2016  . TIA (transient ischemic attack) 10/14/2016  . Encephalopathy acute 03/07/2016  . Pressure ulcer 02/23/2016  . Confusion 02/22/2016  . Agitation 01/20/2016  . Overactive bladder 02/06/2015  . Dementia ([place]) 02/06/2014  . Type 2 diabetes mellitus without complication ([place]) 40/98/1191  . Increased frequency of urination 01/07/2013    Past Surgical History:  Procedure Laterality Date  . ESOPHAGOGASTRODUODENOSCOPY (EGD) WITH PROPOFOL N/A 10/16/2016   Procedure: ESOPHAGOGASTRODUODENOSCOPY (EGD) WITH PROPOFOL;  Surgeon: San Jetty, MD;  Location: [facility] ENDOSCOPY;  Service: Gastroenterology;  Laterality: N/A;  . HIP ARTHROPLASTY Right 12/15/2018   Procedure: ARTHROPLASTY BIPOLAR HIP (HEMIARTHROPLASTY);  Surgeon: Thornton Park, MD;  Location: [facility] ORS;  Service: Orthopedics;  Laterality: Right;    Prior to Admission medications   Medication Sig Start Date End Date Taking? Authorizing Provider  acetaminophen (TYLENOL) 325 MG tablet Take 650 mg by mouth every 4 (four) hours as needed for mild pain, fever or headache.   Yes [provider]  alum & mag hydroxide-simeth (MINTOX) 200-200-20 MG/5ML suspension Take 30 mLs by mouth daily as needed for indigestion or heartburn.   Yes [provider]  aspirin 81 MG chewable tablet Chew 81 mg by mouth daily.   Yes [provider]  atorvastatin (LIPITOR) 20 MG tablet Take 1 tablet (20 mg total) by mouth daily. 02/24/16  Yes Theodoro Grist, MD  bismuth subsalicylate (PEPTO BISMOL) 262 MG/15ML suspension Take 15 mLs by mouth every 2 (two) hours as needed (nausea/vomiting - max 6 doses in 24 hours).   Yes [provider]  docusate sodium (COLACE) 100 MG capsule Take 1 capsule (100 mg total) by mouth 2 (two) times daily as needed for mild constipation. 10/16/16  Yes Theodoro Grist, MD  ferrous gluconate (FERGON) 240 (27 FE) MG tablet Take 240 mg by mouth daily with breakfast.    Yes [provider]  fluticasone (FLONASE) 50 MCG/ACT nasal spray Place 2 sprays into both nostrils daily.   Yes [provider]  hydroxyurea (HYDREA) 500 MG capsule Take 1 capsule (500 mg total) by mouth daily. May take with food to minimize GI side effects. 04/04/17  Yes Lloyd Huger, MD  loperamide (IMODIUM) 2 MG capsule Take 2 mg by mouth as needed for diarrhea or loose stools (max 4 doses in 24 hours).  Yes [provider]  loratadine (CLARITIN) 10 MG tablet Take 10 mg by mouth daily.   Yes [provider]  LORazepam (ATIVAN) 0.5 MG tablet Take 0.5 mg by mouth daily. At [facility]   Yes [provider]  LORazepam (ATIVAN) 0.5 MG tablet Take 0.5 mg by  mouth every 6 (six) hours as needed for anxiety.    Yes [provider]  magnesium hydroxide (MILK OF MAGNESIA) 400 MG/5ML suspension Take 30 mLs by mouth daily as needed for moderate constipation.   Yes [provider]  Melatonin 3 MG TABS Take 3 mg by mouth at bedtime.    Yes [provider]  memantine (NAMENDA XR) 28 MG CP24 24 hr capsule Take 28 mg by mouth at bedtime.   Yes [provider]  metoprolol tartrate (LOPRESSOR) 25 MG tablet Take 1 tablet (25 mg total) by mouth 2 (two) times daily. 10/16/16  Yes Theodoro Grist, MD  mirtazapine (REMERON) 15 MG tablet Take 7.5 mg by mouth at bedtime.    Yes [provider]  pantoprazole (PROTONIX) 40 MG tablet Take 1 tablet (40 mg total) by mouth daily. 10/16/16  Yes Theodoro Grist, MD  Phenylephrine-DM-GG (ROBITUSSIN TO GO COUGH/COLD CF) 5-10-100 MG/5ML LIQD Take 10 mLs by mouth every 6 (six) hours as needed (cough).   Yes [provider]  polyethylene glycol (MIRALAX / GLYCOLAX) 17 g packet Take 17 g by mouth daily as needed for mild constipation. Patient taking differently: Take 17 g by mouth daily.  12/18/18  Yes Henreitta Leber, MD  QUEtiapine (SEROQUEL) 25 MG tablet Take 0.5 tablets (12.5 mg total) by mouth daily before supper. Patient taking differently: Take 12.5 mg by mouth daily.  10/16/16  Yes Theodoro Grist, MD  QUEtiapine (SEROQUEL) 25 MG tablet Take 25 mg by mouth at bedtime.   Yes [provider]  sodium phosphate (FLEET) 7-19 GM/118ML ENEM Place 1 enema rectally daily as needed for severe constipation.   Yes [provider]  traMADol (ULTRAM) 50 MG tablet Take 1 tablet (50 mg total) by mouth every 6 (six) hours as needed. 12/18/18  Yes Henreitta Leber, MD  traZODone (DESYREL) 50 MG tablet Take 12.5 mg by mouth every 4 (four) hours as needed (agitation).    Yes [provider]  traZODone (DESYREL) 50 MG tablet Take 25 mg by mouth See admin instructions. Take   tablet (25mg ) by mouth 30 minutes before showering   Yes [provider]  enoxaparin (LOVENOX) 30 MG/0.3ML injection Inject 0.3 mLs (30 mg total) into the skin every 12 (twelve) hours for 14 days. 12/18/18 01/01/19  Henreitta Leber, MD    Allergies Patient has no known allergies.  Family History  Problem Relation Age of Onset  . Hypertension Other     Social History Social History   Tobacco Use  . Smoking status: Never Smoker  . Smokeless tobacco: Former Network engineer Use Topics  . Alcohol use: No  . Drug use: No    Review of Systems Level 5 caveat-altered mental status/dementia. ____________________________________________   PHYSICAL EXAM:  VITAL SIGNS: ED Triage Vitals  Enc Vitals Group     BP 02/23/19 1551 (!) 119/100     Pulse Rate 02/23/19 1551 86     Resp 02/23/19 1551 20     Temp 02/23/19 1551 (!) 97.5 F (36.4 C)     Temp Source 02/23/19 1551 Oral     SpO2 02/23/19 1549 98 %  Weight --      Height --      Head Circumference --      Peak Flow --      Pain Score 02/23/19 1552 0     Pain Loc --      Pain Edu? --      Excl. in [place]? --     Constitutional: Alert and oriented. Well appearing and in no acute distress. Eyes: Conjunctivae are normal. PERRL. EOMI. Head: Atraumatic. Nose: No congestion/rhinnorhea. Mouth/Throat: Mucous membranes are moist.  Oropharynx non-erythematous. Neck: No stridor.   Cardiovascular: Normal rate, regular rhythm. Grossly normal heart sounds.  Good peripheral circulation. Respiratory: Normal respiratory effort.  No retractions. Lungs CTAB. Gastrointestinal: Soft and nontender. No distention. No abdominal bruits. No CVA tenderness. Musculoskeletal: No lower extremity tenderness nor edema.  No joint effusions. Neurologic:  Normal speech and language. No gross focal neurologic deficits are appreciated. No gait instability. Skin:  Skin is warm, dry and intact. No rash noted. Psychiatric: Mood and affect are normal.  Speech and behavior are normal.  ____________________________________________   LABS (all labs ordered are listed, but only abnormal results are displayed)  Labs Reviewed  COMPREHENSIVE METABOLIC PANEL - Abnormal; Notable for the following components:      Result Value   Sodium 167 (*)    Chloride >130 (*)    Glucose, Bld 130 (*)    BUN 68 (*)    Creatinine, Ser 1.57 (*)    GFR calc non Af Amer 30 (*)    GFR calc Af Amer 34 (*)    All other components within normal limits  CBC WITH DIFFERENTIAL/PLATELET - Abnormal; Notable for the following components:   WBC 11.7 (*)    MCHC 29.6 (*)    RDW 17.8 (*)    Platelets 1,034 (*)    Neutro Abs 9.6 (*)    All other components within normal limits  URINALYSIS, COMPLETE (UACMP) WITH MICROSCOPIC - Abnormal; Notable for the following components:   Color, Urine YELLOW (*)    APPearance HAZY (*)    Bacteria, UA MANY (*)    All other components within normal limits  URINE CULTURE  NOVEL CORONAVIRUS, NAA (HOSPITAL ORDER, SEND-OUT TO REF LAB)  TROPONIN I (HIGH SENSITIVITY)  TROPONIN I (HIGH SENSITIVITY)  TROPONIN I (HIGH SENSITIVITY)  PATHOLOGIST SMEAR REVIEW  CBG MONITORING, ED   ____________________________________________  EKG  ED ECG REPORT I, Zebadiah Willert, FNP-BC personally viewed and interpreted this ECG.   Date: 02/23/2019  EKG Time: 2009  Rate: 90  Rhythm: normal EKG, normal sinus rhythm  Axis: normal  Intervals:none  ST&T Change: no ST elevation  ____________________________________________  RADIOLOGY  ED MD interpretation: CT head is negative for acute findings per radiology.  Official radiology report(s): Ct Head Wo Contrast  Result Date: 02/23/2019 CLINICAL DATA:  Weakness. EXAM: CT HEAD WITHOUT CONTRAST TECHNIQUE: Contiguous axial images were obtained from the base of the skull through the vertex without intravenous contrast. COMPARISON:  12/14/2018 FINDINGS: Brain: There is no evidence of acute infarct,  intracranial hemorrhage, mass, midline shift, or extra-axial fluid collection. Cerebral atrophy is unchanged. Cerebral white matter hypodensities are unchanged and nonspecific but compatible with minimal chronic small vessel ischemic disease. Vascular: Calcified atherosclerosis at the skull base. No hyperdense vessel. Skull: No fracture or focal osseous lesion. Sinuses/Orbits: Visualized paranasal sinuses and mastoid air cells are clear. Visualized orbits are unremarkable. Other: None. IMPRESSION: No evidence of acute intracranial abnormality. Electronically Signed   By: Logan Bores  M.D.   On: 02/23/2019 18:09    ____________________________________________   PROCEDURES  Procedure(s) performed: None  Procedures  Critical Care performed: No  ____________________________________________   INITIAL IMPRESSION / ASSESSMENT AND PLAN / ED COURSE     85 year old female presenting to the emergency department for treatment and evaluation of increasing weakness.  She lives at [facility] and the staff there feels as if she could potentially have a urinary tract infection.  The patient is pleasantly confused and unable to state her name, place, or time.  CT scan of her head is reassuring.  Urinalysis does not show any conclusive evidence of urinary tract infection.  There is some bacteria, but there is no leukocytes, hemoglobin, white blood cells, or protein.  Urine culture has been requested.  Patient is noted to be hypernatremic at 167 which seems somewhat chronic, however I do not have record of her levels being as high as they are today.  She also has thrombocytopenia which is baseline as well.  Platelet count is 1034.  Additionally, her BUN is 68 and creatinine is 1.57 with a GFR of 34 which is not her baseline.  Her last blood draw 2 months ago GFR was greater than 60 with a normal BUN and creatinine.  For the hypernatremia with acute renal failure, she was started on D5W at 1.35  mL/h.  Plan will be to admit her for further evaluation.  Hospitalist service has been paged.  Spoke with Dr. Bridgett Larsson regarding the patient, he is concerned about the most recent documented blood pressure of 86/65.  I feel like this is an erroneous reading as the patient has not been hypotensive since her arrival here.  Recheck of the blood pressure is now 110/76.  Also discussed this with the ED attending.  I will give her a gentle fluid bolus of 250 mL.  ____________________________________________   FINAL CLINICAL IMPRESSION(S) / ED DIAGNOSES  Final diagnoses:  Dehydration  Hypernatremia  Thrombocytopenia [facility])     ED Discharge Orders    None       Note:  This document was prepared using Dragon voice recognition software and may include unintentional dictation errors.    Victorino Dike, FNP 02/23/19 2103    Arta Silence, MD 02/24/19 [phone]

## 2019-02-24 LAB — GLUCOSE, CAPILLARY
Glucose-Capillary: 145 mg/dL — ABNORMAL HIGH (ref 70–99)
Glucose-Capillary: 112 mg/dL — ABNORMAL HIGH (ref 70–99)
Glucose-Capillary: 98 mg/dL (ref 70–99)
Glucose-Capillary: 121 mg/dL — ABNORMAL HIGH (ref 70–99)

## 2019-02-24 LAB — BASIC METABOLIC PANEL
Anion gap: 7 (ref 5–15)
BUN: 54 mg/dL — ABNORMAL HIGH (ref 8–23)
CO2: 26 mmol/L (ref 22–32)
Calcium: 8.1 mg/dL — ABNORMAL LOW (ref 8.9–10.3)
Chloride: 127 mmol/L — ABNORMAL HIGH (ref 98–111)
Creatinine, Ser: 1.39 mg/dL — ABNORMAL HIGH (ref 0.44–1.00)
GFR calc Af Amer: 40 mL/min — ABNORMAL LOW (ref >60)
GFR calc non Af Amer: 34 mL/min — ABNORMAL LOW (ref >60)
Glucose, Bld: 205 mg/dL — ABNORMAL HIGH (ref 70–99)
Potassium: 3.5 mmol/L (ref 3.5–5.1)
Sodium: 160 mmol/L — ABNORMAL HIGH (ref 135–145)

## 2019-02-24 LAB — CBC
HCT: 37.4 % (ref 36.0–46.0)
Hemoglobin: 11 g/dL — ABNORMAL LOW (ref 12.0–15.0)
MCH: 28.5 pg (ref 26.0–34.0)
MCHC: 29.4 g/dL — ABNORMAL LOW (ref 30.0–36.0)
MCV: 96.9 fL (ref 80.0–100.0)
Platelets: 772 10*3/uL — ABNORMAL HIGH (ref 150–400)
RBC: 3.86 MIL/uL — ABNORMAL LOW (ref 3.87–5.11)
RDW: 17.3 % — ABNORMAL HIGH (ref 11.5–15.5)
WBC: 8.4 10*3/uL (ref 4.0–10.5)
nRBC: 0 % (ref 0.0–0.2)

## 2019-02-24 LAB — MRSA PCR SCREENING: MRSA by PCR: NEGATIVE

## 2019-02-24 LAB — SARS CORONAVIRUS 2 BY RT PCR (HOSPITAL ORDER, PERFORMED IN ~~LOC~~ HOSPITAL LAB): SARS Coronavirus 2: NEGATIVE

## 2019-02-24 MED ORDER — METOPROLOL TARTRATE 25 MG PO TABS
25.0000 mg | ORAL_TABLET | Freq: Two times a day (BID) | ORAL | Status: DC
  Administered 2019-02-24 – 2019-02-26 (×5): 25 mg via ORAL
  Filled 2019-02-24 (×5): qty 1

## 2019-02-24 NOTE — Progress Notes (Signed)
[facility] [place] NAME: Savannah Young    MR#:  [id number]  DATE OF BIRTH:  [birth year]  SUBJECTIVE:  Patient here from [facility] with weakness and decreased appetite.  REVIEW OF SYSTEMS:    Unable to obtain due to dementia    Tolerating Diet: yes      DRUG ALLERGIES:  No Known Allergies  VITALS:  Blood pressure (!) 114/51, pulse 71, temperature 97.6 F (36.4 C), resp. rate 18, height 5\' 7"  (1.702 m), weight 36.4 kg, last menstrual period 01/20/2016, SpO2 100 %.  PHYSICAL EXAMINATION:  Constitutional: Appears well-developed and well-nourished. No distress. HENT: Normocephalic. Marland Kitchen Oropharynx is clear and moist.  Eyes: Conjunctivae and EOM are normal. PERRLA, no scleral icterus.  Neck: Normal ROM. Neck supple. No JVD. No tracheal deviation. CVS: RRR, S1/S2 +, no murmurs, no gallops, no carotid bruit.  Pulmonary: Effort and breath sounds normal, no stridor, rhonchi, wheezes, rales.  Abdominal: Soft. BS +,  no distension, tenderness, rebound or guarding.  Musculoskeletal: Normal range of motion. No edema and no tenderness.  Neuro: Lethargic no obvious focal deficit   skin: Skin is warm and dry. No rash noted. Psychiatric: Dementia, confused   LABORATORY PANEL:   CBC Recent Labs  Lab 02/24/19 0446  WBC 8.4  HGB 11.0*  HCT 37.4  PLT 772*   ------------------------------------------------------------------------------------------------------------------  Chemistries  Recent Labs  Lab 02/23/19 1617 02/24/19 0446  NA 167* 160*  K 4.2 3.5  CL >130* 127*  CO2 26 26  GLUCOSE 130* 205*  BUN 68* 54*  CREATININE 1.57* 1.39*  CALCIUM 9.3 8.1*  AST 30  --   ALT 17  --   ALKPHOS 110  --   BILITOT 0.6  --    ------------------------------------------------------------------------------------------------------------------  Cardiac Enzymes No results for input(s): TROPONINI in the last 168  hours. ------------------------------------------------------------------------------------------------------------------  RADIOLOGY:  Ct Head Wo Contrast  Result Date: 02/23/2019 CLINICAL DATA:  Weakness. EXAM: CT HEAD WITHOUT CONTRAST TECHNIQUE: Contiguous axial images were obtained from the base of the skull through the vertex without intravenous contrast. COMPARISON:  12/14/2018 FINDINGS: Brain: There is no evidence of acute infarct, intracranial hemorrhage, mass, midline shift, or extra-axial fluid collection. Cerebral atrophy is unchanged. Cerebral white matter hypodensities are unchanged and nonspecific but compatible with minimal chronic small vessel ischemic disease. Vascular: Calcified atherosclerosis at the skull base. No hyperdense vessel. Skull: No fracture or focal osseous lesion. Sinuses/Orbits: Visualized paranasal sinuses and mastoid air cells are clear. Visualized orbits are unremarkable. Other: None. IMPRESSION: No evidence of acute intracranial abnormality. Electronically Signed   By: Logan Bores M.D.   On: 02/23/2019 18:09     ASSESSMENT AND PLAN:   85 year old female with severe dementia who presented from [facility] with generalized weakness and found to have severe hypernatremia.  1.  Hyponatremia due to underlying dementia and poor p.o. intake: Continue D5 sodium level has improved Continue to monitor sodium level   2.  Dementia: Continue Namenda, Seroquel and Remeron 3.  Essential hypertension: Continue metoprolol 4.  Acute kidney injury in the setting of poor p.o. intake which is improved with IV fluids   Palliative care consultation for Monday.    CODE STATUS: dnr  TOTAL TIME TAKING CARE OF THIS PATIENT: 27 minutes.     POSSIBLE D/C 1-2 days, DEPENDING ON CLINICAL CONDITION.   Bettey Costa M.D on 02/24/2019 at 10:06 AM  Between 7am to 6pm - Pager - [phone] After 6pm go to [email or web address] -  password [email or web address] [facility]  Office   [phone]  CC: Primary care physician; Raelyn Number, MD  Note: This dictation was prepared with Dragon dictation along with smaller phrase technology. Any transcriptional errors that result from this process are unintentional.

## 2019-02-25 DIAGNOSIS — Z7189 Other specified counseling: Secondary | ICD-10-CM

## 2019-02-25 DIAGNOSIS — E860 Dehydration: Secondary | ICD-10-CM

## 2019-02-25 DIAGNOSIS — D696 Thrombocytopenia, unspecified: Secondary | ICD-10-CM

## 2019-02-25 DIAGNOSIS — Z66 Do not resuscitate: Secondary | ICD-10-CM

## 2019-02-25 DIAGNOSIS — E871 Hypo-osmolality and hyponatremia: Secondary | ICD-10-CM

## 2019-02-25 DIAGNOSIS — E43 Unspecified severe protein-calorie malnutrition: Secondary | ICD-10-CM

## 2019-02-25 DIAGNOSIS — Z515 Encounter for palliative care: Secondary | ICD-10-CM

## 2019-02-25 DIAGNOSIS — E870 Hyperosmolality and hypernatremia: Secondary | ICD-10-CM | POA: Diagnosis present

## 2019-02-25 LAB — BASIC METABOLIC PANEL
Anion gap: 7 (ref 5–15)
BUN: 29 mg/dL — ABNORMAL HIGH (ref 8–23)
CO2: 24 mmol/L (ref 22–32)
Calcium: 7.9 mg/dL — ABNORMAL LOW (ref 8.9–10.3)
Chloride: 117 mmol/L — ABNORMAL HIGH (ref 98–111)
Creatinine, Ser: 1.11 mg/dL — ABNORMAL HIGH (ref 0.44–1.00)
GFR calc Af Amer: 52 mL/min — ABNORMAL LOW (ref >60)
GFR calc non Af Amer: 45 mL/min — ABNORMAL LOW (ref >60)
Glucose, Bld: 84 mg/dL (ref 70–99)
Potassium: 3.3 mmol/L — ABNORMAL LOW (ref 3.5–5.1)
Sodium: 148 mmol/L — ABNORMAL HIGH (ref 135–145)

## 2019-02-25 LAB — URINE CULTURE: Culture: NO GROWTH

## 2019-02-25 LAB — PATHOLOGIST SMEAR REVIEW

## 2019-02-25 LAB — GLUCOSE, CAPILLARY
Glucose-Capillary: 78 mg/dL (ref 70–99)
Glucose-Capillary: 109 mg/dL — ABNORMAL HIGH (ref 70–99)
Glucose-Capillary: 153 mg/dL — ABNORMAL HIGH (ref 70–99)
Glucose-Capillary: 89 mg/dL (ref 70–99)

## 2019-02-25 MED ORDER — NYSTATIN 100000 UNIT/ML MT SUSP
5.0000 mL | Freq: Four times a day (QID) | OROMUCOSAL | Status: DC
  Administered 2019-02-25 – 2019-02-26 (×6): 500000 [IU] via OROMUCOSAL
  Filled 2019-02-25 (×6): qty 5

## 2019-02-25 MED ORDER — ENSURE ENLIVE PO LIQD
237.0000 mL | Freq: Three times a day (TID) | ORAL | Status: DC
  Administered 2019-02-25 – 2019-02-26 (×2): 237 mL via ORAL

## 2019-02-25 MED ORDER — POTASSIUM CHLORIDE IN NACL 20-0.45 MEQ/L-% IV SOLN
INTRAVENOUS | Status: DC
  Administered 2019-02-25: 11:00:00 via INTRAVENOUS
  Filled 2019-02-25 (×4): qty 1000

## 2019-02-25 NOTE — Progress Notes (Signed)
[facility] [place] NAME: Savannah Young    MR#:  [id number]  DATE OF BIRTH:  [birth year]  SUBJECTIVE:  Patient here from [facility] with weakness and decreased appetite.  Confused.  Poor oral intake.  REVIEW OF SYSTEMS:    Unable to obtain due to dementia  DRUG ALLERGIES:  No Known Allergies  VITALS:  Blood pressure 114/77, pulse 72, temperature 98 F (36.7 C), temperature source Axillary, resp. rate 17, height 5\' 7"  (1.702 m), weight 36.4 kg, last menstrual period 01/20/2016, SpO2 100 %.  PHYSICAL EXAMINATION:  Constitutional: Appears well-developed and well-nourished. No distress. HENT: Normocephalic. Marland Kitchen Oropharynx-thrush Eyes: Conjunctivae and EOM are normal. PERRLA, no scleral icterus.  Neck: Normal ROM. Neck supple. No JVD. No tracheal deviation. CVS: RRR, S1/S2 +, no murmurs, no gallops, no carotid bruit.  Pulmonary: Effort and breath sounds normal, no stridor, rhonchi, wheezes, rales.  Abdominal: Soft. BS +,  no distension, tenderness, rebound or guarding.  Musculoskeletal: Normal range of motion. No edema and no tenderness.  Neuro: Lethargic no obvious focal deficit  skin: Skin is warm and dry. No rash noted. Psychiatric: Dementia, confused   LABORATORY PANEL:   CBC Recent Labs  Lab 02/24/19 0446  WBC 8.4  HGB 11.0*  HCT 37.4  PLT 772*   ------------------------------------------------------------------------------------------------------------------  Chemistries  Recent Labs  Lab 02/23/19 1617  02/25/19 0446  NA 167*   < > 148*  K 4.2   < > 3.3*  CL >130*   < > 117*  CO2 26   < > 24  GLUCOSE 130*   < > 84  BUN 68*   < > 29*  CREATININE 1.57*   < > 1.11*  CALCIUM 9.3   < > 7.9*  AST 30  --   --   ALT 17  --   --   ALKPHOS 110  --   --   BILITOT 0.6  --   --    < > = values in this interval not displayed.    ------------------------------------------------------------------------------------------------------------------  Cardiac Enzymes No results for input(s): TROPONINI in the last 168 hours. ------------------------------------------------------------------------------------------------------------------  RADIOLOGY:  Ct Head Wo Contrast  Result Date: 02/23/2019 CLINICAL DATA:  Weakness. EXAM: CT HEAD WITHOUT CONTRAST TECHNIQUE: Contiguous axial images were obtained from the base of the skull through the vertex without intravenous contrast. COMPARISON:  12/14/2018 FINDINGS: Brain: There is no evidence of acute infarct, intracranial hemorrhage, mass, midline shift, or extra-axial fluid collection. Cerebral atrophy is unchanged. Cerebral white matter hypodensities are unchanged and nonspecific but compatible with minimal chronic small vessel ischemic disease. Vascular: Calcified atherosclerosis at the skull base. No hyperdense vessel. Skull: No fracture or focal osseous lesion. Sinuses/Orbits: Visualized paranasal sinuses and mastoid air cells are clear. Visualized orbits are unremarkable. Other: None. IMPRESSION: No evidence of acute intracranial abnormality. Electronically Signed   By: Logan Bores M.D.   On: 02/23/2019 18:09     ASSESSMENT AND PLAN:   85 year old female with severe dementia who presented from [place] with generalized weakness and found to have severe hypernatremia.  * Hypernatremia with severe dehydration secondary to poor oral intake in setting of dementia Patient was on D5 and sodium has improved but still hypernatremic. Will start half-normal saline. Repeat sodium levels in the morning  *Acute kidney injury.  Improving with IV fluids.  Monitor input and output.   *  Dementia: Continue Namenda, Seroquel and Remeron  *  Essential hypertension: Continue metoprolol  Palliative  care consulted.  Although patient seems to be improving with her lab work at this time.   Will likely recur within a few days after discharge.  Would benefit from hospice.  CODE STATUS: DNR  TOTAL TIME TAKING CARE OF THIS PATIENT: 35 minutes.   POSSIBLE D/C 1-2 days, DEPENDING ON CLINICAL CONDITION.  Savannah Young M.D on 02/25/2019 at 12:50 PM  Between 7am to 6pm - Pager - [phone]  After 6pm go to [email or web address] - password [email or web address] [facility]  Office  [phone]  CC: Primary care physician; Raelyn Number, MD  Note: This dictation was prepared with Dragon dictation along with smaller phrase technology. Any transcriptional errors that result from this process are unintentional.

## 2019-02-25 NOTE — TOC Initial Note (Signed)
Transition of Care [facility]) - Initial/Assessment Note    Patient Details  Name: Savannah Young MRN: [id number] Date of Birth: 1932-07-17  [facility] CM/SW Contact:    Emmakate Hypes, Lenice Llamas Phone Number: [phone]  02/25/2019, 4:44 PM  Clinical Narrative: Patient is from [place]. Per [facility] administrator patient does not walk and uses a wheel chair. Per Thayer Headings patient is on room air. Per Thayer Headings patient's brother is her guardian however he has dementia so patient's niece Mardene Celeste makes decisions for patient. Per Thayer Headings patient can return to [facility]. CSW attempted to meet with patient however she was not alert and oriented.               Expected Discharge Plan: Assisted Living Barriers to Discharge: Continued Medical Work up   Patient Goals and CMS Choice        Expected Discharge Plan and Services Expected Discharge Plan: Assisted Living In-house Referral: Clinical Social Work Discharge Planning Services: CM Consult   Living arrangements for the past 2 months: [place]                                      Prior Living Arrangements/Services Living arrangements for the past 2 months: [place] Lives with:: Facility Resident Patient language and need for interpreter reviewed:: No        Need for Family Participation in Patient Care: Yes (Comment) Care giver support system in place?: Yes (comment) Current home services: DME(uses wheel chair at ALF) Criminal Activity/Legal Involvement Pertinent to Current Situation/Hospitalization: No - Comment as needed  Activities of Daily Living Home Assistive Devices/Equipment: None ADL Screening (condition at time of admission) Patient's cognitive ability adequate to safely complete daily activities?: No Is the patient deaf or have difficulty hearing?: No Independently performs ADLs?: No Communication: Dependent Is this a change  from baseline?: Change from baseline, expected to last >3 days Dressing (OT): Dependent Grooming: Dependent Feeding: Dependent Bathing: Dependent Toileting: Dependent In/Out Bed: Dependent Walks in Home: Dependent Is this a change from baseline?: Change from baseline, expected to last >3 days Does the patient have difficulty walking or climbing stairs?: Yes Weakness of Legs: None Weakness of Arms/Hands: None  Permission Sought/Granted                  Emotional Assessment Appearance:: Appears stated age     Orientation: : Oriented to Self, Fluctuating Orientation (Suspected and/or reported Sundowners) Alcohol / Substance Use: Not Applicable Psych Involvement: No (comment)  Admission diagnosis:  Dehydration [E86.0] Hypernatremia [E87.0] Thrombocytopenia ([place]) [D69.6] Patient Active Problem List   Diagnosis Date Noted  . Hypernatremia 02/25/2019  . COPD (chronic obstructive pulmonary disease) ([place]) 12/14/2018  . Closed right hip fracture ([place]) 12/14/2018  . Syncope 12/06/2016  . Iron deficiency anemia due to chronic blood loss 11/27/2016  . Encounter for blood transfusion 10/16/2016  . Angiodysplasia of stomach and duodenum with bleeding 10/16/2016  . Essential thrombocytosis ([place]) 10/16/2016  . SVT (supraventricular tachycardia) ([place]) 10/16/2016  . Iron deficiency anemia 10/14/2016  . Thrombocythemia ([place]) 10/14/2016  . TIA (transient ischemic attack) 10/14/2016  . Encephalopathy acute 03/07/2016  . Pressure ulcer 02/23/2016  . Confusion 02/22/2016  . Agitation 01/20/2016  . Overactive bladder 02/06/2015  . Dementia ([place]) 02/06/2014  . Type 2 diabetes mellitus without complication ([place]) 32/67/1245  . Increased  frequency of urination 01/07/2013   PCP:  Raelyn Number, MD Pharmacy:   [facility] [place] [address] [place] [address] Phone: [phone] Fax: [phone]     Social Determinants of Health  (SDOH) Interventions    Readmission Risk Interventions No flowsheet data found.

## 2019-02-25 NOTE — Consult Note (Signed)
Consultation Note Date: 02/25/2019   Patient Name: Savannah Young  DOB: 11-Sep-1931  MRN: [id number]  Age / Sex: 85 y.o., female   PCP: Raelyn Number, MD Referring Physician: Hillary Bow, MD   REASON FOR CONSULTATION:Establishing goals of care  Palliative Care consult requested for this 85 y.o. female with multiple medical problems including COPD, diabetes,JAK-2 positive essential thrombocytosis, hypertension, and dementia. She presented to ED from [facility] with complaits of decreased appetite, generalized weakness, and increased confusion. Since admission patient has been treated for suspected UTI and severe hypernatremia (sodium 167). Palliative Medicine team consulted for goals of care discussion.   Clinical Assessment and Goals of Care: I have reviewed medical records including lab results, imaging, Epic notes, and MAR, received report from the bedside RN, and assessed the patient. I spoke with patient's brother, Melanie Crazier [facility]) and patient's niece Shary Key via phone due to COVID-19 restrictions. We discussed diagnosis prognosis, GOC, EOL wishes, disposition and options. Patient is confused, sleeping, will awaken to voice commands with limited verbal response. Would not follow commands.   I introduced Palliative Medicine as specialized medical care for people living with serious illness. It focuses on providing relief from the symptoms and stress of a serious illness. The goal is to improve quality of life for both the patient and the family.  We discussed a brief life review of the patient, along with her functional and nutritional status. Family reports patient has no biological children. She has been widowed for many years. She has 4 siblings. She is retired from [employer] in [place] were she spent most of her life before returning to [place]. She was a strong Regulatory affairs officer, involved in church Dance movement psychotherapist), and enjoyed playing the piano and spending time with friends  and family.   Family reports due to her continuous health decline and worsening dementia she was placed at [facility]. She has been there for several years now. Family reports some falls in the past with hip fracture. Family reports noticeable worsening of her dementia, decrease in appetite, and that she seemed to be more quiet than her normal self over the past few months. They were concerned of not seeing her due to COVID-19.   We discussed Her current illness and what it means in the larger context of Her on-going co-morbidities. With specific discussions regarding her hypernatremia, severe dehydration, worsening dementia, acute renal failure, and poor appetite.  Natural disease trajectory and expectations at EOL were discussed.  Family verbalized understanding of patient's illness and continued signs of decline. Her brother reports "I knew she was getting worst, and I worried how much longer she would last!" Support provided. Niece reports they are concerned that she is not eating or drinking much and remained hopeful that she would improve, however, knowing how dehydrated and malnutrition she is makes them more concerned about her actual quality of life at this point. We discussed in details trajectory of advanced dementia and recurrent patterns of dehydration due to poor nutrition.   Family states "she wouldn't want to keep coming back and forth to the hospital or living life with no meaning!" Brother states he does not want her suffering and he does not want to force her to eat or undergo medical treatments to attempt to fix her condition, knowing that she will still be the same days to weeks from now. Support provided.   I attempted to elicit values and goals of care important to the patient.  The difference between aggressive medical intervention and comfort care was considered in light of the patient's goals of care. I educated family on what comfort care measures would look like. I  discussed with them with comfort care, patient would no longer receive aggressive medical interventions such as continuous vital signs, lab work, radiology testing, or medications not focused on comfort. All care will focus on how the patient is looking and feeling. This will include management of any symptoms that may cause discomfort, pain, shortness of breath, cough, nausea, agitation, anxiety, and/or secretions etc. Symptoms will be managed with medications and other non-pharmacological interventions such as spiritual support if requested, repositioning, music therapy, or therapeutic listening. Family verbalized understanding and appreciation.   Patient does have a documented advanced directive. Melanie Crazier (patient's brother) is the listed POA. He confirms and also states his daughter Shary Key is his additional support and alternative POA for patient. Concepts specific to code status, artifical feeding and hydration, and rehospitalization were considered and discussed. Family confirms wishes for DNR/DNI. We discussed in detail patient's poor nutritional state and PEG tube. Brother would like to call his other siblings and discuss and states he will call back once they have made a decision regarding PEG if needed and also how to further proceed with care.   Hospice and Palliative Care services outpatient were explained and offered. Family verbalized their understanding and awareness of both palliative and hospice's goals and philosophy of care.   Questions and concerns were addressed.  The family was encouraged to call with questions or concerns.  PMT will continue to support holistically.  25: Brother and niece called back and shared that they have spoken to all of her siblings. They are not interested in any forms of artificial feedings. Family is considering hospice and full comfort. They would like to discontinue IV hydration for trial to see if patient is going to increase appetite and  show any signs of improvement via physically and by her lab work overnight. Brother states "I just want her to be comfortable and not suffer. If she is not going to get better, then I would rather her be comfortable and rest at peace!" Support provided. Niece on the phone also and expressed her agreement with her father's statement and shared all of the family feels the same. Family requesting to follow up tomorrow for further discussion and decisions.   SOCIAL HISTORY:     reports that she has never smoked. She has quit using smokeless tobacco. She reports that she does not drink alcohol or use drugs.  CODE STATUS: DNR  ADVANCE DIRECTIVES: OSCAR MILLER (BROTHER/POA) PATRICIA MILLER (NIECE)   SYMPTOM MANAGEMENT: per attending   Palliative Prophylaxis:   Aspiration, Bowel Regimen, Delirium Protocol, Frequent Pain Assessment, Oral Care and Turn Reposition  PSYCHO-SOCIAL/SPIRITUAL:  Support System: Family   Desire for further Chaplaincy support: Yes   Additional Recommendations (Limitations, Scope, Preferences):  Full Scope Treatment, DNR/DNI    PAST MEDICAL HISTORY: Past Medical History:  Diagnosis Date  . COPD (chronic obstructive pulmonary disease) ([place])   . Dementia ([place])   . Diabetes mellitus ([place])   . Hypertension     PAST SURGICAL HISTORY:  Past Surgical History:  Procedure Laterality Date  . ESOPHAGOGASTRODUODENOSCOPY (EGD) WITH PROPOFOL N/A 10/16/2016   Procedure: ESOPHAGOGASTRODUODENOSCOPY (EGD) WITH PROPOFOL;  Surgeon: San Jetty, MD;  Location: [facility] ENDOSCOPY;  Service: Gastroenterology;  Laterality: N/A;  . HIP ARTHROPLASTY Right 12/15/2018   Procedure: ARTHROPLASTY BIPOLAR HIP (HEMIARTHROPLASTY);  Surgeon: Mack Guise,  Lennette Bihari, MD;  Location: [facility] ORS;  Service: Orthopedics;  Laterality: Right;    ALLERGIES:  has No Known Allergies.   MEDICATIONS:  Current Facility-Administered Medications  Medication Dose Route Frequency Provider Last Rate Last Dose  . 0.9 %   sodium chloride infusion  250 mL Intravenous PRN Demetrios Loll, MD      . acetaminophen (TYLENOL) tablet 650 mg  650 mg Oral Q6H PRN Demetrios Loll, MD   650 mg at 02/25/19 4098   Or  . acetaminophen (TYLENOL) suppository 650 mg  650 mg Rectal Q6H PRN Demetrios Loll, MD      . albuterol (PROVENTIL) (2.5 MG/3ML) 0.083% nebulizer solution 2.5 mg  2.5 mg Nebulization Q2H PRN Demetrios Loll, MD      . alum & mag hydroxide-simeth (MAALOX/MYLANTA) 200-200-20 MG/5ML suspension 30 mL  30 mL Oral Daily PRN Demetrios Loll, MD      . aspirin chewable tablet 81 mg  81 mg Oral Daily Demetrios Loll, MD   81 mg at 02/25/19 0905  . bisacodyl (DULCOLAX) EC tablet 5 mg  5 mg Oral Daily PRN Demetrios Loll, MD   5 mg at 02/25/19 0905  . bismuth subsalicylate (PEPTO BISMOL) 262 MG/15ML suspension 15 mL  15 mL Oral Q2H PRN Demetrios Loll, MD      . docusate sodium (COLACE) capsule 100 mg  100 mg Oral BID PRN Demetrios Loll, MD      . feeding supplement (ENSURE ENLIVE) (ENSURE ENLIVE) liquid 237 mL  237 mL Oral TID WC Sudini, Srikar, MD      . fluticasone (FLONASE) 50 MCG/ACT nasal spray 2 spray  2 spray Each Nare Daily Demetrios Loll, MD   2 spray at 02/25/19 0910  . guaiFENesin-dextromethorphan (ROBITUSSIN DM) 100-10 MG/5ML syrup 10 mL  10 mL Oral Q6H PRN Demetrios Loll, MD      . heparin injection 5,000 Units  5,000 Units Subcutaneous Q8H Demetrios Loll, MD   5,000 Units at 02/25/19 1404  . HYDROcodone-acetaminophen (NORCO/VICODIN) 5-325 MG per tablet 1-2 tablet  1-2 tablet Oral Q4H PRN Demetrios Loll, MD      . hydroxyurea (HYDREA) capsule 500 mg  500 mg Oral Daily Demetrios Loll, MD   500 mg at 02/25/19 0905  . insulin aspart (novoLOG) injection 0-5 Units  0-5 Units Subcutaneous QHS Demetrios Loll, MD      . insulin aspart (novoLOG) injection 0-9 Units  0-9 Units Subcutaneous TID WC Demetrios Loll, MD   1 Units at 02/24/19 0935  . loperamide (IMODIUM) capsule 2 mg  2 mg Oral PRN Demetrios Loll, MD      . loratadine (CLARITIN) tablet 10 mg  10 mg Oral Daily Demetrios Loll, MD   10 mg  at 02/25/19 0905  . LORazepam (ATIVAN) tablet 0.5 mg  0.5 mg Oral Daily Demetrios Loll, MD   0.5 mg at 02/25/19 1404  . LORazepam (ATIVAN) tablet 0.5 mg  0.5 mg Oral Q6H PRN Demetrios Loll, MD      . memantine (NAMENDA XR) 24 hr capsule 28 mg  28 mg Oral QHS Demetrios Loll, MD   28 mg at 02/24/19 2151  . metoprolol tartrate (LOPRESSOR) tablet 25 mg  25 mg Oral BID Bettey Costa, MD   25 mg at 02/25/19 0905  . mirtazapine (REMERON) tablet 7.5 mg  7.5 mg Oral QHS Demetrios Loll, MD   7.5 mg at 02/24/19 2151  . nystatin (MYCOSTATIN) 100000 UNIT/ML suspension 500,000 Units  5 mL Mouth/Throat QID Hillary Bow, MD  500,000 Units at 02/25/19 1403  . ondansetron (ZOFRAN) tablet 4 mg  4 mg Oral Q6H PRN Demetrios Loll, MD       Or  . ondansetron [facility]) injection 4 mg  4 mg Intravenous Q6H PRN Demetrios Loll, MD      . pantoprazole (PROTONIX) EC tablet 40 mg  40 mg Oral Daily Demetrios Loll, MD   40 mg at 02/25/19 0905  . QUEtiapine (SEROQUEL) tablet 12.5 mg  12.5 mg Oral Daily Demetrios Loll, MD   12.5 mg at 02/25/19 0905  . QUEtiapine (SEROQUEL) tablet 25 mg  25 mg Oral QHS Demetrios Loll, MD   25 mg at 02/24/19 2152  . senna-docusate (Senokot-S) tablet 1 tablet  1 tablet Oral QHS PRN Demetrios Loll, MD      . sodium chloride flush (NS) 0.9 % injection 3 mL  3 mL Intravenous Q12H Demetrios Loll, MD   3 mL at 02/25/19 0926  . sodium chloride flush (NS) 0.9 % injection 3 mL  3 mL Intravenous PRN Demetrios Loll, MD      . sodium phosphate (FLEET) 7-19 GM/118ML enema 1 enema  1 enema Rectal Daily PRN Demetrios Loll, MD      . traZODone (DESYREL) tablet 25 mg  25 mg Oral Q4H PRN Demetrios Loll, MD        VITAL SIGNS: BP 114/77 (BP Location: Right Arm)   Pulse 72   Temp 98 F (36.7 C) (Axillary)   Resp 17   Ht 5\' 7"  (1.702 m)   Wt 36.4 kg   LMP 01/20/2016 (Approximate)   SpO2 100%   BMI 12.58 kg/m  Filed Weights   02/23/19 1552 02/23/19 2225  Weight: 45 kg 36.4 kg    Estimated body mass index is 12.58 kg/m as calculated from the following:    Height as of this encounter: 5\' 7"  (1.702 m).   Weight as of this encounter: 36.4 kg.  LABS: CBC:    Component Value Date/Time   WBC 8.4 02/24/2019 0446   HGB 11.0 (L) 02/24/2019 0446   HGB 13.2 05/31/2014 1610   HCT 37.4 02/24/2019 0446   HCT 42.0 05/31/2014 1610   PLT 772 (H) 02/24/2019 0446   PLT 498 (H) 05/31/2014 1610   Comprehensive Metabolic Panel:    Component Value Date/Time   NA 148 (H) 02/25/2019 0446   NA 141 05/31/2014 1610   K 3.3 (L) 02/25/2019 0446   K 3.9 05/31/2014 1610   CO2 24 02/25/2019 0446   CO2 29 05/31/2014 1610   BUN 29 (H) 02/25/2019 0446   BUN 12 05/31/2014 1610   CREATININE 1.11 (H) 02/25/2019 0446   CREATININE 1.07 05/31/2014 1610   ALBUMIN 3.8 02/23/2019 1617   ALBUMIN 3.1 (L) 05/31/2014 1610     Review of Systems  Unable to perform ROS: Dementia   Physical Exam General: NAD, chronically-ill appearing, thin Cardiovascular: regular rate and rhythm Pulmonary: clear ant fields Abdomen: soft, nontender, + bowel sounds Extremities: no edema  Skin: no rashes Neurological: Weakness, dementia, lethargic but arouse, will not follow commands   Prognosis: Poor in the setting of advanced dementia, hypernatremia, acute renal failure due to severe dehydration, severe protein calorie malnutrition, weight loss >10% weight 45.4 kg (11/2018) 36.4 kg (01/2019), COPD, diabetes, generalized weakness, hypertension, and hyperlipidemia.   Discharge Planning:  Hospice facility  Recommendations:  DNR/DNI-as confirmed by family  Continue to treat per plan of care with no escalation of care. Family would like to d/c IV fluids  and watchful waiting overnight and remainder of day. Requesting follow up tomorrow morning with lab updates etc. Leaning towards shifting care to full comfort and hospice for EOL support.   PMT will continue to support and follow    Palliative Performance Scale: PPS 10%              Family expressed understanding and was in  agreement with this plan.   The above conversation was completed via telephone due to the visitor restrictions during the COVID-19 pandemic. Thorough chart review and discussion with necessary members of the care team was completed as part of assessment.    Thank you for allowing the Palliative Medicine Team to assist in the care of this patient.  Time: 1445-1600 Time: 1650-1715 Time Total: 100 min   Visit consisted of counseling and education dealing with the complex and emotionally intense issues of symptom management and palliative care in the setting of serious and potentially life-threatening illness.Greater than 50%  of this time was spent counseling and coordinating care related to the above assessment and plan.  Signed by:  Alda Lea, AGPCNP-BC Palliative Medicine Team  Phone: [phone] Fax: [phone] Pager: [phone] Amion: Bjorn Pippin

## 2019-02-25 NOTE — Progress Notes (Signed)
Initial Nutrition Assessment   DOCUMENTATION CODES:   Severe malnutrition in context of chronic illness, Underweight  INTERVENTION:  Continue Ensure Enlive po TID, each supplement provides 350 kcal and 20 grams of protein.  NUTRITION DIAGNOSIS:   Severe Malnutrition related to chronic illness(COPD, dementia) as evidenced by severe fat depletion, severe muscle depletion.  GOAL:   Patient will meet greater than or equal to 90% of their needs  MONITOR:   PO intake, Supplement acceptance, Labs, Weight trends, Skin, I & O's  REASON FOR ASSESSMENT:   Other (Comment)(Low BMI)    ASSESSMENT:   85 year old female with PMHx of dementia, HTN, DM, COPD admitted with hypernatremia with severe dehydration secondary to poor oral intake, AKI.   -PMT has been consulted.  Patient unable to provide history in setting of advanced dementia. She was laying on her side with her eyes closed at time of RD assessment this AM. Per RN patient is edentulous so diet was downgraded to dysphagia 2. Per chart patient 48.7 kg on 05/04/2017. She has lost weight over the past 2 years. Suspect weight from 12/15/2018 was reported.  Medications reviewed and include: Novolog 0-9 units TID, Novolog 0-5 units QHS, Ativan, Remeron 7.5 mg QHS, pantoprazole, Seroquel 12.5 mg daily, Seroquel 25 mg QHS, 1/2NS with KCl 20 mEq/L at 100 mL/hr.  Labs reviewed: CBG 78-121, Sodium 148, Potassium 3.3, Chloride 117, BUN 29, Creatinine 1.11.  NUTRITION - FOCUSED PHYSICAL EXAM:    Most Recent Value  Orbital Region  Severe depletion  Upper Arm Region  Severe depletion  Thoracic and Lumbar Region  Unable to assess  Buccal Region  Severe depletion  Temple Region  Severe depletion  Clavicle Bone Region  Severe depletion  Clavicle and Acromion Bone Region  Severe depletion  Scapular Bone Region  Unable to assess  Dorsal Hand  Severe depletion  Patellar Region  Unable to assess  Anterior Thigh Region  Unable to assess  Posterior  Calf Region  Unable to assess  Edema (RD Assessment)  Unable to assess  Hair  Reviewed  Eyes  Unable to assess  Mouth  Unable to assess  Skin  Unable to assess  Nails  Reviewed     Diet Order:   Diet Order            DIET DYS 2 Room service appropriate? Yes; Fluid consistency: Thin  Diet effective now             EDUCATION NEEDS:   No education needs have been identified at this time  Skin:  Skin Assessment: Skin Integrity Issues:(stg II sacrum (4cm x 2cm))  Last BM:  Unknown  Height:   Ht Readings from Last 1 Encounters:  02/23/19 5\' 7"  (1.702 m)   Weight:   Wt Readings from Last 1 Encounters:  02/23/19 36.4 kg   Ideal Body Weight:  61.4 kg  BMI:  Body mass index is 12.58 kg/m.  Estimated Nutritional Needs:   Kcal:  1200-1400  Protein:  60-70 grams  Fluid:  1.2-1.4 L/day  Willey Blade, MS, RD, LDN Office: [phone] Pager: [phone] After Hours/Weekend Pager: [phone]

## 2019-02-25 NOTE — NC FL2 (Signed)
[place] LEVEL OF CARE SCREENING TOOL     IDENTIFICATION  Patient Name: Savannah Young Birthdate: [birth year] Sex: female Admission Date (Current Location): 02/23/2019  [place] and [place] Number:  Savannah Young [id number] [place] and Address:  [facility], [address] [place], [place] [address]      Provider Number: [id number]  Attending Physician Name and Address:  Hillary Bow, MD  Relative Name and Phone Number:       Current Level of Care: Hospital Recommended Level of Care: [facility], [facility] Care Prior Approval Number:    Date Approved/Denied:   PASRR Number: [id number]  Discharge Plan: Domiciliary (Rest home)(Memory Care)    Current Diagnoses: Primary: Dementia  Patient Active Problem List   Diagnosis Date Noted  . Hypernatremia 02/25/2019  . COPD (chronic obstructive pulmonary disease) ([place]) 12/14/2018  . Closed right hip fracture ([place]) 12/14/2018  . Syncope 12/06/2016  . Iron deficiency anemia due to chronic blood loss 11/27/2016  . Encounter for blood transfusion 10/16/2016  . Angiodysplasia of stomach and duodenum with bleeding 10/16/2016  . Essential thrombocytosis ([place]) 10/16/2016  . SVT (supraventricular tachycardia) ([place]) 10/16/2016  . Iron deficiency anemia 10/14/2016  . Thrombocythemia ([place]) 10/14/2016  . TIA (transient ischemic attack) 10/14/2016  . Encephalopathy acute 03/07/2016  . Pressure ulcer 02/23/2016  . Confusion 02/22/2016  . Agitation 01/20/2016  . Overactive bladder 02/06/2015  . Dementia ([place]) 02/06/2014  . Type 2 diabetes mellitus without complication ([place]) 09/06/3233  . Increased frequency of urination 01/07/2013    Orientation RESPIRATION BLADDER Height & Weight     Self  Normal Incontinent Weight: 80 lb 4.8 oz (36.4 kg) Height:  5\' 7"  (170.2 cm)  BEHAVIORAL SYMPTOMS/MOOD NEUROLOGICAL BOWEL NUTRITION STATUS      Continent Diet(Diet: DYS 2)  AMBULATORY STATUS  COMMUNICATION OF NEEDS Skin   Extensive Assist Verbally PU Stage and Appropriate Care(Pressure Ulcer Stage 2 Sacrum.)                       Personal Care Assistance Level of Assistance  Bathing, Feeding, Dressing Bathing Assistance: Limited assistance Feeding assistance: Limited assistance Dressing Assistance: Limited assistance     Functional Limitations Info  Sight, Hearing, Speech Sight Info: Adequate Hearing Info: Adequate Speech Info: Adequate    SPECIAL CARE FACTORS FREQUENCY                       Contractures      Additional Factors Info  Code Status, Allergies Code Status Info: DNR Allergies Info: No Known Allergies.           Current Medications (02/25/2019):  This is the current hospital active medication list Current Facility-Administered Medications  Medication Dose Route Frequency Provider Last Rate Last Dose  . 0.9 %  sodium chloride infusion  250 mL Intravenous PRN Demetrios Loll, MD      . acetaminophen (TYLENOL) tablet 650 mg  650 mg Oral Q6H PRN Demetrios Loll, MD   650 mg at 02/25/19 5732   Or  . acetaminophen (TYLENOL) suppository 650 mg  650 mg Rectal Q6H PRN Demetrios Loll, MD      . albuterol (PROVENTIL) (2.5 MG/3ML) 0.083% nebulizer solution 2.5 mg  2.5 mg Nebulization Q2H PRN Demetrios Loll, MD      . alum & mag hydroxide-simeth (MAALOX/MYLANTA) 200-200-20 MG/5ML suspension 30 mL  30 mL Oral Daily PRN Demetrios Loll, MD      . aspirin chewable tablet  81 mg  81 mg Oral Daily Demetrios Loll, MD   81 mg at 02/25/19 3094  . bisacodyl (DULCOLAX) EC tablet 5 mg  5 mg Oral Daily PRN Demetrios Loll, MD   5 mg at 02/25/19 0905  . bismuth subsalicylate (PEPTO BISMOL) 262 MG/15ML suspension 15 mL  15 mL Oral Q2H PRN Demetrios Loll, MD      . docusate sodium (COLACE) capsule 100 mg  100 mg Oral BID PRN Demetrios Loll, MD      . feeding supplement (ENSURE ENLIVE) (ENSURE ENLIVE) liquid 237 mL  237 mL Oral TID WC Sudini, Srikar, MD      . fluticasone (FLONASE) 50 MCG/ACT nasal spray 2  spray  2 spray Each Nare Daily Demetrios Loll, MD   2 spray at 02/25/19 0910  . guaiFENesin-dextromethorphan (ROBITUSSIN DM) 100-10 MG/5ML syrup 10 mL  10 mL Oral Q6H PRN Demetrios Loll, MD      . heparin injection 5,000 Units  5,000 Units Subcutaneous Q8H Demetrios Loll, MD   5,000 Units at 02/25/19 1404  . HYDROcodone-acetaminophen (NORCO/VICODIN) 5-325 MG per tablet 1-2 tablet  1-2 tablet Oral Q4H PRN Demetrios Loll, MD      . hydroxyurea (HYDREA) capsule 500 mg  500 mg Oral Daily Demetrios Loll, MD   500 mg at 02/25/19 0905  . insulin aspart (novoLOG) injection 0-5 Units  0-5 Units Subcutaneous QHS Demetrios Loll, MD      . insulin aspart (novoLOG) injection 0-9 Units  0-9 Units Subcutaneous TID WC Demetrios Loll, MD   1 Units at 02/24/19 0935  . loperamide (IMODIUM) capsule 2 mg  2 mg Oral PRN Demetrios Loll, MD      . loratadine (CLARITIN) tablet 10 mg  10 mg Oral Daily Demetrios Loll, MD   10 mg at 02/25/19 0905  . LORazepam (ATIVAN) tablet 0.5 mg  0.5 mg Oral Daily Demetrios Loll, MD   0.5 mg at 02/25/19 1404  . LORazepam (ATIVAN) tablet 0.5 mg  0.5 mg Oral Q6H PRN Demetrios Loll, MD      . memantine (NAMENDA XR) 24 hr capsule 28 mg  28 mg Oral QHS Demetrios Loll, MD   28 mg at 02/24/19 2151  . metoprolol tartrate (LOPRESSOR) tablet 25 mg  25 mg Oral BID Bettey Costa, MD   25 mg at 02/25/19 0905  . mirtazapine (REMERON) tablet 7.5 mg  7.5 mg Oral QHS Demetrios Loll, MD   7.5 mg at 02/24/19 2151  . nystatin (MYCOSTATIN) 100000 UNIT/ML suspension 500,000 Units  5 mL Mouth/Throat QID Hillary Bow, MD   500,000 Units at 02/25/19 1403  . ondansetron (ZOFRAN) tablet 4 mg  4 mg Oral Q6H PRN Demetrios Loll, MD       Or  . ondansetron [facility]) injection 4 mg  4 mg Intravenous Q6H PRN Demetrios Loll, MD      . pantoprazole (PROTONIX) EC tablet 40 mg  40 mg Oral Daily Demetrios Loll, MD   40 mg at 02/25/19 0905  . QUEtiapine (SEROQUEL) tablet 12.5 mg  12.5 mg Oral Daily Demetrios Loll, MD   12.5 mg at 02/25/19 0905  . QUEtiapine (SEROQUEL) tablet 25 mg  25 mg Oral  QHS Demetrios Loll, MD   25 mg at 02/24/19 2152  . senna-docusate (Senokot-S) tablet 1 tablet  1 tablet Oral QHS PRN Demetrios Loll, MD      . sodium chloride flush (NS) 0.9 % injection 3 mL  3 mL Intravenous Q12H Demetrios Loll, MD   3  mL at 02/25/19 0926  . sodium chloride flush (NS) 0.9 % injection 3 mL  3 mL Intravenous PRN Demetrios Loll, MD      . sodium phosphate (FLEET) 7-19 GM/118ML enema 1 enema  1 enema Rectal Daily PRN Demetrios Loll, MD      . traZODone (DESYREL) tablet 25 mg  25 mg Oral Q4H PRN Demetrios Loll, MD         Discharge Medications: Please see discharge summary for a list of discharge medications.  Relevant Imaging Results:  Relevant Lab Results:   Additional Information SSN: [id number]  Armanie Martine, Veronia Beets, LCSW

## 2019-02-26 DIAGNOSIS — E43 Unspecified severe protein-calorie malnutrition: Secondary | ICD-10-CM | POA: Insufficient documentation

## 2019-02-26 DIAGNOSIS — L8990 Pressure ulcer of unspecified site, unspecified stage: Secondary | ICD-10-CM | POA: Insufficient documentation

## 2019-02-26 LAB — GLUCOSE, CAPILLARY
Glucose-Capillary: 123 mg/dL — ABNORMAL HIGH (ref 70–99)
Glucose-Capillary: 150 mg/dL — ABNORMAL HIGH (ref 70–99)

## 2019-02-26 LAB — BASIC METABOLIC PANEL
Anion gap: 6 (ref 5–15)
BUN: 34 mg/dL — ABNORMAL HIGH (ref 8–23)
CO2: 27 mmol/L (ref 22–32)
Calcium: 8.3 mg/dL — ABNORMAL LOW (ref 8.9–10.3)
Chloride: 116 mmol/L — ABNORMAL HIGH (ref 98–111)
Creatinine, Ser: 1.06 mg/dL — ABNORMAL HIGH (ref 0.44–1.00)
GFR calc Af Amer: 55 mL/min — ABNORMAL LOW (ref >60)
GFR calc non Af Amer: 48 mL/min — ABNORMAL LOW (ref >60)
Glucose, Bld: 129 mg/dL — ABNORMAL HIGH (ref 70–99)
Potassium: 3.7 mmol/L (ref 3.5–5.1)
Sodium: 149 mmol/L — ABNORMAL HIGH (ref 135–145)

## 2019-02-26 MED ORDER — LORAZEPAM 0.5 MG PO TABS: 0.5000 mg | ORAL_TABLET | ORAL | Status: DC | PRN

## 2019-02-26 MED ORDER — DOCUSATE SODIUM 100 MG PO CAPS
100.0000 mg | ORAL_CAPSULE | Freq: Two times a day (BID) | ORAL | Status: DC
  Administered 2019-02-26: 09:00:00 100 mg via ORAL
  Filled 2019-02-26: qty 1

## 2019-02-26 MED ORDER — MORPHINE SULFATE (PF) 2 MG/ML IV SOLN: 1.0000 mg | INTRAVENOUS | Status: DC | PRN

## 2019-02-26 MED ORDER — MORPHINE SULFATE (CONCENTRATE) 10 MG/0.5ML PO SOLN: 5.0000 mg | ORAL | Status: AC | PRN

## 2019-02-26 MED ORDER — GLYCOPYRROLATE 0.2 MG/ML IJ SOLN
0.2000 mg | INTRAMUSCULAR | Status: DC | PRN
  Filled 2019-02-26: qty 1

## 2019-02-26 MED ORDER — MORPHINE SULFATE (CONCENTRATE) 10 MG/0.5ML PO SOLN
5.0000 mg | ORAL | Status: DC | PRN
  Administered 2019-02-26 (×2): 5 mg via ORAL
  Filled 2019-02-26 (×2): qty 1

## 2019-02-26 MED ORDER — BIOTENE DRY MOUTH MT LIQD: 15.0000 mL | OROMUCOSAL | Status: DC | PRN

## 2019-02-26 MED ORDER — POLYVINYL ALCOHOL 1.4 % OP SOLN
1.0000 [drp] | Freq: Four times a day (QID) | OPHTHALMIC | Status: DC | PRN
  Filled 2019-02-26: qty 15

## 2019-02-26 MED ORDER — LORAZEPAM 2 MG/ML IJ SOLN: 0.5000 mg | INTRAMUSCULAR | Status: DC | PRN

## 2019-02-26 NOTE — Progress Notes (Signed)
Daily Progress Note   Patient Name: Savannah Young       Date: 02/26/2019 DOB: [birth year]  Age: 85 y.o. MRN#: [id number] Attending Physician: Hillary Bow, MD Primary Care Physician: Raelyn Number, MD Admit Date: 02/23/2019  Reason for Consultation/Follow-up: Establishing goals of care  Subjective: Patient continues to have poor po intake. Remains lethargic. Nurse reports no major events overnight. Condition remains similar to previous days.   I spoke with family at length Savannah Young and Savannah Young). They verbalizes wishes for patient to be kept comfortable and transition care to full comfort. I reviewed comfort care measures with family. They verbalized understanding. Brother expressed wishes for her not to suffer, be kept comfortable and hopefully peaceful. Savannah Young verbalizes her agreement. Family is requesting consideration for patient to be assessed for residential hospice in [facility]. They shared that they have several family members closer to there and also previous experience.   Educated family on the process of referral to hospice home. Family verbalized understanding and appreciation. They are aware they may visit patient if they would like, however, given age and health concerns expressed they may wait until she transfers to hospice.   Length of Stay: 3  Current Medications: Scheduled Meds:  . aspirin  81 mg Oral Daily  . docusate sodium  100 mg Oral BID  . feeding supplement (ENSURE ENLIVE)  237 mL Oral TID WC  . fluticasone  2 spray Each Nare Daily  . heparin  5,000 Units Subcutaneous Q8H  . hydroxyurea  500 mg Oral Daily  . insulin aspart  0-5 Units Subcutaneous QHS  . insulin aspart  0-9 Units Subcutaneous TID WC  . loratadine  10 mg Oral Daily  . LORazepam  0.5 mg Oral  Daily  . memantine  28 mg Oral QHS  . metoprolol tartrate  25 mg Oral BID  . mirtazapine  7.5 mg Oral QHS  . nystatin  5 mL Mouth/Throat QID  . pantoprazole  40 mg Oral Daily  . QUEtiapine  12.5 mg Oral Daily  . QUEtiapine  25 mg Oral QHS  . sodium chloride flush  3 mL Intravenous Q12H    Continuous Infusions: . sodium chloride      PRN Meds: sodium chloride, acetaminophen **OR** acetaminophen, albuterol, alum & mag hydroxide-simeth, bisacodyl, bismuth subsalicylate, docusate sodium, guaiFENesin-dextromethorphan, HYDROcodone-acetaminophen,  loperamide, LORazepam, ondansetron **OR** ondansetron (ZOFRAN) IV, senna-docusate, sodium chloride flush, sodium phosphate, traZODone  Physical Exam         GENERAL: chronically-ill appearing, thin Neurological: confused, dementia   Vital Signs: BP (!) 110/57   Pulse 98   Temp 98.7 F (37.1 C) (Oral)   Resp 16   Ht 5\' 7"  (1.702 m)   Wt 36.4 kg   LMP 01/20/2016 (Approximate)   SpO2 97%   BMI 12.58 kg/m  SpO2: SpO2: 97 % O2 Device: O2 Device: Room Air O2 Flow Rate: O2 Flow Rate (L/min): 2 L/min  Intake/output summary:   Intake/Output Summary (Last 24 hours) at 02/26/2019 1026 Last data filed at 02/26/2019 6568 Gross per 24 hour  Intake 497.03 ml  Output 450 ml  Net 47.03 ml   LBM: Last BM Date: 02/26/19 Baseline Weight: Weight: 45 kg Most recent weight: Weight: 36.4 kg       Palliative Assessment/Data: PPS 10% FULL COMFORT       Patient Active Problem List   Diagnosis Date Noted  . Hypernatremia 02/25/2019  . COPD (chronic obstructive pulmonary disease) ([place]) 12/14/2018  . Closed right hip fracture ([place]) 12/14/2018  . Syncope 12/06/2016  . Iron deficiency anemia due to chronic blood loss 11/27/2016  . Encounter for blood transfusion 10/16/2016  . Angiodysplasia of stomach and duodenum with bleeding 10/16/2016  . Essential thrombocytosis ([place]) 10/16/2016  . SVT (supraventricular tachycardia) ([place]) 10/16/2016  . Iron  deficiency anemia 10/14/2016  . Thrombocythemia ([place]) 10/14/2016  . TIA (transient ischemic attack) 10/14/2016  . Encephalopathy acute 03/07/2016  . Pressure ulcer 02/23/2016  . Confusion 02/22/2016  . Agitation 01/20/2016  . Overactive bladder 02/06/2015  . Dementia ([place]) 02/06/2014  . Type 2 diabetes mellitus without complication ([place]) 12/75/1700  . Increased frequency of urination 01/07/2013    Palliative Care Assessment & Plan   Patient Profile:   Palliative Care consult requested for this 85 y.o. female with multiple medical problems including COPD, diabetes,JAK-2 positive essential thrombocytosis, hypertension, and dementia. She presented to ED from [facility] with complaits of decreased appetite, generalized weakness, and increased confusion. Since admission patient has been treated for suspected UTI and severe hypernatremia (sodium 167). Palliative Medicine team consulted for goals of care discussion.   Recommendations/Plan: DNR/DNI FULL COMFORT Family requesting residential hospice in [facility]. Referral placed.  Will d/c orders not comfort focused  Morphine PRN for pain/air hunger Ativan PRN for agitation/anxiety Robinul PRN for secretions Liquifilm PRN for dry eyes   Goals of Care and Additional Recommendations: Limitations on Scope of Treatment: Full Comfort Care  Code Status:    Code Status Orders  (From admission, onward)         Start     Ordered   02/23/19 2307  Do not attempt resuscitation (DNR)  Continuous    Question Answer Comment  In the event of cardiac or respiratory ARREST Do not call a "code blue"   In the event of cardiac or respiratory ARREST Do not perform Intubation, CPR, defibrillation or ACLS   In the event of cardiac or respiratory ARREST Use medication by any route, position, wound care, and other measures to relive pain and suffering. May use oxygen, suction and manual treatment of airway obstruction as needed for comfort.       02/23/19 2306        Code Status History    Date Active Date Inactive Code Status Order ID Comments User Context   02/23/2019 2232 02/23/2019  2306 Full Code [id number]  Demetrios Loll, MD Inpatient   12/15/2018 0045 12/18/2018 2035 Full Code [id number]  Lance Coon, MD Inpatient   12/06/2016 2024 12/07/2016 2028 Full Code [id number]  Fritzi Mandes, MD Inpatient   10/14/2016 1646 10/16/2016 1842 Full Code [id number]  Vaughan Basta, MD Inpatient   03/07/2016 1420 03/09/2016 1735 Full Code [id number]  Lytle Butte, MD ED   02/22/2016 2010 02/24/2016 1954 Full Code [id number]  Hillary Bow, MD ED   01/20/2016 1947 01/21/2016 1640 Full Code [id number]  Gladstone Lighter, MD ED   Advance Care Planning Activity      Prognosis:  < 2 weeks in the setting of full comfort.   Discharge Planning: Hospice facility  Care plan was discussed with patient's brother, Savannah Young [facility]), niece, Savannah Young ([place]), bedside RN, and Dr. Darvin Neighbours.   Thank you for allowing the Palliative Medicine Team to assist in the care of this patient.  The above conversation was completed via telephone due to the visitor restrictions during the COVID-19 pandemic. Thorough chart review and discussion with necessary members of the care team was completed as part of assessment. All issues were discussed and addressed but no physical exam was performed.  Total Time: 45 min.   Greater than 50%  of this time was spent counseling and coordinating care related to the above assessment and plan.  Alda Lea, AGPCNP-BC Palliative Medicine Team  Phone: [phone] Pager: [phone] Amion: Bjorn Pippin    Please contact Palliative Medicine Team phone at [phone] for questions and concerns.

## 2019-02-26 NOTE — TOC Progression Note (Signed)
Transition of Care [facility]) - Progression Note    Patient Details  Name: Savannah Young MRN: [id number] Date of Birth: 1932-05-23  [facility]) CM/SW Contact  Shade Flood, LCSW Phone Number: 02/26/2019, 12:18 PM  Clinical Narrative:     TOC following. APNP indicates pt's family members have decided on comfort care for pt and they would like referral to [place] [facility] [place]. Referral called [phone]) and requested documentation faxed [phone]). Will await return call from Hospice on whether pt can be transferred today.   Expected Discharge Plan: Assisted Living Barriers to Discharge: Continued Medical Work up  Expected Discharge Plan and Services Expected Discharge Plan: Assisted Living In-house Referral: Clinical Social Work Discharge Planning Services: CM Consult   Living arrangements for the past 2 months: [place]                                       Social Determinants of Health (SDOH) Interventions    Readmission Risk Interventions No flowsheet data found.

## 2019-02-26 NOTE — Progress Notes (Signed)
[facility] [place]:  Spoke to the niece and pt's brother over the phone. Dicussed hospice services and the comfort care philosophy. They do agree and accept the bed offer at [facility]. Pt has been approved by our medical director to come to the facility. The brother is not able to sign paperwork today but has given verbal permission for the niece Mardene Celeste to sign the paperwork for him so that pt can transfer today since she is stable and there is question if she would be if delay. The niece will be at our facility to sign paperwork by 500pm I have spoke to the SW [place] at [facility] and she will arrange transport by ambulance for 500pm or after. Thank you for the opportunity to work with your facility and serve our community at such a trying time. Webb Silversmith RN [phone]

## 2019-02-26 NOTE — TOC Transition Note (Signed)
Transition of Care [facility]) - CM/SW Discharge Note   Patient Details  Name: Savannah Young MRN: [id number] Date of Birth: [birth year]  [facility]) CM/SW Contact:  Shade Flood, LCSW Phone Number: 02/26/2019, 3:03 PM   Clinical Narrative:     Pt transferring to [facility] today. Family to sign admission papers there at 3:30. EMS to transport at 3:30. Updated pt's RN. She will call report. DC clinical will be viewed electronically. Envelope for transfer prepared. There are no other TOC needs at this time.  Final next level of care: [place] Barriers to Discharge: Barriers Resolved   Patient Goals and CMS Choice        Discharge Placement                       Discharge Plan and Services In-house Referral: Clinical Social Work Discharge Planning Services: CM Consult                                 Social Determinants of Health (SDOH) Interventions     Readmission Risk Interventions Readmission Risk Prevention Plan 02/26/2019  Transportation Screening Complete  Medication Review Press photographer) Complete  PCP or Specialist appointment within 3-5 days of discharge Not Complete  PCP/Specialist Appt Not Complete comments Pt going to residential hospice  [place] or [place] Not Complete  [place] or Home Care Consult Pt Refusal Comments pt going to residential hospice  SW Recovery Care/Counseling Consult Complete  [place] Not Applicable  Some recent data might be hidden

## 2019-02-26 NOTE — Progress Notes (Signed)
[place] [facility] [place] NAME: Savannah Young    MR#:  [id number]  DATE OF BIRTH:  [birth year]  SUBJECTIVE:  Patient here from [facility] with weakness and decreased appetite.  Confused.   Poor PO intake  REVIEW OF SYSTEMS:    Unable to obtain due to dementia  DRUG ALLERGIES:  No Known Allergies  VITALS:  Blood pressure (!) 110/57, pulse 98, temperature 98.7 F (37.1 C), temperature source Oral, resp. rate 16, height 5\' 7"  (1.702 m), weight 36.4 kg, last menstrual period 01/20/2016, SpO2 97 %.  PHYSICAL EXAMINATION:  Constitutional: Appears well-developed and well-nourished. No distress. HENT: Normocephalic. Marland Kitchen Oropharynx-thrush Eyes: Conjunctivae and EOM are normal. PERRLA, no scleral icterus.  Neck: Normal ROM. Neck supple. No JVD. No tracheal deviation. CVS: RRR, S1/S2 +, no murmurs, no gallops, no carotid bruit.  Pulmonary: Effort and breath sounds normal, no stridor, rhonchi, wheezes, rales.  Abdominal: Soft. BS +,  no distension, tenderness, rebound or guarding.  Musculoskeletal: Normal range of motion. No edema and no tenderness.  Neuro: Lethargic  skin: Skin is warm and dry. No rash noted. Psychiatric: Dementia, confused   LABORATORY PANEL:   CBC Recent Labs  Lab 02/24/19 0446  WBC 8.4  HGB 11.0*  HCT 37.4  PLT 772*   ------------------------------------------------------------------------------------------------------------------  Chemistries  Recent Labs  Lab 02/23/19 1617  02/26/19 0517  NA 167*   < > 149*  K 4.2   < > 3.7  CL >130*   < > 116*  CO2 26   < > 27  GLUCOSE 130*   < > 129*  BUN 68*   < > 34*  CREATININE 1.57*   < > 1.06*  CALCIUM 9.3   < > 8.3*  AST 30  --   --   ALT 17  --   --   ALKPHOS 110  --   --   BILITOT 0.6  --   --    < > = values in this interval not displayed.    ------------------------------------------------------------------------------------------------------------------  Cardiac Enzymes No results for input(s): TROPONINI in the last 168 hours. ------------------------------------------------------------------------------------------------------------------  RADIOLOGY:  No results found.   ASSESSMENT AND PLAN:   85 year old female with severe dementia who presented from [place] with generalized weakness and found to have severe hypernatremia.  * Hypernatremia with severe dehydration secondary to poor oral intake in setting of dementia Patient was on D5 and sodium had improved but still hypernatremic. Family requested to stop IVF and transition to comfort care. Discussed with palliative care team.  *Acute kidney injury.  Improved with IV fluids.   *  Dementia: was on Namenda, Seroquel and Remeron  *  Essential hypertension: metoprolol  Transition to comfort care and transfer to hospice home.  CODE STATUS: DNR  TOTAL TIME TAKING CARE OF THIS PATIENT: 35 minutes.   Leia Alf Antionio Negron M.D on 02/26/2019 at 12:24 PM  Between 7am to 6pm - Pager - [phone]  After 6pm go to [email or web address] - password [email or web address] [place] [facility]  Office  [phone]  CC: Primary care physician; Raelyn Number, MD  Note: This dictation was prepared with Dragon dictation along with smaller phrase technology. Any transcriptional errors that result from this process are unintentional.

## 2019-02-26 NOTE — Discharge Summary (Signed)
[facility] [place] NAME: Savannah Young    MR#:  [id number]  DATE OF BIRTH:  February 04, 1932  DATE OF ADMISSION:  02/23/2019 ADMITTING PHYSICIAN: Demetrios Loll, MD  DATE OF DISCHARGE: 02/26/2019  PRIMARY CARE PHYSICIAN: Raelyn Number, MD   ADMISSION DIAGNOSIS:  Dehydration [E86.0] Hypernatremia [E87.0] Thrombocytopenia ([place]) [D69.6]  DISCHARGE DIAGNOSIS:  Principal Problem:   Hypernatremia Active Problems:   Protein-calorie malnutrition, severe   Pressure injury of skin   SECONDARY DIAGNOSIS:   Past Medical History:  Diagnosis Date  . COPD (chronic obstructive pulmonary disease) ([place])   . Dementia ([place])   . Diabetes mellitus ([place])   . Hypertension      ADMITTING HISTORY  Savannah Young  is a 85 y.o. female with a known history of COPD, hypertension, diabetes and dementia.  The patient is sent from nursing home to ED due to above chief complaints.  She is found decreased appetite, generalized weakness and confusion.  She suspected UTI and sent to ED for further evaluation.  In the ED, she is found severe hyponatremia with sodium level at 167.  ED NP request admission  HOSPITAL COURSE:   85 year old female with severe dementia who presented from [facility] with generalized weakness and found to have severe hypernatremia.  * Hypernatremia with severe dehydration secondary to poor oral intake in setting of dementia Patient was started on D5.  Sodium improved but patient did not improve with her mental status much.  Had poor oral intake.  Palliative care was consulted and after discussing with family, family decided to transition patient to comfort care and transferred to hospice home.  At this time patient qualifies for transfer to hospice home for end-of-life care.  *Acute kidney injury.  Improved with IV fluids.   *  Dementia: was on Namenda, Seroquel and Remeron  *  Essential hypertension: metoprolol  Patient will need morphine  PRN.  CONSULTS OBTAINED:  Palliative care  DRUG ALLERGIES:  No Known Allergies  DISCHARGE MEDICATIONS:   Allergies as of 02/26/2019   No Known Allergies     Medication List    STOP taking these medications   acetaminophen 325 MG tablet Commonly known as: TYLENOL   aspirin 81 MG chewable tablet   atorvastatin 20 MG tablet Commonly known as: Lipitor   bismuth subsalicylate 010 XN/23FT suspension Commonly known as: PEPTO BISMOL   docusate sodium 100 MG capsule Commonly known as: COLACE   enoxaparin 30 MG/0.3ML injection Commonly known as: LOVENOX   ferrous gluconate 240 (27 FE) MG tablet Commonly known as: FERGON   fluticasone 50 MCG/ACT nasal spray Commonly known as: FLONASE   hydroxyurea 500 MG capsule Commonly known as: HYDREA   loperamide 2 MG capsule Commonly known as: IMODIUM   loratadine 10 MG tablet Commonly known as: CLARITIN   LORazepam 0.5 MG tablet Commonly known as: ATIVAN   Melatonin 3 MG Tabs   metoprolol tartrate 25 MG tablet Commonly known as: LOPRESSOR   Milk of Magnesia 400 MG/5ML suspension Generic drug: magnesium hydroxide   Mintox 732-202-54 MG/5ML suspension Generic drug: alum & mag hydroxide-simeth   mirtazapine 15 MG tablet Commonly known as: REMERON   Namenda XR 28 MG Cp24 24 hr capsule Generic drug: memantine   pantoprazole 40 MG tablet Commonly known as: PROTONIX   polyethylene glycol 17 g packet Commonly known as: MIRALAX / GLYCOLAX   QUEtiapine 25 MG tablet Commonly known as: SEROQUEL   Robitussin To Go Cough/Cold CF 5-10-100  MG/5ML Liqd Generic drug: Phenylephrine-DM-GG   sodium phosphate 7-19 GM/118ML Enem   traMADol 50 MG tablet Commonly known as: ULTRAM   traZODone 50 MG tablet Commonly known as: DESYREL     TAKE these medications   morphine CONCENTRATE 10 MG/0.5ML Soln concentrated solution Take 0.25 mLs (5 mg total) by mouth every 2 (two) hours as needed for moderate pain, severe pain or  shortness of breath.       Today   VITAL SIGNS:  Blood pressure (!) 110/57, pulse 98, temperature 98.7 F (37.1 C), temperature source Oral, resp. rate 16, height 5\' 7"  (1.702 m), weight 36.4 kg, last menstrual period 01/20/2016, SpO2 97 %.  I/O:    Intake/Output Summary (Last 24 hours) at 02/26/2019 1440 Last data filed at 02/26/2019 0923 Gross per 24 hour  Intake 234.39 ml  Output 200 ml  Net 34.39 ml    PHYSICAL EXAMINATION:  Physical Exam  GENERAL:  85 y.o.-year-old patient lying in the bed with no acute distress.  LUNGS: Normal breath sounds bilaterally CARDIOVASCULAR: S1, S2 ABDOMEN: Soft, non-tender PSYCHIATRIC: The patient is drowzy  DATA REVIEW:   CBC Recent Labs  Lab 02/24/19 0446  WBC 8.4  HGB 11.0*  HCT 37.4  PLT 772*    Chemistries  Recent Labs  Lab 02/23/19 1617  02/26/19 0517  NA 167*   < > 149*  K 4.2   < > 3.7  CL >130*   < > 116*  CO2 26   < > 27  GLUCOSE 130*   < > 129*  BUN 68*   < > 34*  CREATININE 1.57*   < > 1.06*  CALCIUM 9.3   < > 8.3*  AST 30  --   --   ALT 17  --   --   ALKPHOS 110  --   --   BILITOT 0.6  --   --    < > = values in this interval not displayed.    Cardiac Enzymes No results for input(s): TROPONINI in the last 168 hours.  Microbiology Results  Results for orders placed or performed during the hospital encounter of 02/23/19  Urine culture     Status: None   Collection Time: 02/23/19  4:17 PM   Specimen: Urine, Catheterized  Result Value Ref Range Status   Specimen Description   Final    URINE, CATHETERIZED Performed at [facility], [address], [place], [place] [address]    Special Requests   Final    NONE Performed at [facility] [address] [place], [place] [address]    Culture   Final    NO GROWTH Performed at [facility], [place] [address], [place],  [address]    Report Status 02/25/2019 FINAL  Final  SARS Coronavirus 2 (CEPHEID- Performed in [facility] hospital lab), Hosp Order     Status: None   Collection Time: 02/23/19  8:43 PM   Specimen: Nasopharyngeal Swab  Result Value Ref Range Status   SARS Coronavirus 2 NEGATIVE NEGATIVE Final    Comment: (NOTE) If result is NEGATIVE SARS-CoV-2 target nucleic acids are NOT DETECTED. The SARS-CoV-2 RNA is generally detectable in upper and lower  respiratory specimens during the acute phase of infection. The lowest  concentration of SARS-CoV-2 viral copies this assay can detect is 250  copies / mL. A negative result does not preclude SARS-CoV-2 infection  and should not be used as the sole basis for treatment  or other  patient management decisions.  A negative result may occur with  improper specimen collection / handling, submission of specimen other  than nasopharyngeal swab, presence of viral mutation(s) within the  areas targeted by this assay, and inadequate number of viral copies  (<250 copies / mL). A negative result must be combined with clinical  observations, patient history, and epidemiological information. If result is POSITIVE SARS-CoV-2 target nucleic acids are DETECTED. The SARS-CoV-2 RNA is generally detectable in upper and lower  respiratory specimens dur ing the acute phase of infection.  Positive  results are indicative of active infection with SARS-CoV-2.  Clinical  correlation with patient history and other diagnostic information is  necessary to determine patient infection status.  Positive results do  not rule out bacterial infection or co-infection with other viruses. If result is PRESUMPTIVE POSTIVE SARS-CoV-2 nucleic acids MAY BE PRESENT.   A presumptive positive result was obtained on the submitted specimen  and confirmed on repeat testing.  While 2019 novel coronavirus  (SARS-CoV-2) nucleic acids may be present in the submitted sample  additional confirmatory testing may be necessary for epidemiological  and / or clinical management purposes  to  differentiate between  SARS-CoV-2 and other Sarbecovirus currently known to infect humans.  If clinically indicated additional testing with an alternate test  methodology [phone]) is advised. The SARS-CoV-2 RNA is generally  detectable in upper and lower respiratory sp ecimens during the acute  phase of infection. The expected result is Negative. Fact Sheet for Patients:  StrictlyIdeas.no Fact Sheet for Healthcare Providers: BankingDealers.co.za This test is not yet approved or cleared by the [place] FDA and has been authorized for detection and/or diagnosis of SARS-CoV-2 by FDA under an Emergency Use Authorization (EUA).  This EUA will remain in effect (meaning this test can be used) for the duration of the COVID-19 declaration under Section 564(b)(1) of the Act, 21 U.S.C. section 360bbb-3(b)(1), unless the authorization is terminated or revoked sooner. Performed at [facility], [address] [place], [place] [address]   MRSA PCR Screening     Status: None   Collection Time: 02/24/19  7:02 AM   Specimen: Nasal Mucosa; Nasopharyngeal  Result Value Ref Range Status   MRSA by PCR NEGATIVE NEGATIVE Final    Comment:        The GeneXpert MRSA Assay (FDA approved for NASAL specimens only), is one component of a comprehensive MRSA colonization surveillance program. It is not intended to diagnose MRSA infection nor to guide or monitor treatment for MRSA infections. Performed at [facility], [address] [place], [place] [address]     RADIOLOGY:  No results found.  Follow up with PCP in 1 week.  Management plans discussed with the patient, family and they are in agreement.  CODE STATUS:     Code Status Orders  (From admission, onward)         Start     Ordered   02/26/19 1050  Do not attempt resuscitation (DNR)  Continuous    Question Answer Comment  In the event of cardiac or respiratory  ARREST Do not call a "code blue"   In the event of cardiac or respiratory ARREST Do not perform Intubation, CPR, defibrillation or ACLS   In the event of cardiac or respiratory ARREST Use medication by any route, position, wound care, and other measures to relive pain and suffering. May use oxygen, suction and manual treatment of airway obstruction as needed for comfort.  02/26/19 1051        Code Status History    Date Active Date Inactive Code Status Order ID Comments User Context   02/23/2019 2306 02/26/2019 1051 DNR [id number]  Lance Coon, MD Inpatient   02/23/2019 2232 02/23/2019 2306 Full Code [id number]  Demetrios Loll, MD Inpatient   12/15/2018 0045 12/18/2018 2035 Full Code [id number]  Lance Coon, MD Inpatient   12/06/2016 2024 12/07/2016 2028 Full Code [id number]  Fritzi Mandes, MD Inpatient   10/14/2016 1646 10/16/2016 1842 Full Code [id number]  Vaughan Basta, MD Inpatient   03/07/2016 1420 03/09/2016 1735 Full Code [id number]  Lytle Butte, MD ED   02/22/2016 2010 02/24/2016 1954 Full Code [id number]  Hillary Bow, MD ED   01/20/2016 1947 01/21/2016 1640 Full Code [id number]  Gladstone Lighter, MD ED   Advance Care Planning Activity      TOTAL TIME TAKING CARE OF THIS PATIENT ON DAY OF DISCHARGE: more than 30 minutes.   Leia Alf Kymani Shimabukuro M.D on 02/26/2019 at 2:40 PM  Between 7am to 6pm - Pager - [phone]  After 6pm go to [email or web address] - password [email or web address] [facility]  Office  [phone]  CC: Primary care physician; Raelyn Number, MD  Note: This dictation was prepared with Dragon dictation along with smaller phrase technology. Any transcriptional errors that result from this process are unintentional.

## 2019-02-26 NOTE — Care Management Important Message (Signed)
Important Message  Patient Details  Name: MALEIGH BAGOT MRN: [id number] Date of Birth: Jun 17, 1932   Medicare Important Message Given:  Other (see comment)  Talked with niece, Josephine Cables [phone] by phone and reviewed Important Message from Medicare with her as the patient was confused and unable to sign.  Ms. Marshell Levan is in agreement with the planned discharge to [facility] and noted on the form. I will mail her a copy of the form to:  [address] [place], [place] [address].  Juliann Pulse A Czar Ysaguirre 02/26/2019, 2:23 PM
